# Patient Record
Sex: Female | Born: 1950 | Race: Black or African American | Hispanic: No | Marital: Single | Smoking: Never smoker
Health system: Southern US, Community
[De-identification: ages and names within clinical notes are randomized; demographics above are authoritative.]

## PROBLEM LIST (undated history)

## (undated) DIAGNOSIS — T7840XA Allergy, unspecified, initial encounter: Secondary | ICD-10-CM

## (undated) DIAGNOSIS — M1990 Unspecified osteoarthritis, unspecified site: Secondary | ICD-10-CM

## (undated) DIAGNOSIS — J45909 Unspecified asthma, uncomplicated: Secondary | ICD-10-CM

## (undated) DIAGNOSIS — E119 Type 2 diabetes mellitus without complications: Secondary | ICD-10-CM

## (undated) HISTORY — PX: BREAST SURGERY: SHX581

## (undated) HISTORY — DX: Allergy, unspecified, initial encounter: T78.40XA

## (undated) HISTORY — DX: Unspecified osteoarthritis, unspecified site: M19.90

## (undated) HISTORY — DX: Unspecified asthma, uncomplicated: J45.909

## (undated) HISTORY — DX: Type 2 diabetes mellitus without complications: E11.9

---

## 1999-03-12 ENCOUNTER — Encounter: Admission: RE | Admit: 1999-03-12 | Discharge: 1999-03-12 | Payer: Self-pay | Admitting: Gastroenterology

## 1999-03-12 ENCOUNTER — Encounter: Payer: Self-pay | Admitting: Gastroenterology

## 1999-05-24 ENCOUNTER — Other Ambulatory Visit: Admission: RE | Admit: 1999-05-24 | Discharge: 1999-05-24 | Payer: Self-pay | Admitting: Obstetrics and Gynecology

## 1999-11-25 ENCOUNTER — Encounter: Payer: Self-pay | Admitting: Obstetrics and Gynecology

## 1999-11-25 ENCOUNTER — Encounter: Admission: RE | Admit: 1999-11-25 | Discharge: 1999-11-25 | Payer: Self-pay | Admitting: Obstetrics and Gynecology

## 2000-07-03 ENCOUNTER — Other Ambulatory Visit: Admission: RE | Admit: 2000-07-03 | Discharge: 2000-07-03 | Payer: Self-pay | Admitting: Obstetrics and Gynecology

## 2000-11-12 ENCOUNTER — Encounter: Admission: RE | Admit: 2000-11-12 | Discharge: 2000-11-12 | Payer: Self-pay | Admitting: Obstetrics and Gynecology

## 2000-11-12 ENCOUNTER — Encounter: Payer: Self-pay | Admitting: Obstetrics and Gynecology

## 2001-11-15 ENCOUNTER — Encounter: Payer: Self-pay | Admitting: Obstetrics and Gynecology

## 2001-11-15 ENCOUNTER — Encounter: Admission: RE | Admit: 2001-11-15 | Discharge: 2001-11-15 | Payer: Self-pay | Admitting: Obstetrics and Gynecology

## 2001-11-19 ENCOUNTER — Encounter: Admission: RE | Admit: 2001-11-19 | Discharge: 2001-11-19 | Payer: Self-pay | Admitting: Obstetrics and Gynecology

## 2001-11-19 ENCOUNTER — Encounter: Payer: Self-pay | Admitting: Obstetrics and Gynecology

## 2001-12-07 ENCOUNTER — Other Ambulatory Visit: Admission: RE | Admit: 2001-12-07 | Discharge: 2001-12-07 | Payer: Self-pay | Admitting: Obstetrics and Gynecology

## 2002-05-05 HISTORY — PX: COLONOSCOPY: SHX174

## 2002-11-21 ENCOUNTER — Ambulatory Visit (HOSPITAL_COMMUNITY): Admission: RE | Admit: 2002-11-21 | Discharge: 2002-11-21 | Payer: Self-pay | Admitting: Gastroenterology

## 2002-11-22 ENCOUNTER — Encounter: Admission: RE | Admit: 2002-11-22 | Discharge: 2002-11-22 | Payer: Self-pay | Admitting: Obstetrics and Gynecology

## 2002-11-22 ENCOUNTER — Encounter: Payer: Self-pay | Admitting: Obstetrics and Gynecology

## 2003-06-07 ENCOUNTER — Other Ambulatory Visit: Admission: RE | Admit: 2003-06-07 | Discharge: 2003-06-07 | Payer: Self-pay | Admitting: Obstetrics & Gynecology

## 2003-12-01 ENCOUNTER — Encounter: Admission: RE | Admit: 2003-12-01 | Discharge: 2003-12-01 | Payer: Self-pay | Admitting: Obstetrics and Gynecology

## 2003-12-22 ENCOUNTER — Encounter: Admission: RE | Admit: 2003-12-22 | Discharge: 2003-12-22 | Payer: Self-pay | Admitting: Obstetrics and Gynecology

## 2004-06-27 ENCOUNTER — Other Ambulatory Visit: Admission: RE | Admit: 2004-06-27 | Discharge: 2004-06-27 | Payer: Self-pay | Admitting: Obstetrics and Gynecology

## 2004-09-24 ENCOUNTER — Ambulatory Visit (HOSPITAL_COMMUNITY): Admission: RE | Admit: 2004-09-24 | Discharge: 2004-09-24 | Payer: Self-pay | Admitting: Obstetrics and Gynecology

## 2004-09-24 ENCOUNTER — Encounter (INDEPENDENT_AMBULATORY_CARE_PROVIDER_SITE_OTHER): Payer: Self-pay | Admitting: *Deleted

## 2004-09-26 ENCOUNTER — Emergency Department (HOSPITAL_COMMUNITY): Admission: EM | Admit: 2004-09-26 | Discharge: 2004-09-26 | Payer: Self-pay | Admitting: Emergency Medicine

## 2005-01-23 ENCOUNTER — Encounter: Admission: RE | Admit: 2005-01-23 | Discharge: 2005-01-23 | Payer: Self-pay | Admitting: Obstetrics and Gynecology

## 2006-01-07 ENCOUNTER — Ambulatory Visit (HOSPITAL_COMMUNITY): Admission: RE | Admit: 2006-01-07 | Discharge: 2006-01-07 | Payer: Self-pay | Admitting: Otolaryngology

## 2006-01-13 ENCOUNTER — Encounter: Admission: RE | Admit: 2006-01-13 | Discharge: 2006-01-13 | Payer: Self-pay | Admitting: Otolaryngology

## 2006-02-06 ENCOUNTER — Ambulatory Visit: Payer: Self-pay | Admitting: Internal Medicine

## 2006-02-10 ENCOUNTER — Ambulatory Visit: Payer: Self-pay | Admitting: Internal Medicine

## 2006-02-25 ENCOUNTER — Encounter: Admission: RE | Admit: 2006-02-25 | Discharge: 2006-02-25 | Payer: Self-pay | Admitting: Obstetrics and Gynecology

## 2006-05-07 ENCOUNTER — Ambulatory Visit: Payer: Self-pay | Admitting: Internal Medicine

## 2006-05-11 ENCOUNTER — Ambulatory Visit (HOSPITAL_COMMUNITY): Admission: RE | Admit: 2006-05-11 | Discharge: 2006-05-11 | Payer: Self-pay | Admitting: Internal Medicine

## 2007-01-23 ENCOUNTER — Emergency Department (HOSPITAL_COMMUNITY): Admission: EM | Admit: 2007-01-23 | Discharge: 2007-01-23 | Payer: Self-pay | Admitting: Emergency Medicine

## 2007-02-14 ENCOUNTER — Emergency Department (HOSPITAL_COMMUNITY): Admission: EM | Admit: 2007-02-14 | Discharge: 2007-02-15 | Payer: Self-pay | Admitting: *Deleted

## 2007-02-17 ENCOUNTER — Emergency Department (HOSPITAL_COMMUNITY): Admission: EM | Admit: 2007-02-17 | Discharge: 2007-02-17 | Payer: Self-pay | Admitting: Emergency Medicine

## 2007-03-08 ENCOUNTER — Ambulatory Visit (HOSPITAL_COMMUNITY): Payer: Self-pay | Admitting: Psychiatry

## 2007-03-19 ENCOUNTER — Emergency Department (HOSPITAL_COMMUNITY): Admission: EM | Admit: 2007-03-19 | Discharge: 2007-03-19 | Payer: Self-pay | Admitting: Family Medicine

## 2007-03-22 ENCOUNTER — Ambulatory Visit (HOSPITAL_COMMUNITY): Payer: Self-pay | Admitting: Psychiatry

## 2007-05-10 ENCOUNTER — Ambulatory Visit (HOSPITAL_COMMUNITY): Payer: Self-pay | Admitting: Psychiatry

## 2007-07-12 ENCOUNTER — Ambulatory Visit (HOSPITAL_COMMUNITY): Payer: Self-pay | Admitting: Psychiatry

## 2007-10-11 ENCOUNTER — Ambulatory Visit (HOSPITAL_COMMUNITY): Payer: Self-pay | Admitting: Psychiatry

## 2008-01-31 ENCOUNTER — Ambulatory Visit (HOSPITAL_COMMUNITY): Payer: Self-pay | Admitting: Psychiatry

## 2008-05-31 ENCOUNTER — Ambulatory Visit (HOSPITAL_COMMUNITY): Payer: Self-pay | Admitting: Psychiatry

## 2010-05-26 ENCOUNTER — Encounter: Payer: Self-pay | Admitting: Obstetrics and Gynecology

## 2010-09-20 NOTE — Assessment & Plan Note (Signed)
[facility]                               PULMONARY OFFICE NOTE   NAME:Lindsey Bray, Lindsey Bray                      MRN:          [id number]  DATE:02/06/2006                            DOB:          1950-08-15    PROBLEM:  Pulmonary consultation with the kind request of Dr. Jenne Pane for this  60 year old woman who is concerned that she is seeing some pink, possibly  blood in mucus she clears from her throat.   HISTORY:  She says this has been going on at least two years. What she  describes is a foreign-body sensation low in her throat. She works at it  hard and eventually brings up a small lump the size of her fingernail which  she describes as mucus with a little red in it. She has only occasional  cough otherwise, which she considers minimal. She denies any reflux symptoms  or difficulties swallowing. She describes workup by Dr. Jenne Pane including a  chest x-ray she understands was normal and a rhinoscopy. She also had what  sounds like a pH probe. She had asthma as a child. Otherwise no respiratory  complaints. She has had no other bleeding. She has never smoked.   MEDICATIONS:  Mucinex which she was using irregularly.   No medication allergy.   REVIEW OF SYSTEMS:  Her weight has bene stable. She denies adenopathy,  fever, or rash. She has had a few night sweats she considers menopausal. She  jogs regularly with no shortness of breath. She denies any food hang up or  throat pain or any sense of aspiration. There has been no edema or change in  bowel or bladder.   PAST HISTORY:  Still has tonsils. No history of pneumonia. Asthma only as a  child. No history of acid peptic disease, anemia or transfusion. No history  of liver disease, kidney dysfunction, or other serious illness. Benign  breast lump resected as a teenager. No history of exposure to tuberculosis.   SOCIAL HISTORY:  She is not married. 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   FAMILY HISTORY:  Nobody with bleeding or clotting disorders. Father had  emphysema. Sister and mother had cancers.   OBJECTIVE:  Weight 140 pounds, BP 122/72, pulse regular/61, room-air  saturation 100% at rest. This is a well-developed, well-nourished, African-  American woman who looks fit and comfortable.  SKIN:  No rash on exposed areas.  ADENOPATHY:  None found at the neck, shoulders or axillae.  HEENT:  Voice quality normal. Cerumen obscures left TM; right ear is okay.  Nose and throat are clear. There is no stridor or thyromegaly.  CHEST:  Quiet, clear lung sounds. No cough or wheeze. No throat clearing  noted.  HEART:  Sounds regular without murmur or gallop.  ABDOMEN:  Without enlargement of liver or spleen.  EXTREMITIES:  Without cyanosis, clubbing, edema or telangiectases.   IMPRESSION:  Her concern is rather specific, describing small pieces of  mucus that seem to collect in her throat with a foreign-body sensation that  she has to mechanically work  at by coughing to clear. It is only with that  effort that she sees a little pink, suggesting that she may just be getting  a little capillary rupture as she brings up very small amounts of mucus. I  do not know if she is just sensitive in her throat or has a small  diverticulum that be might be seen either with a CT scan of her neck or a  with barium swallow. She has some Mucinex but has not been using it  effectively, and we discussed the possibility it might produce symptoms  enough that the problem would be manageable. For now, I will address this as  hemoptysis but think it is likely the hypopharynx, upper airway, or  esophagitis that is the source, and it will probably be benign in this  nonsmoker.   PLAN:  1. Increase Mucinex to b.i.d. on a regular basis as discussed. Try      increasing fluids and exposure to steam in shower.  2. We are getting a CT scan of her neck.  3. Schedule return in 3 weeks with  question of barium swallow or possibly      eventually bronchoscopy.   I appreciate the chance to meet her and would be happy to discuss her care.       Clinton D. Maple Hudson, MD, [facility], FACP      CDY/MedQ  DD:  02/08/2006  DT:  02/09/2006  Job #:  [id number]   cc:   Antony Contras, MD

## 2010-09-20 NOTE — Op Note (Signed)
NAME:  Lindsey Bray, BEDORE NO.:  [id number]   MEDICAL RECORD NO.:  [id number]          PATIENT TYPE:  AMB   LOCATION:  SDC                           FACILITY:  [facility]   PHYSICIAN:  Miguel Aschoff, M.D.       DATE OF BIRTH:  1950-07-13   DATE OF PROCEDURE:  09/24/2004  DATE OF DISCHARGE:                                 OPERATIVE REPORT   PREOPERATIVE DIAGNOSES:  Irregular vaginal bleeding, rule out endometrial  neoplasia.   POSTOPERATIVE DIAGNOSES:  Irregular vaginal bleeding, rule out endometrial  neoplasia.   PROCEDURE:  Cervical dilatation, hysteroscopy, uterine curettage.   SURGEON:  Miguel Aschoff, M.D.   ANESTHESIA:  General.   COMPLICATIONS:  None.   JUSTIFICATION:  The patient is 60 year old black female with history of  irregular vaginal bleeding initially thought to be due to anovulatory cycles  perimenopause but the patient did not respond to __________ gestational  therapy. She presents now to undergo hysteroscopy D&C to see if an etiology  for the bleeding can be established and corrected. The risks and benefits of  the procedure were discussed with the patient.   DESCRIPTION OF PROCEDURE:  The patient was taken to the operating room,  placed in supine position, . general anesthesia was administered without  difficulty. She was then placed in dorsolithotomy position, prepped and  draped in the usual sterile fashion.  After this was done, examination was  carried out which revealed normal external genitalia, normal Bartholin and  Skene's glands, normal urethra. The vaginal vault was without gross lesion.  The cervix had a small pin point os but was otherwise was without gross  lesion. The uterus was noted to be anterior, normal size and shape. The  adnexa revealed no masses. At this point, the cervix grasped with the  tenaculum. The endocervical canal was dilated with serial Pratt dilators  until a number 25 Pratt dilator could be passed then the hysteroscope  was  advanced under direct visualization.  No endocervical lesions were noted.  The cavity of the uterus appeared somewhat atrophic, no submucous myomas or  other lesions were noted within the confines of the uterus. At this point,  the hysteroscope was removed and curettage was carried out using a small  serrated curette. A small amount of tissue was obtained and sent for  histologic study this being inconsistent with the patient's atrophic  appearing endometrium. At this point with no other abnormalities being  noted, all instruments were removed. Hemostasis was barely achieved. The  patient reversed of the anesthetic and sent to the recovery room in  satisfactory condition.  The estimated blood loss was less than 10 mL, the  deficit of hysteroscopy was less than 80 mL.   PLAN:  Plan is for the patient to be discharged home.   DISCHARGE MEDICATIONS:  1.  Darvocet-N 100, 1 every 4 hours as needed for pain.  2.  Doxycycline 100 mg twice a day x3 days.   The patient is call for pathology report on Sep 26, 2004. She is to call if  there are any problems  such as fever, pain or heavy bleeding.       AR/MEDQ  D:  09/24/2004  T:  09/24/2004  Job:  [id number]

## 2010-09-20 NOTE — Op Note (Signed)
   NAME:  ADRINNE, Lindsey Bray                         ACCOUNT NO.:  [id number]   MEDICAL RECORD NO.:  [id number]                   PATIENT TYPE:  AMB   LOCATION:  ENDO                                 FACILITY:  [facility]   PHYSICIAN:  Danise Edge, M.D.                DATE OF BIRTH:  1950/09/28   DATE OF PROCEDURE:  11/21/2002  DATE OF DISCHARGE:                                 OPERATIVE REPORT   PROCEDURE:  Screening colonoscopy.   PROCEDURE INDICATIONS:  [name] is a 60 year old female born [birth year].  Lindsey Bray is scheduled to undergo her first screening colonoscopy  with polypectomy to prevent colon cancer.  Lindsey Bray was diagnosed  with colon cancer at age 78.   ENDOSCOPIST:  Danise Edge, M.D.   PREMEDICATION:  Versed 5 mg, Demerol 50 mg.   PROCEDURE:  After obtaining informed consent, [name] was placed in the  left lateral decubitus position.  I administered intravenous Demerol and  intravenous Versed to achieve conscious sedation for the procedure.  The  patient's blood pressure, oxygen saturation, and cardiac rhythm were  monitored throughout the procedure and documented in the medical record.   Anal inspection was normal.  Digital rectal exam was normal.  The Olympus  adjustable pediatric colonoscope was introduced into the rectum and easily  advanced to the cecum.  Colonic preparation for the exam today was  excellent.   Rectum normal.   Sigmoid colon and descending colon normal.   Splenic flexure normal.   Transverse colon normal.   Hepatic flexure normal.   Ascending colon normal.   Cecum and ileocecal valve normal.    ASSESSMENT:  Normal screening proctocolonoscopy to the cecum.  No endoscopic  evidence for the presence of colorectal neoplasia.   RECOMMENDATIONS:  Repeat colonoscopy in five to 10 years.                                               Danise Edge, M.D.    MJ/MEDQ  D:  11/21/2002  T:  11/21/2002  Job:   [id number]   cc:   Georgann Housekeeper, M.D.  [address]  [place]  [place] [address]  Fax: [phone]

## 2010-09-20 NOTE — Assessment & Plan Note (Signed)
[facility]                             PULMONARY OFFICE NOTE   NAME:Lindsey Bray, Lindsey Bray                      MRN:          [id number]  DATE:05/07/2006                            DOB:          Aug 04, 1950    PROBLEM:  Hemoptysis/blood from mouth.   HISTORY:  Since here in October, she says she still can clear a little  mucus from her throat after eating.  She has to work at it, and when  it comes up there may be a small streak of blood.  She has no problems  with her voice.  No difficulty swallowing.  No obvious sinus drainage.  Only occasional minor cough.  No recognized bleeding or adenopathy.  A  CT scan of the neck done without contrast on February 10, 2006 was normal.  IV contrast had not been given.  Standard barium swallow was recommended  if symptoms continued.   MEDICATIONS:  Mucinex.   NO MEDICATION ALLERGY.   OBJECTIVE:  Weight 145 pounds.  BP 130/72.  Pulse regular 63.  Room air  saturation 100%.  Nose and throat look clear.  Her oropharynx is clear and voice normal.  There is no stridor, neck vein distention or adenopathy.  HEART:  Sounds are regular without murmur.  LUNGS:  Clear.   IMPRESSION:  Blood from mouth.  It almost sounds as if she might be  working a little something up out of her diverticulum occasionally.   PLAN:  Barium swallow.  Call report.  I will see her again p.r.n. unless  the pattern changes, recognizing that she has already had appropriate  ENT evaluation.  I told her that about all we would be able to do would  be to watch this conservatively, but that it was important for her to  notice any progression or additional findings.     Clinton D. Maple Hudson, MD, [facility], FACP  Electronically Signed    CDY/MedQ  DD: 05/09/2006  DT: 05/09/2006  Job #: [id number]   cc:   Antony Contras, MD

## 2011-02-12 LAB — I-STAT 8, (EC8 V) (CONVERTED LAB)
BUN: 9
Bicarbonate: 31.9 — ABNORMAL HIGH
Chloride: 102
HCT: 45
Sodium: 139
TCO2: 33
pCO2, Ven: 52.3 — ABNORMAL HIGH

## 2011-02-13 LAB — POCT I-STAT CREATININE
Creatinine, Ser: 1.5 — ABNORMAL HIGH
Operator id: 151321
Operator id: 196461

## 2011-02-13 LAB — I-STAT 8, (EC8 V) (CONVERTED LAB)
BUN: 15
Bicarbonate: 27.7 — ABNORMAL HIGH
Chloride: 100
HCT: 47 — ABNORMAL HIGH
Hemoglobin: 15.6 — ABNORMAL HIGH
Hemoglobin: 16 — ABNORMAL HIGH
Operator id: 151321
Operator id: 196461
Potassium: 3.9
Sodium: 137
TCO2: 36
TCO2: 29
pCO2, Ven: 46.6
pH, Ven: 7.382 — ABNORMAL HIGH

## 2011-02-13 LAB — CBC
HCT: 40.2
MCHC: 33.6
Platelets: 266
RBC: 4.8
RDW: 13.8

## 2011-02-13 LAB — DIFFERENTIAL
Basophils Absolute: 0
Basophils Relative: 1
Eosinophils Absolute: 0.1
Lymphs Abs: 1.7
Neutrophils Relative %: 65

## 2011-02-13 LAB — POCT CARDIAC MARKERS
CKMB, poc: 2.2
Troponin i, poc: <0.05

## 2011-04-09 ENCOUNTER — Other Ambulatory Visit: Payer: Self-pay | Admitting: Obstetrics and Gynecology

## 2013-08-03 ENCOUNTER — Other Ambulatory Visit: Payer: Self-pay | Admitting: Obstetrics and Gynecology

## 2015-03-09 ENCOUNTER — Ambulatory Visit (INDEPENDENT_AMBULATORY_CARE_PROVIDER_SITE_OTHER): Payer: 59

## 2015-03-09 ENCOUNTER — Ambulatory Visit (INDEPENDENT_AMBULATORY_CARE_PROVIDER_SITE_OTHER): Payer: 59 | Admitting: Physician Assistant

## 2015-03-09 VITALS — BP 132/74 | HR 62 | Temp 97.7°F | Resp 16 | Ht 64.0 in | Wt 152.8 lb

## 2015-03-09 DIAGNOSIS — R05 Cough: Secondary | ICD-10-CM

## 2015-03-09 DIAGNOSIS — Z0000 Encounter for general adult medical examination without abnormal findings: Secondary | ICD-10-CM

## 2015-03-09 DIAGNOSIS — R0602 Shortness of breath: Secondary | ICD-10-CM | POA: Diagnosis not present

## 2015-03-09 DIAGNOSIS — Z803 Family history of malignant neoplasm of breast: Secondary | ICD-10-CM | POA: Diagnosis not present

## 2015-03-09 DIAGNOSIS — R059 Cough, unspecified: Secondary | ICD-10-CM

## 2015-03-09 MED ORDER — AZITHROMYCIN 250 MG PO TABS: ORAL_TABLET | ORAL | Status: DC

## 2015-03-09 MED ORDER — ALBUTEROL SULFATE HFA 108 (90 BASE) MCG/ACT IN AERS: 2.0000 | INHALATION_SPRAY | RESPIRATORY_TRACT | Status: DC | PRN

## 2015-03-09 MED ORDER — HYDROCOD POLST-CPM POLST ER 10-8 MG/5ML PO SUER: 5.0000 mL | Freq: Two times a day (BID) | ORAL | Status: DC | PRN

## 2015-03-09 NOTE — Progress Notes (Signed)
Subjective:   Patient ID: Lindsey Bray, female     DOB: [birth year], 64 y.o.    MRN: [id number]  PCP: No primary care provider on file.  Chief Complaint  Patient presents with  . Cough    Has last about 4 weeks, mostly a dry cough   . Shortness of Breath    Mostly during the night     HPI  Presents for evaluation of cough and shortness of breath x 4 weeks.  She first noticed some mild labored breathing and SOB this summer while mowing her grass, but she didn't think anything of it. Her cough started about a month ago. Initially it was dry, but it has gradually gotten worse and is now productive with clear mucous. Associated with sneezing and "rattling" in her chest for the past 3 weeks. Her symptoms are worse at night while lying down. She now sleeps with multiple pillows at night for relief. States this is "not like her" to feel winded and SOB.  Patient had asthma as a child and young adult. Was able to manage it without an inhaler and eventually "grew out" of it. As a young adult, she also got immunotherapy, but the shots got too expensive so she stopped.  Patient is otherwise healthy and very active. She is an avid runner and enjoys working in the yard. She has continued running despite her symptoms, but last week she had to stop because she felt like her lungs were "going to give out".  She does not like to take medications or get shots. Her last flu vaccination was 40 years ago.  She states the only doctor she sees is her OB-GYN, Dr. Harrington Challenger, for yearly mammograms given her strong family history of breast cancer. Patient had surgery at age 19 to remove benign breast tumors from her right breast. Has never had genetic testing. She was not able to see Dr. Harrington Challenger this year d/t switching insurance policies and not having a PCP referral. Would like to get this today.  Patient had her first colonoscopy at 77, is due for another screening.  Retired, single, lives in [place]. Has a 63 yo son  who is married, no grandchildren yet.  No other concerns on today's visit.    Prior to Admission medications   Not on File     No Known Allergies   There are no active problems to display for this patient.    Family History  Problem Relation Age of Onset  . Cancer Mother 66    colon  . COPD Father   . Heart disease Father   . Cancer Sister 88    breast  . Cancer Sister 77    breast; had a lumpectomy and is on tamoxifen     Social History   Social History  . Marital Status: Single    Spouse Name: N/A  . Number of Children: N/A  . Years of Education: N/A   Occupational History  . Not on file.   Social History Main Topics  . Smoking status: Never Smoker   . Smokeless tobacco: Not on file  . Alcohol Use: Not on file  . Drug Use: Not on file  . Sexual Activity: Not on file   Other Topics Concern  . Not on file   Social History Narrative  . No narrative on file        Review of Systems Constitutional: Negative for fever, chills, activity change and fatigue.  HENT: Positive for  sneezing. Negative for congestion, ear pain, hearing loss, rhinorrhea, sinus pressure, sore throat and tinnitus.  Respiratory: Positive for cough (clear mucous) and shortness of breath. Negative for wheezing.  Cardiovascular: Negative for chest pain, palpitations and leg swelling.  Gastrointestinal: Negative for nausea, vomiting and diarrhea.  Allergic/Immunologic: Positive for environmental allergies (seasonal).  Neurological: Negative for weakness, light-headedness and headaches.        Objective:  Physical Exam  Constitutional: She is oriented to person, place, and time. Vital signs are normal. She appears well-developed and well-nourished. She is active and cooperative. No distress.  BP 132/74 mmHg  Pulse 62  Temp(Src) 97.7 F (36.5 C) (Oral)  Resp 16  Ht 5\' 4"  (1.626 m)  Wt 152 lb 12.8 oz (69.31 kg)  BMI 26.22 kg/m2  SpO2 98%  HENT:  Head: Normocephalic and  atraumatic.  Right Ear: Hearing normal.  Left Ear: Hearing normal.  Eyes: Conjunctivae are normal. No scleral icterus.  Neck: Normal range of motion. Neck supple. No thyromegaly present.  Cardiovascular: Normal rate, regular rhythm and normal heart sounds.   Pulses:      Radial pulses are 2+ on the right side, and 2+ on the left side.  Pulmonary/Chest: Effort normal and breath sounds normal.  Lymphadenopathy:       Head (right side): No tonsillar, no preauricular, no posterior auricular and no occipital adenopathy present.       Head (left side): No tonsillar, no preauricular, no posterior auricular and no occipital adenopathy present.    She has no cervical adenopathy.       Right: No supraclavicular adenopathy present.       Left: No supraclavicular adenopathy present.  Neurological: She is alert and oriented to person, place, and time. No sensory deficit.  Skin: Skin is warm, dry and intact. No rash noted. No cyanosis or erythema. Nails show no clubbing.  Psychiatric: She has a normal mood and affect. Her speech is normal and behavior is normal.   Office Spirometry Results: normal, but not reproducible. FEV1: 1.86 liters FVC: 2.54 liters FEV1/FVC: 73.2 % FVC  % Predicted: 113 liters FEV % Predicted: 73.2 liters FeF 25-75: 1.35 liters FeF 25-75 % Predicted: 77   CXR: Preliminary reading By Harrison Mons, PA-C. Increased markings in RIGHT base.     Assessment & Plan:  1. Cough Cover for possible bacterial etiology. Await radiology reading of CXR (patient elected not to wait). Rest, fluids. - DG Chest 2 View; Future - azithromycin (ZITHROMAX) 250 MG tablet; Take 2 tabs PO x 1 dose, then 1 tab PO QD x 4 days  Dispense: 6 tablet; Refill: 0 - chlorpheniramine-HYDROcodone (TUSSIONEX PENNKINETIC ER) 10-8 MG/5ML SUER; Take 5 mLs by mouth every 12 (twelve) hours as needed for cough.  Dispense: 100 mL; Refill: 0  2. SOB (shortness of breath) She has a history of asthma and allergies,  and I suspect that she's experiencing symptoms from these as opposed to something more ominous.Normal spirometry and normal lung exam. Await radiology reading. Trial of albuterol. Return if symptoms persist for additional evaluation. - DG Chest 2 View; Future - albuterol (PROVENTIL HFA;VENTOLIN HFA) 108 (90 BASE) MCG/ACT inhaler; Inhale 2 puffs into the lungs every 4 (four) hours as needed for wheezing or shortness of breath (cough, shortness of breath or wheezing.).  Dispense: 1 Inhaler; Refill: 1  3. Family history of breast cancer in sister 24. Health care maintenance Refer to her GYN (her new insurance requires a referral) for continued health maintenance and  careful screening for breast cancer. - Ambulatory referral to Gynecology   Fara Chute, PA-C Physician Assistant-Certified [facility]

## 2015-03-09 NOTE — Progress Notes (Signed)
Subjective:    Patient ID: Lindsey Bray, female    DOB: March 31, 1951, 64 y.o.   MRN: [id number]  Chief Complaint  Patient presents with  . Cough    Has last about 4 weeks, mostly a dry cough   . Shortness of Breath    Mostly during the night    HPI Patient presents today for evaluation of cough and shortness of breath x 4 weeks.   She first noticed some mild labored breathing and SOB this summer while mowing her grass, but she didn't think anything of it. Her cough started about a month ago. Initially it was dry, but it has gradually gotten worse and is now productive with clear mucous. Associated with sneezing and "rattling" in her chest for the past 3 weeks. Her symptoms are worse at night while lying down. She now sleeps with multiple pillows at night for relief. States this is "not like her" to feel winded and SOB.   Patient had asthma as a child and young adult. Was able to manage it without an inhaler and eventually "grew out" of it. As a young adult, she also got immunotherapy, but the shots got too expensive so she stopped.   Patient is otherwise healthy and very active. She is an avid runner and enjoys working in the yard. She has continued running despite her symptoms, but last week she had to stop because she felt like her lungs were "going to give out".    She does not like to take medications or get shots. Her last flu vaccination was 40 years ago.  She states the only doctor she sees is her OB-GYN, Dr. Harrington Challenger, for yearly mammograms given her strong family history of breast cancer. Patient had surgery at age 49 to remove benign breast tumors from her right breast. Has never had genetic testing. She was not able to see Dr. Harrington Challenger this year d/t switching insurance policies and not having a PCP referral. Would like to get this today.    Patient had her first colonoscopy at 38, is due for another screening.   Retired, single, lives in [place]. Has a 2 yo son who is married, no  grandchildren yet.   No other concerns on today's visit.    Review of Systems  Constitutional: Negative for fever, chills, activity change and fatigue.  HENT: Positive for sneezing. Negative for congestion, ear pain, hearing loss, rhinorrhea, sinus pressure, sore throat and tinnitus.   Respiratory: Positive for cough (clear mucous) and shortness of breath. Negative for wheezing.   Cardiovascular: Negative for chest pain, palpitations and leg swelling.  Gastrointestinal: Negative for nausea, vomiting and diarrhea.  Allergic/Immunologic: Positive for environmental allergies (seasonal).  Neurological: Negative for weakness, light-headedness and headaches.   Patient Active Problem List   Diagnosis Date Noted  . Family history of breast cancer in sister 03/09/2015   Family History  Problem Relation Age of Onset  . Cancer Mother 60    colon  . COPD Father   . Heart disease Father   . Cancer Sister 41    breast  . Cancer Sister 53    breast; had a lumpectomy and is on tamoxifen   Social History   Social History  . Marital Status: Single    Spouse Name: N/A  . Number of Children: 1  . Years of Education: N/A   Occupational History  . Not on file.   Social History Main Topics  . Smoking status: Never Smoker   .  Smokeless tobacco: Not on file  . Alcohol Use: Not on file  . Drug Use: Not on file  . Sexual Activity: Not on file   Other Topics Concern  . Not on file   Social History Narrative   Lives by herself in [place]; has a 85 yo son, who is married. No grandchildren.   Prior to Admission medications   Not on File   No Known Allergies    Objective:   Physical Exam  Constitutional: She is oriented to person, place, and time. She appears well-developed and well-nourished. No distress.  BP 132/74 mmHg  Pulse 62  Temp(Src) 97.7 F (36.5 C) (Oral)  Resp 16  Ht 5\' 4"  (1.626 m)  Wt 152 lb 12.8 oz (69.31 kg)  BMI 26.22 kg/m2  SpO2 98%  HENT:  Head:  Normocephalic and atraumatic.  Right Ear: External ear normal.  Left Ear: External ear normal.  Nose: Nose normal.  Mouth/Throat: Oropharynx is clear and moist. No oropharyngeal exudate.  Eyes: EOM are normal. No scleral icterus.  Neck: Neck supple.  Cardiovascular: Normal rate, regular rhythm, normal heart sounds and intact distal pulses.  Exam reveals no gallop and no friction rub.   No murmur heard. Pulmonary/Chest: Effort normal and breath sounds normal. No respiratory distress. She has no wheezes. She has no rales.  Musculoskeletal: She exhibits no edema.  Lymphadenopathy:    She has no cervical adenopathy.  Neurological: She is alert and oriented to person, place, and time.  Skin: Skin is warm and dry. She is not diaphoretic. No erythema.  Psychiatric: She has a normal mood and affect. Her behavior is normal. Judgment and thought content normal.   DG Chest 2 View x-ray shows no consolidation or thickening. Heavy lung markings. Normal bony anatomy.     Assessment & Plan:  1. Cough - DG Chest 2 View; Future - azithromycin (ZITHROMAX) 250 MG tablet; Take 2 tabs PO x 1 dose, then 1 tab PO QD x 4 days  Dispense: 6 tablet; Refill: 0 - chlorpheniramine-HYDROcodone (TUSSIONEX PENNKINETIC ER) 10-8 MG/5ML SUER; Take 5 mLs by mouth every 12 (twelve) hours as needed for cough.  Dispense: 100 mL; Refill: 0  2. SOB (shortness of breath) - Re-initiate albuterol inhaler as needed for cough and SOB.  - DG Chest 2 View; Future - albuterol (PROVENTIL HFA;VENTOLIN HFA) 108 (90 BASE) MCG/ACT inhaler; Inhale 2 puffs into the lungs every 4 (four) hours as needed for wheezing or shortness of breath (cough, shortness of breath or wheezing.).  Dispense: 1 Inhaler; Refill: 1  3. Family history of breast cancer in sister - Ambulatory referral to Dr. Harrington Challenger for monitoring and possible genetic testing.   4. Health care maintenance - Ambulatory referral to Gynecology

## 2015-03-09 NOTE — Patient Instructions (Signed)
The spirometry was normal. If your symptoms persist after the treatment, please return for additional evaluation. I will contact you as soon as I get the radiologist's report.

## 2015-10-27 ENCOUNTER — Encounter (HOSPITAL_BASED_OUTPATIENT_CLINIC_OR_DEPARTMENT_OTHER): Payer: Self-pay | Admitting: Emergency Medicine

## 2015-10-27 ENCOUNTER — Emergency Department (HOSPITAL_BASED_OUTPATIENT_CLINIC_OR_DEPARTMENT_OTHER)
Admission: EM | Admit: 2015-10-27 | Discharge: 2015-10-28 | Disposition: A | Discharge status: Home / Self Care | Payer: BLUE CROSS/BLUE SHIELD | Attending: Emergency Medicine | Admitting: Emergency Medicine

## 2015-10-27 DIAGNOSIS — J45909 Unspecified asthma, uncomplicated: Secondary | ICD-10-CM | POA: Insufficient documentation

## 2015-10-27 DIAGNOSIS — H6121 Impacted cerumen, right ear: Secondary | ICD-10-CM | POA: Diagnosis not present

## 2015-10-27 DIAGNOSIS — H9202 Otalgia, left ear: Secondary | ICD-10-CM | POA: Diagnosis present

## 2015-10-27 NOTE — ED Notes (Signed)
Patient reports that she is having pain and pressure to her left ear

## 2015-10-28 ENCOUNTER — Encounter (HOSPITAL_BASED_OUTPATIENT_CLINIC_OR_DEPARTMENT_OTHER): Payer: Self-pay | Admitting: Emergency Medicine

## 2015-10-28 NOTE — ED Provider Notes (Signed)
CSN: [id number]     Arrival date & time 10/27/15  2113 History  By signing my name below, I, Lindsey Bray, attest that this documentation has been prepared under the direction and in the presence of Dotty Gonzalo, MD . Electronically Signed: Rowan Bray, Scribe. 10/28/2015. 12:25 AM.   Chief Complaint  Patient presents with  . Otalgia    Patient is a 65 y.o. female presenting with ear pain. The history is provided by the patient. No language interpreter was used.  Otalgia Location:  Left Behind ear:  No abnormality Quality:  Pressure Severity:  Moderate Onset quality:  Gradual Timing:  Constant Progression:  Worsening Chronicity:  New Context: not direct blow   Relieved by:  Nothing Worsened by:  Nothing tried Ineffective treatments:  None tried Associated symptoms: no fever   Risk factors: no recent travel    HPI Comments:  Lindsey Bray is a 65 y.o. female who presents to the Emergency Department complaining of moderate, constant left ear pain and pressure. Nurse flushed ear prior to exam which provided relief. No other symptoms or complaints at this time.   Past Medical History  Diagnosis Date  . Asthma     as a child and young adult  . Allergy     as a child and young adult   Past Surgical History  Procedure Laterality Date  . Breast surgery Right 22 (age 62)    benign breast tumors   Family History  Problem Relation Age of Onset  . Cancer Mother 57    colon  . COPD Father   . Heart disease Father   . Cancer Sister 39    breast  . Cancer Sister 1    breast; had a lumpectomy and is on tamoxifen   Social History  Substance Use Topics  . Smoking status: Never Smoker   . Smokeless tobacco: None  . Alcohol Use: No   OB History    No data available     Review of Systems  Constitutional: Negative for fever.  HENT: Positive for ear pain.   All other systems reviewed and are negative.   Allergies  Review of patient's allergies indicates no  known allergies.  Home Medications   Prior to Admission medications   Medication Sig Start Date End Date Taking? Authorizing Provider  albuterol (PROVENTIL HFA;VENTOLIN HFA) 108 (90 BASE) MCG/ACT inhaler Inhale 2 puffs into the lungs every 4 (four) hours as needed for wheezing or shortness of breath (cough, shortness of breath or wheezing.). 03/09/15   Chelle Jeffery, PA-C  azithromycin (ZITHROMAX) 250 MG tablet Take 2 tabs PO x 1 dose, then 1 tab PO QD x 4 days 03/09/15   Harrison Mons, PA-C  chlorpheniramine-HYDROcodone (TUSSIONEX PENNKINETIC ER) 10-8 MG/5ML SUER Take 5 mLs by mouth every 12 (twelve) hours as needed for cough. 03/09/15   Chelle Jeffery, PA-C   BP 156/79 mmHg  Pulse 56  Temp(Src) 97.8 F (36.6 C) (Oral)  Resp 16  Ht 5\' 1"  (1.549 m)  Wt 149 lb (67.586 kg)  BMI 28.17 kg/m2  SpO2 100%   Physical Exam  Constitutional: She is oriented to person, place, and time. She appears well-developed and well-nourished. No distress.  HENT:  Right Ear: Tympanic membrane, external ear and ear canal normal.  Left Ear: Tympanic membrane, external ear and ear canal normal.  Mouth/Throat: Oropharynx is clear and moist and mucous membranes are normal.  TMs intact  Eyes: EOM are normal. Pupils are equal, round, and  reactive to light.  Neck: Normal range of motion. Neck supple.  Cardiovascular: Normal rate and regular rhythm.   Pulmonary/Chest: Effort normal and breath sounds normal. No respiratory distress. She has no wheezes.  Abdominal: Soft. Bowel sounds are normal. There is no tenderness.  Musculoskeletal: Normal range of motion.  Neurological: She is alert and oriented to person, place, and time.  Skin: Skin is warm and dry.  Psychiatric: She has a normal mood and affect.  Nursing note and vitals reviewed.   ED Course  Procedures  DIAGNOSTIC STUDIES:  Oxygen Saturation is 100% on RA, normal by my interpretation.    COORDINATION OF CARE:  12:21 AM Recommended ear wax  resolving drops. Discussed treatment plan with pt at bedside and pt agreed to plan.  Labs Review Labs Reviewed - No data to display  Imaging Review No results found. I have personally reviewed and evaluated these images and lab results as part of my medical decision-making.   EKG Interpretation None      MDM   Final diagnoses:  None   Filed Vitals:   10/27/15 2121 10/27/15 2355  BP: 164/81 156/79  Pulse: 61 56  Temp: 97.8 F (36.6 C)   Resp: 18 16   Results for orders placed or performed during the hospital encounter of 02/17/07  I-stat 8 [facility] V)  Result Value Ref Range   Operator id [id number]    Sodium 139    Potassium 3.9    Chloride 102    BUN 9    Glucose, Bld 97    pH, Ven 7.393 (H)    pCO2, Ven 52.3 (H)    Bicarbonate 31.9 (H)    TCO2 33    Acid-Base Excess 5.0 (H)    HCT 45.0    Hemoglobin 15.3 (H)    Sample type VENOUS    No results found.   Filed Vitals:   10/27/15 2121 10/27/15 2355  BP: 164/81 156/79  Pulse: 61 56  Temp: 97.8 F (36.6 C)   Resp: 18 16    Ears irrigated and patient is feeling no pain post irrigation.   Recommend debrox drops to keep ears cerumen free.  Strict return precautions given  I personally performed the services described in this documentation, which was scribed in my presence. The recorded information has been reviewed and is accurate.     Veatrice Kells, MD 10/28/15 [phone]

## 2015-10-28 NOTE — Discharge Instructions (Signed)
Ear Drops, Adult debrox or cerumex You have been diagnosed with a condition requiring you to put drops of medicine into your outer ear. HOME CARE INSTRUCTIONS   Put drops in the affected ear as instructed. After putting the drops in, you will need to lie down with the affected ear facing up for ten minutes so the drops will remain in the ear canal and run down and fill the canal. Continue using the ear drops for as long as directed by your health care provider.  Prior to getting up, put a cotton ball gently in your ear canal. Leave enough of the cotton ball out so it can be easily removed. Do not attempt to push this down into the canal with a cotton-tipped swab or other instrument.  Do not irrigate or wash out your ears if you have had a perforated eardrum or mastoid surgery, or unless instructed to do so by your health care provider.  Keep appointments with your health care provider as instructed.  Finish all medicine, or use for the length of time prescribed by your health care provider. Continue the drops even if your problem seems to be doing well after a couple days, or continue as instructed. SEEK MEDICAL CARE IF:  You become worse or develop increasing pain.  You notice any unusual drainage from your ear (particularly if the drainage has a bad smell).  You develop hearing difficulties.  You experience a serious form of dizziness in which you feel as if the room is spinning, and you feel nauseated (vertigo).  The outside of your ear becomes red or swollen or both. This may be a sign of an allergic reaction. MAKE SURE YOU:   Understand these instructions.  Will watch your condition.  Will get help right away if you are not doing well or get worse.   This information is not intended to replace advice given to you by your health care provider. Make sure you discuss any questions you have with your health care provider.   Document Released: 04/15/2001 Document Revised: 05/12/2014  Document Reviewed: 11/16/2012 Elsevier Interactive Patient Education [employer].

## 2016-06-11 ENCOUNTER — Other Ambulatory Visit: Payer: Self-pay | Admitting: Obstetrics & Gynecology

## 2016-06-13 ENCOUNTER — Other Ambulatory Visit: Payer: Self-pay | Admitting: Obstetrics & Gynecology

## 2016-06-13 DIAGNOSIS — E2839 Other primary ovarian failure: Secondary | ICD-10-CM

## 2016-06-13 LAB — CYTOLOGY - PAP

## 2016-10-07 DIAGNOSIS — M65311 Trigger thumb, right thumb: Secondary | ICD-10-CM | POA: Diagnosis not present

## 2016-11-04 DIAGNOSIS — M65311 Trigger thumb, right thumb: Secondary | ICD-10-CM | POA: Diagnosis not present

## 2017-03-05 DIAGNOSIS — H5203 Hypermetropia, bilateral: Secondary | ICD-10-CM | POA: Diagnosis not present

## 2017-04-10 DIAGNOSIS — M65311 Trigger thumb, right thumb: Secondary | ICD-10-CM | POA: Diagnosis not present

## 2017-05-05 HISTORY — PX: OTHER SURGICAL HISTORY: SHX169

## 2017-05-12 DIAGNOSIS — M65311 Trigger thumb, right thumb: Secondary | ICD-10-CM | POA: Diagnosis not present

## 2017-05-22 DIAGNOSIS — M6530 Trigger finger, unspecified finger: Secondary | ICD-10-CM | POA: Insufficient documentation

## 2017-06-16 DIAGNOSIS — Z1231 Encounter for screening mammogram for malignant neoplasm of breast: Secondary | ICD-10-CM | POA: Diagnosis not present

## 2018-05-07 DIAGNOSIS — M25512 Pain in left shoulder: Secondary | ICD-10-CM | POA: Insufficient documentation

## 2018-05-07 DIAGNOSIS — M19012 Primary osteoarthritis, left shoulder: Secondary | ICD-10-CM | POA: Diagnosis not present

## 2018-05-28 DIAGNOSIS — M19012 Primary osteoarthritis, left shoulder: Secondary | ICD-10-CM | POA: Diagnosis not present

## 2018-06-18 DIAGNOSIS — M25512 Pain in left shoulder: Secondary | ICD-10-CM | POA: Diagnosis not present

## 2018-06-26 DIAGNOSIS — M25512 Pain in left shoulder: Secondary | ICD-10-CM | POA: Diagnosis not present

## 2018-07-05 DIAGNOSIS — S43432A Superior glenoid labrum lesion of left shoulder, initial encounter: Secondary | ICD-10-CM | POA: Diagnosis not present

## 2018-07-05 DIAGNOSIS — M19012 Primary osteoarthritis, left shoulder: Secondary | ICD-10-CM | POA: Diagnosis not present

## 2018-07-13 DIAGNOSIS — M25512 Pain in left shoulder: Secondary | ICD-10-CM | POA: Diagnosis not present

## 2018-08-03 DIAGNOSIS — Z01419 Encounter for gynecological examination (general) (routine) without abnormal findings: Secondary | ICD-10-CM | POA: Diagnosis not present

## 2018-08-03 DIAGNOSIS — Z124 Encounter for screening for malignant neoplasm of cervix: Secondary | ICD-10-CM | POA: Diagnosis not present

## 2018-08-03 DIAGNOSIS — Z6830 Body mass index (BMI) 30.0-30.9, adult: Secondary | ICD-10-CM | POA: Diagnosis not present

## 2018-08-03 DIAGNOSIS — Z1231 Encounter for screening mammogram for malignant neoplasm of breast: Secondary | ICD-10-CM | POA: Diagnosis not present

## 2018-08-27 ENCOUNTER — Other Ambulatory Visit: Payer: Self-pay

## 2018-08-27 DIAGNOSIS — R0602 Shortness of breath: Secondary | ICD-10-CM

## 2018-09-13 ENCOUNTER — Other Ambulatory Visit: Payer: Self-pay

## 2018-09-13 ENCOUNTER — Telehealth: Payer: Self-pay | Admitting: *Deleted

## 2018-09-13 ENCOUNTER — Telehealth (INDEPENDENT_AMBULATORY_CARE_PROVIDER_SITE_OTHER): Payer: Medicare HMO | Admitting: Family Medicine

## 2018-09-13 DIAGNOSIS — J4520 Mild intermittent asthma, uncomplicated: Secondary | ICD-10-CM | POA: Diagnosis not present

## 2018-09-13 DIAGNOSIS — Z800 Family history of malignant neoplasm of digestive organs: Secondary | ICD-10-CM | POA: Insufficient documentation

## 2018-09-13 DIAGNOSIS — Z780 Asymptomatic menopausal state: Secondary | ICD-10-CM

## 2018-09-13 DIAGNOSIS — Z1211 Encounter for screening for malignant neoplasm of colon: Secondary | ICD-10-CM

## 2018-09-13 DIAGNOSIS — Z7689 Persons encountering health services in other specified circumstances: Secondary | ICD-10-CM

## 2018-09-13 MED ORDER — ALBUTEROL SULFATE HFA 108 (90 BASE) MCG/ACT IN AERS: 2.0000 | INHALATION_SPRAY | RESPIRATORY_TRACT | 1 refills | Status: DC | PRN

## 2018-09-13 NOTE — Telephone Encounter (Signed)
09/13/2018 - PATIENT HAD A VIRTUAL VISIT WITH DR. Benay Spice ON Monday (09/13/2018). DR. Benay Spice HAS REQUESTED PATIENT HAVE AN ANNUAL MEDICARE WELLNESS VISIT IN 6 MONTHS. BEST PHONE [phone] [place]

## 2018-09-13 NOTE — Progress Notes (Signed)
Reestablishing pt, She is needing a refill on the albuterol. She is also seeing a orthopedic for the pain she has in her shoulder. She is taking motrin 800 and using voltaren gel for the pain.

## 2018-09-13 NOTE — Progress Notes (Signed)
Virtual Visit Note  I connected with patient on 09/13/18 at 1030am by phone and verified that I am speaking with the correct person using two identifiers. Lindsey Bray is currently located at home and patient is currently with them during visit. The provider, Rutherford Guys, MD is located in their office at time of visit.  I discussed the limitations, risks, security and privacy concerns of performing an evaluation and management service by telephone and the availability of in person appointments. I also discussed with the patient that there may be a patient responsible charge related to this service. The patient expressed understanding and agreed to proceed.   CC: establish care  HPI ? Seeing Dr Stann Mainland for left shoulder Takes ibuprofen prn, also uses diclofenac gel  Mother died of colon cancer at age 93 Patient reports normal colonoscopy at age 59 Denies any n/v/abd pain/blaoting/changes in stool/melena or BRRPR  Se[facility] Stann Mainland at [facility] Menopause at age 20 Denies any smoking Last mammogram March 2020, normal, gets annually Two sisters with breast cancer  Asthma, mild intermittent Triggers: seasonal allergies, mold, dust, dogs Uses only albuterol rarely Denies any current issues (SOB, cough, chest tightness, wheezing), just needs refill Has not had pneumonia vaccines Has had flu vaccine in the past but afraid due getting sick in the past  No Known Allergies  Prior to Admission medications   Medication Sig Start Date End Date Taking? Authorizing Provider  albuterol (PROVENTIL HFA;VENTOLIN HFA) 108 (90 BASE) MCG/ACT inhaler Inhale 2 puffs into the lungs every 4 (four) hours as needed for wheezing or shortness of breath (cough, shortness of breath or wheezing.). 03/09/15   Harrison Mons, PA  diclofenac sodium (VOLTAREN) 1 % GEL APP 4 GRAMS AA EXT QID 05/07/18   [provider]  Ibuprofen-Famotidine (DUEXIS) 800-26.6 MG TABS Duexis 800 mg-26.6 mg tablet  Take 1 tablet 3 times a day by oral route.    [provider]    Past Medical History:  Diagnosis Date  . Allergy    as a child and young adult  . Asthma    as a child and young adult    Past Surgical History:  Procedure Laterality Date  . BREAST SURGERY Right 32 (age 52)   benign breast tumors    Social History   Tobacco Use  . Smoking status: Never Smoker  Substance Use Topics  . Alcohol use: No    Alcohol/week: 0.0 standard drinks    Family History  Problem Relation Age of Onset  . Cancer Mother 37       colon  . COPD Father   . Heart disease Father   . Cancer Sister 64       breast  . Cancer Sister 43       breast; had a lumpectomy and is on tamoxifen    ROS Per hpi  Objective  Vitals as reported by the patient: none   ASSESSMENT and PLAN  1. Mild intermittent asthma without complication - albuterol (VENTOLIN HFA) 108 (90 Base) MCG/ACT inhaler; Inhale 2 puffs into the lungs every 4 (four) hours as needed for wheezing or shortness of breath (cough, shortness of breath or wheezing.).  2. Screening for colon cancer - Ambulatory referral to Gastroenterology  3. Family history of colon cancer in mother - Ambulatory referral to Gastroenterology  4. Postmenopausal estrogen deficiency - DG Bone Density; Future  5. Encounter to establish care  Other orders - diclofenac sodium (VOLTAREN) 1 % GEL;  APP 4 GRAMS AA EXT QID - Ibuprofen-Famotidine (DUEXIS) 800-26.6 MG TABS; Duexis 800 mg-26.6 mg tablet  Take 1 tablet 3 times a day by oral route.  Patient at baseline health. Has no acute concerns today. Albuterol refilled. PMH, PSH, meds, allergies, Fhx, Shx, reviewed with patient today.  FOLLOW-UP: 6 months for Medicare AWV   The above assessment and management plan was discussed with the patient. The patient verbalized understanding of and has agreed to the management plan. Patient is aware to call the clinic if symptoms persist or worsen.  Patient is aware when to return to the clinic for a follow-up visit. Patient educated on when it is appropriate to go to the emergency department.    I provided 16 minutes of non-face-to-face time during this encounter.  Rutherford Guys, MD Primary Care at [facility] [place], [place] [address] Ph.  [phone] Fax [phone]

## 2018-09-15 DIAGNOSIS — M25512 Pain in left shoulder: Secondary | ICD-10-CM | POA: Diagnosis not present

## 2018-09-15 DIAGNOSIS — M19012 Primary osteoarthritis, left shoulder: Secondary | ICD-10-CM | POA: Diagnosis not present

## 2018-09-22 ENCOUNTER — Other Ambulatory Visit: Payer: Self-pay | Admitting: Orthopedic Surgery

## 2018-09-22 DIAGNOSIS — M19012 Primary osteoarthritis, left shoulder: Secondary | ICD-10-CM

## 2018-09-23 ENCOUNTER — Ambulatory Visit (INDEPENDENT_AMBULATORY_CARE_PROVIDER_SITE_OTHER): Payer: Medicare HMO | Admitting: Family Medicine

## 2018-09-23 ENCOUNTER — Other Ambulatory Visit: Payer: Self-pay

## 2018-09-23 VITALS — BP 128/82 | Ht 61.0 in | Wt 149.0 lb

## 2018-09-23 DIAGNOSIS — Z0000 Encounter for general adult medical examination without abnormal findings: Secondary | ICD-10-CM | POA: Diagnosis not present

## 2018-09-23 NOTE — Progress Notes (Signed)
Presents today for [employer] Visit  I connected with  Lindsey Bray on 09/23/18 by a Telephone visit and verified that I am speaking with the correct person using two identifiers.   The patient expressed understanding and agreed to proceed.    Date of last exam: 09/13/2018  Interpreter used for this visit?no  Patient Care Team: Rutherford Guys, MD as PCP - General (Family Medicine) Nicholes Stairs, MD as Consulting Physician (Orthopedic Surgery) Vania Rea, MD as Consulting Physician (Obstetrics and Gynecology)   Other items to address today:   Discussed eye/dental yearly Discussed immunizations  Discussed colonoscopy referral placed  Tetanus declined due to insurance    ADVANCE DIRECTIVES: Discussed: yes On File:no (copy requested) Materials Provided:no  Immunization status:  Immunization History  Administered Date(s) Administered  . Influenza,inj,Quad PF,6+ Mos 04/06/2016     Health Maintenance Due  Topic Date Due  . Hepatitis C Screening  August 22, 1950  . TETANUS/TDAP  09/15/1970  . COLONOSCOPY  09/14/2001  . DEXA SCAN  09/14/2016     Functional Status Survey: Is the patient deaf or have difficulty hearing?: No Does the patient have difficulty seeing, even when wearing glasses/contacts?: No Does the patient have difficulty concentrating, remembering, or making decisions?: No Does the patient have difficulty walking or climbing stairs?: No Does the patient have difficulty dressing or bathing?: No Does the patient have difficulty doing errands alone such as visiting a doctor's office or shopping?: No   6CIT Screen 09/23/2018  What Year? 0 points  What month? 0 points  What time? 0 points  Count back from 20 0 points  Months in reverse 0 points  Repeat phrase 0 points  Total Score 0        Clinical Support from 09/23/2018 in [facility] at [facility]  AUDIT-C Score  0       Home Environment:   Lives two story home   Some scattered rugs  No grab bars  No trouble climbing stairs   Patient Active Problem List   Diagnosis Date Noted  . Mild intermittent asthma without complication 81/44/8185  . Family history of colon cancer in mother 09/13/2018  . Postmenopausal estrogen deficiency 09/13/2018  . Pain of left shoulder joint on movement 05/07/2018  . Trigger finger of right hand 05/22/2017  . Family history of breast cancer in sister 03/09/2015     Past Medical History:  Diagnosis Date  . Allergy    as a child and young adult  . Asthma    as a child and young adult     Past Surgical History:  Procedure Laterality Date  . BREAST SURGERY Right 61 (age 18)   benign breast tumors  . trigger thumb  05/2017   r hand     Family History  Problem Relation Age of Onset  . Cancer Mother 7       colon  . COPD Father   . Heart disease Father   . Cancer Sister 73       breast  . Cancer Sister 17       breast; had a lumpectomy and is on tamoxifen     Social History   Socioeconomic History  . Marital status: Single    Spouse name: Not on file  . Number of children: 1  . Years of education: Not on file  . Highest education level: Not on file  Occupational History  . Not on file  Social Needs  .  Financial resource strain: Not on file  . Food insecurity:    Worry: Not on file    Inability: Not on file  . Transportation needs:    Medical: Not on file    Non-medical: Not on file  Tobacco Use  . Smoking status: Never Smoker  . Smokeless tobacco: Never Used  Substance and Sexual Activity  . Alcohol use: No    Alcohol/week: 0.0 standard drinks  . Drug use: Not on file  . Sexual activity: Not on file  Lifestyle  . Physical activity:    Days per week: Not on file    Minutes per session: Not on file  . Stress: Not on file  Relationships  . Social connections:    Talks on phone: Not on file    Gets together: Not on file    Attends religious service: Not on file    Active  member of club or organization: Not on file    Attends meetings of clubs or organizations: Not on file    Relationship status: Not on file  . Intimate partner violence:    Fear of current or ex partner: Not on file    Emotionally abused: Not on file    Physically abused: Not on file    Forced sexual activity: Not on file  Other Topics Concern  . Not on file  Social History Narrative   Lives by herself in [place]; has a 27 yo son, who is married. No grandchildren.     No Known Allergies   Prior to Admission medications   Medication Sig Start Date End Date Taking? Authorizing Provider  albuterol (VENTOLIN HFA) 108 (90 Base) MCG/ACT inhaler Inhale 2 puffs into the lungs every 4 (four) hours as needed for wheezing or shortness of breath (cough, shortness of breath or wheezing.). 09/13/18  Yes Rutherford Guys, MD  diclofenac sodium (VOLTAREN) 1 % GEL APP 4 GRAMS AA EXT QID 05/07/18   [provider]  Ibuprofen-Famotidine (DUEXIS) 800-26.6 MG TABS Duexis 800 mg-26.6 mg tablet  Take 1 tablet 3 times a day by oral route.    [provider]     Depression screen [facility] 2/9 09/23/2018 09/13/2018 03/09/2015  Decreased Interest 0 0 0  Down, Depressed, Hopeless 0 0 0  PHQ - 2 Score 0 0 0     Fall Risk  09/23/2018 09/13/2018  Falls in the past year? 0 0  Number falls in past yr: 0 0  Injury with Fall? 0 0      PHYSICAL EXAM: BP 128/82 Comment: per patient  Ht 5\' 1"  (1.549 m)   Wt 149 lb (67.6 kg)   BMI 28.15 kg/m    Wt Readings from Last 3 Encounters:  09/23/18 149 lb (67.6 kg)  10/27/15 149 lb (67.6 kg)  03/09/15 152 lb 12.8 oz (69.3 kg)     No exam data present    Physical Exam   Education/Counseling provided regarding diet and exercise, prevention of chronic diseases, smoking/tobacco cessation, if applicable, and reviewed "Covered Medicare Preventive Services."   ASSESSMENT/PLAN: 1. Medicare annual wellness visit, subsequent

## 2018-09-23 NOTE — Patient Instructions (Signed)
Advance Directive  Advance directives are legal documents that let you make choices ahead of time about your health care and medical treatment in case you become unable to communicate for yourself. Advance directives are a way for you to communicate your wishes to family, friends, and health care providers. This can help convey your decisions about end-of-life care if you become unable to communicate. Discussing and writing advance directives should happen over time rather than all at once. Advance directives can be changed depending on your situation and what you want, even after you have signed the advance directives. If you do not have an advance directive, some states assign family decision makers to act on your behalf based on how closely you are related to them. Each state has its own laws regarding advance directives. You may want to check with your health care provider, attorney, or state representative about the laws in your state. There are different types of advance directives, such as:  Medical power of attorney.  Living will.  Do not resuscitate (DNR) or do not attempt resuscitation (DNAR) order. Health care proxy and medical power of attorney A health care proxy, also called a health care agent, is a person who is appointed to make medical decisions for you in cases in which you are unable to make the decisions yourself. Generally, people choose someone they know well and trust to represent their preferences. Make sure to ask this person for an agreement to act as your proxy. A proxy may have to exercise judgment in the event of a medical decision for which your wishes are not known. A medical power of attorney is a legal document that names your health care proxy. Depending on the laws in your state, after the document is written, it may also need to be:  Signed.  Notarized.  Dated.  Copied.  Witnessed.  Incorporated into your medical record. You may also want to appoint  someone to manage your financial affairs in a situation in which you are unable to do so. This is called a durable power of attorney for finances. It is a separate legal document from the durable power of attorney for health care. You may choose the same person or someone different from your health care proxy to act as your agent in financial matters. If you do not appoint a proxy, or if there is a concern that the proxy is not acting in your best interests, a court-appointed guardian may be designated to act on your behalf. Living will A living will is a set of instructions documenting your wishes about medical care when you cannot express them yourself. Health care providers should keep a copy of your living will in your medical record. You may want to give a copy to family members or friends. To alert caregivers in case of an emergency, you can place a card in your wallet to let them know that you have a living will and where they can find it. A living will is used if you become:  Terminally ill.  Incapacitated.  Unable to communicate or make decisions. Items to consider in your living will include:  The use or non-use of life-sustaining equipment, such as dialysis machines and breathing machines (ventilators).  A DNR or DNAR order, which is the instruction not to use cardiopulmonary resuscitation (CPR) if breathing or heartbeat stops.  The use or non-use of tube feeding.  Withholding of food and fluids.  Comfort (palliative) care when the goal becomes comfort rather  than a cure.  Organ and tissue donation. A living will does not give instructions for distributing your money and property if you should pass away. It is recommended that you seek the advice of a lawyer when writing a will. Decisions about taxes, beneficiaries, and asset distribution will be legally binding. This process can relieve your family and friends of any concerns surrounding disputes or questions that may come up about  the distribution of your assets. DNR or DNAR A DNR or DNAR order is a request not to have CPR in the event that your heart stops beating or you stop breathing. If a DNR or DNAR order has not been made and shared, a health care provider will try to help any patient whose heart has stopped or who has stopped breathing. If you plan to have surgery, talk with your health care provider about how your DNR or DNAR order will be followed if problems occur. Summary  Advance directives are the legal documents that allow you to make choices ahead of time about your health care and medical treatment in case you become unable to communicate for yourself.  The process of discussing and writing advance directives should happen over time. You can change the advance directives, even after you have signed them.  Advance directives include DNR or DNAR orders, living wills, and designating an agent as your medical power of attorney. This information is not intended to replace advice given to you by your health care provider. Make sure you discuss any questions you have with your health care provider. Document Released: 07/29/2007 Document Revised: 03/10/2016 Document Reviewed: 03/10/2016 Elsevier Interactive Patient Education  2019 [place] Following a healthy eating pattern may help you to achieve and maintain a healthy body weight, reduce the risk of chronic disease, and live a long and productive life. It is important to follow a healthy eating pattern at an appropriate calorie level for your body. Your nutritional needs should be met primarily through food by choosing a variety of nutrient-rich foods. What are tips for following this plan? Reading food labels  Read labels and choose the following: ? Reduced or low sodium. ? Juices with 100% fruit juice. ? Foods with low saturated fats and high polyunsaturated and monounsaturated fats. ? Foods with whole grains, such as whole wheat, cracked  wheat, brown rice, and wild rice. ? Whole grains that are fortified with folic acid. This is recommended for women who are pregnant or who want to become pregnant.  Read labels and avoid the following: ? Foods with a lot of added sugars. These include foods that contain brown sugar, corn sweetener, corn syrup, dextrose, fructose, glucose, high-fructose corn syrup, honey, invert sugar, lactose, malt syrup, maltose, molasses, raw sugar, sucrose, trehalose, or turbinado sugar.  Do not eat more than the following amounts of added sugar per day:  6 teaspoons (25 g) for women.  9 teaspoons (38 g) for men. ? Foods that contain processed or refined starches and grains. ? Refined grain products, such as white flour, degermed cornmeal, white bread, and white rice. Shopping  Choose nutrient-rich snacks, such as vegetables, whole fruits, and nuts. Avoid high-calorie and high-sugar snacks, such as potato chips, fruit snacks, and candy.  Use oil-based dressings and spreads on foods instead of solid fats such as butter, stick margarine, or cream cheese.  Limit pre-made sauces, mixes, and "instant" products such as flavored rice, instant noodles, and ready-made pasta.  Try more plant-protein sources, such as tofu, tempeh,  black beans, edamame, lentils, nuts, and seeds.  Explore eating plans such as the Mediterranean diet or vegetarian diet. Cooking  Use oil to saut or stir-fry foods instead of solid fats such as butter, stick margarine, or lard.  Try baking, boiling, grilling, or broiling instead of frying.  Remove the fatty part of meats before cooking.  Steam vegetables in water or broth. Meal planning   At meals, imagine dividing your plate into fourths: ? One-half of your plate is fruits and vegetables. ? One-fourth of your plate is whole grains. ? One-fourth of your plate is protein, especially lean meats, poultry, eggs, tofu, beans, or nuts.  Include low-fat dairy as part of your  daily diet. Lifestyle  Choose healthy options in all settings, including home, work, school, restaurants, or stores.  Prepare your food safely: ? Wash your hands after handling raw meats. ? Keep food preparation surfaces clean by regularly washing with hot, soapy water. ? Keep raw meats separate from ready-to-eat foods, such as fruits and vegetables. ? Cook seafood, meat, poultry, and eggs to the recommended internal temperature. ? Store foods at safe temperatures. In general:  Keep cold foods at 55F (4.4C) or below.  Keep hot foods at 155F (60C) or above.  Keep your freezer at [facility] (-17.8C) or below.  Foods are no longer safe to eat when they have been between the temperatures of 40-155F (4.4-60C) for more than 2 hours. What foods should I eat? Fruits Aim to eat 2 cup-equivalents of fresh, canned (in natural juice), or frozen fruits each day. Examples of 1 cup-equivalent of fruit include 1 small apple, 8 large strawberries, 1 cup canned fruit,  cup dried fruit, or 1 cup 100% juice. Vegetables Aim to eat 2-3 cup-equivalents of fresh and frozen vegetables each day, including different varieties and colors. Examples of 1 cup-equivalent of vegetables include 2 medium carrots, 2 cups raw, leafy greens, 1 cup chopped vegetable (raw or cooked), or 1 medium baked potato. Grains Aim to eat 6 ounce-equivalents of whole grains each day. Examples of 1 ounce-equivalent of grains include 1 slice of bread, 1 cup ready-to-eat cereal, 3 cups popcorn, or  cup cooked rice, pasta, or cereal. Meats and other proteins Aim to eat 5-6 ounce-equivalents of protein each day. Examples of 1 ounce-equivalent of protein include 1 egg, 1/2 cup nuts or seeds, or 1 tablespoon (16 g) peanut butter. A cut of meat or fish that is the size of a deck of cards is about 3-4 ounce-equivalents.  Of the protein you eat each week, try to have at least 8 ounces come from seafood. This includes salmon, trout, herring, and  anchovies. Dairy Aim to eat 3 cup-equivalents of fat-free or low-fat dairy each day. Examples of 1 cup-equivalent of dairy include 1 cup (240 mL) milk, 8 ounces (250 g) yogurt, 1 ounces (44 g) natural cheese, or 1 cup (240 mL) fortified soy milk. Fats and oils  Aim for about 5 teaspoons (21 g) per day. Choose monounsaturated fats, such as canola and olive oils, avocados, peanut butter, and most nuts, or polyunsaturated fats, such as sunflower, corn, and soybean oils, walnuts, pine nuts, sesame seeds, sunflower seeds, and flaxseed. Beverages  Aim for six 8-oz glasses of water per day. Limit coffee to three to five 8-oz cups per day.  Limit caffeinated beverages that have added calories, such as soda and energy drinks.  Limit alcohol intake to no more than 1 drink a day for nonpregnant women and 2 drinks a day  for men. One drink equals 12 oz of beer (355 mL), 5 oz of wine (148 mL), or 1 oz of hard liquor (44 mL). Seasoning and other foods  Avoid adding excess amounts of salt to your foods. Try flavoring foods with herbs and spices instead of salt.  Avoid adding sugar to foods.  Try using oil-based dressings, sauces, and spreads instead of solid fats. This information is based on general [place] nutrition guidelines. For more information, visit choosemyplate.gov. Exact amounts may vary based on your nutrition needs. Summary  A healthy eating plan may help you to maintain a healthy weight, reduce the risk of chronic diseases, and stay active throughout your life.  Plan your meals. Make sure you eat the right portions of a variety of nutrient-rich foods.  Try baking, boiling, grilling, or broiling instead of frying.  Choose healthy options in all settings, including home, work, school, restaurants, or stores. This information is not intended to replace advice given to you by your health care provider. Make sure you discuss any questions you have with your health care provider. Document  Released: 08/03/2017 Document Revised: 08/03/2017 Document Reviewed: 08/03/2017 Elsevier Interactive Patient Education  2019 [employer]vier Inc[employer]  

## 2018-10-22 ENCOUNTER — Ambulatory Visit
Admission: RE | Admit: 2018-10-22 | Discharge: 2018-10-22 | Disposition: A | Discharge status: Home / Self Care | Payer: 59 | Source: Ambulatory Visit | Attending: Orthopedic Surgery | Admitting: Orthopedic Surgery

## 2018-10-22 ENCOUNTER — Other Ambulatory Visit: Payer: Self-pay

## 2018-10-22 DIAGNOSIS — M19012 Primary osteoarthritis, left shoulder: Secondary | ICD-10-CM

## 2018-10-29 ENCOUNTER — Telehealth: Payer: Self-pay

## 2018-10-29 ENCOUNTER — Telehealth: Payer: Self-pay | Admitting: Family Medicine

## 2018-10-29 NOTE — Telephone Encounter (Signed)
Surgical Clearance form received from [facility].  Call to pt.  Unable to complete form as pt has not been seen in office by provider since 2016.  Will need OV for clearance.

## 2018-11-08 DIAGNOSIS — Z7189 Other specified counseling: Secondary | ICD-10-CM | POA: Diagnosis not present

## 2018-11-08 DIAGNOSIS — Z03818 Encounter for observation for suspected exposure to other biological agents ruled out: Secondary | ICD-10-CM | POA: Diagnosis not present

## 2018-11-19 ENCOUNTER — Ambulatory Visit
Admission: RE | Admit: 2018-11-19 | Discharge: 2018-11-19 | Disposition: A | Discharge status: Home / Self Care | Payer: 59 | Source: Ambulatory Visit | Attending: Family Medicine | Admitting: Family Medicine

## 2018-11-19 DIAGNOSIS — Z780 Asymptomatic menopausal state: Secondary | ICD-10-CM

## 2018-11-29 ENCOUNTER — Ambulatory Visit (INDEPENDENT_AMBULATORY_CARE_PROVIDER_SITE_OTHER): Payer: Medicare HMO | Admitting: Registered Nurse

## 2018-11-29 ENCOUNTER — Encounter: Payer: Self-pay | Admitting: Registered Nurse

## 2018-11-29 ENCOUNTER — Other Ambulatory Visit: Payer: Self-pay

## 2018-11-29 VITALS — BP 130/70 | HR 72 | Temp 98.5°F | Resp 18 | Ht 59.25 in | Wt 158.0 lb

## 2018-11-29 DIAGNOSIS — Z13220 Encounter for screening for lipoid disorders: Secondary | ICD-10-CM

## 2018-11-29 DIAGNOSIS — Z23 Encounter for immunization: Secondary | ICD-10-CM | POA: Diagnosis not present

## 2018-11-29 DIAGNOSIS — Z0001 Encounter for general adult medical examination with abnormal findings: Secondary | ICD-10-CM

## 2018-11-29 DIAGNOSIS — Z01818 Encounter for other preprocedural examination: Secondary | ICD-10-CM

## 2018-11-29 DIAGNOSIS — Z1211 Encounter for screening for malignant neoplasm of colon: Secondary | ICD-10-CM | POA: Diagnosis not present

## 2018-11-29 DIAGNOSIS — Z1329 Encounter for screening for other suspected endocrine disorder: Secondary | ICD-10-CM | POA: Diagnosis not present

## 2018-11-29 DIAGNOSIS — Z13228 Encounter for screening for other metabolic disorders: Secondary | ICD-10-CM

## 2018-11-29 DIAGNOSIS — Z0000 Encounter for general adult medical examination without abnormal findings: Secondary | ICD-10-CM

## 2018-11-29 DIAGNOSIS — Z130 Encounter for screening for diseases of the blood and blood-forming organs and certain disorders involving the immune mechanism: Secondary | ICD-10-CM

## 2018-11-29 NOTE — Patient Instructions (Signed)
° ° ° °  If you have lab work done today you will be contacted with your lab results within the next 2 weeks.  If you have not heard from us then please contact us. The fastest way to get your results is to register for My Chart. ° ° °IF you received an x-ray today, you will receive an invoice from [facility]. Please contact  Radiology at [phone] with questions or concerns regarding your invoice.  ° °IF you received labwork today, you will receive an invoice from [facility]. Please contact [facility] at [phone] with questions or concerns regarding your invoice.  ° °Our billing staff will not be able to assist you with questions regarding bills from these companies. ° °You will be contacted with the lab results as soon as they are available. The fastest way to get your results is to activate your My Chart account. Instructions are located on the last page of this paperwork. If you have not heard from us regarding the results in 2 weeks, please contact this office. °  ° ° ° °

## 2018-11-29 NOTE — Progress Notes (Signed)
Established Patient Office Visit  Subjective:  Patient ID: Lindsey Bray, female    DOB: 1951/02/29  Age: 68 y.o. MRN: [id number]  CC:  Chief Complaint  Patient presents with  . Annual Exam    HPI Lindsey Bray presents for CPE. States that she needs CPE prior to L side total shoulder replacement surgery to happen at [facility] within the next 4-6 mos.  No urgent complaints.  History of asthma. Well controlled with albuterol inhaler, uses infrequently.  Otherwise, some shoulder pain r/t osteoarthritis, managed by [facility] and occasional ibuprofen.  Had AWV with Dr. Pamella Pert earlier this year - 09/23/18  Past Medical History:  Diagnosis Date  . Allergy    as a child and young adult  . Asthma    as a child and young adult    Past Surgical History:  Procedure Laterality Date  . BREAST SURGERY Right 61 (age 81)   benign breast tumors  . trigger thumb  05/2017   r hand    Family History  Problem Relation Age of Onset  . Cancer Mother 77       colon  . COPD Father   . Heart disease Father   . Cancer Sister 81       breast  . Cancer Sister 18       breast; had a lumpectomy and is on tamoxifen    Social History   Socioeconomic History  . Marital status: Single    Spouse name: Not on file  . Number of children: 1  . Years of education: Not on file  . Highest education level: Not on file  Occupational History  . Not on file  Social Needs  . Financial resource strain: Not on file  . Food insecurity    Worry: Not on file    Inability: Not on file  . Transportation needs    Medical: Not on file    Non-medical: Not on file  Tobacco Use  . Smoking status: Never Smoker  . Smokeless tobacco: Never Used  Substance and Sexual Activity  . Alcohol use: No    Alcohol/week: 0.0 standard drinks  . Drug use: Not on file  . Sexual activity: Not on file  Lifestyle  . Physical activity    Days per week: Not on file    Minutes per session: Not on file  .  Stress: Not on file  Relationships  . Social Herbalist on phone: Not on file    Gets together: Not on file    Attends religious service: Not on file    Active member of club or organization: Not on file    Attends meetings of clubs or organizations: Not on file    Relationship status: Not on file  . Intimate partner violence    Fear of current or ex partner: Not on file    Emotionally abused: Not on file    Physically abused: Not on file    Forced sexual activity: Not on file  Other Topics Concern  . Not on file  Social History Narrative   Lives by herself in [place]; has a 3 yo son, who is married. No grandchildren.    Outpatient Medications Prior to Visit  Medication Sig Dispense Refill  . albuterol (VENTOLIN HFA) 108 (90 Base) MCG/ACT inhaler Inhale 2 puffs into the lungs every 4 (four) hours as needed for wheezing or shortness of breath (cough, shortness of breath or wheezing.). 1 Inhaler  1  . diclofenac sodium (VOLTAREN) 1 % GEL APP 4 GRAMS AA EXT QID    . Ibuprofen-Famotidine (DUEXIS) 800-26.6 MG TABS Duexis 800 mg-26.6 mg tablet  Take 1 tablet 3 times a day by oral route.     No facility-administered medications prior to visit.     No Known Allergies  ROS Review of Systems  Constitutional: Negative.   HENT: Negative.   Eyes: Negative.   Respiratory: Negative.   Cardiovascular: Negative.   Gastrointestinal: Negative.   Endocrine: Negative.   Genitourinary: Negative.   Musculoskeletal: Positive for arthralgias (OA in L shoulder).  Skin: Negative.   Allergic/Immunologic: Negative.   Neurological: Negative.   Hematological: Negative.   Psychiatric/Behavioral: Negative.   All other systems reviewed and are negative.     Objective:    Physical Exam  Constitutional: She is oriented to person, place, and time. She appears well-developed and well-nourished.  HENT:  Head: Normocephalic and atraumatic.  Right Ear: External ear normal.  Left Ear:  External ear normal.  Nose: Nose normal.  Mouth/Throat: Oropharynx is clear and moist. No oropharyngeal exudate.  Eyes: Pupils are equal, round, and reactive to light. Conjunctivae and EOM are normal. Right eye exhibits no discharge. Left eye exhibits no discharge. No scleral icterus.  Neck: Normal range of motion. Neck supple. No tracheal deviation present. No thyromegaly present.  Cardiovascular: Normal rate, regular rhythm, normal heart sounds and intact distal pulses. Exam reveals no gallop and no friction rub.  No murmur heard. Pulmonary/Chest: Effort normal and breath sounds normal. No respiratory distress. She has no wheezes. She has no rales. She exhibits no tenderness.  Abdominal: Soft. Bowel sounds are normal. She exhibits no distension and no mass. There is no abdominal tenderness. There is no rebound and no guarding.  Musculoskeletal: Normal range of motion.        General: No tenderness, deformity or edema.  Lymphadenopathy:    She has no cervical adenopathy.  Neurological: She is alert and oriented to person, place, and time. No cranial nerve deficit.  Skin: Skin is warm and dry. No rash noted. No erythema. No pallor.  Psychiatric: She has a normal mood and affect. Her behavior is normal. Judgment and thought content normal.  Nursing note and vitals reviewed.   BP 130/70   Pulse 72   Temp 98.5 F (36.9 C) (Oral)   Resp 18   Ht 4' 11.25" (1.505 m)   Wt 158 lb (71.7 kg)   SpO2 98%   BMI 31.64 kg/m  Wt Readings from Last 3 Encounters:  11/29/18 158 lb (71.7 kg)  09/23/18 149 lb (67.6 kg)  10/27/15 149 lb (67.6 kg)     Health Maintenance Due  Topic Date Due  . Hepatitis C Screening  1950/08/28  . TETANUS/TDAP  09/15/1970  . COLONOSCOPY  09/14/2001  . PNA vac Low Risk Adult (1 of 2 - PCV13) 09/14/2016    There are no preventive care reminders to display for this patient.  No results found for: TSH Lab Results  Component Value Date   WBC 7.1 02/14/2007   HGB  15.3 (H) 02/17/2007   HCT 45.0 02/17/2007   MCV 83.6 02/14/2007   PLT 266 02/14/2007   Lab Results  Component Value Date   NA 139 02/17/2007   K 3.9 02/17/2007   GLUCOSE 97 02/17/2007   BUN 9 02/17/2007   CREATININE 0.9 02/14/2007   No results found for: CHOL No results found for: HDL No results found for:  [place] No results found for: TRIG No results found for: CHOLHDL No results found for: HGBA1C    Assessment & Plan:   Problem List Items Addressed This Visit    None    Visit Diagnoses    Screen for colon cancer    -  Primary   Relevant Orders   Ambulatory referral to Gastroenterology   Need for prophylactic vaccination against Streptococcus pneumoniae (pneumococcus)       Relevant Orders   Pneumococcal conjugate vaccine 13-valent IM   Screening for endocrine, metabolic and immunity disorder       Relevant Orders   CBC with Differential/Platelet   Comprehensive metabolic panel   TSH   Hemoglobin A1c   Lipid screening       Relevant Orders   Lipid panel      No orders of the defined types were placed in this encounter.   Follow-up: No follow-ups on file.   PLAN  No pre-op paperwork at this time - will place future orders so that patient may have "lab only" visit for EKG, urinalysis, PT-INR as [facility] requires for pre-op visits.  Normal findings on exam  Labs ordered, will follow as warranted  Prevnar13 first dose given today  TDap delayed - medicare will likely not reimburse.  Ears lavaged today d/t cerumen impaction - TMs normal afterwards.  Patient encouraged to call clinic with any questions, comments, or concerns.   Maximiano Coss, NP

## 2018-12-01 ENCOUNTER — Encounter: Payer: Self-pay | Admitting: Registered Nurse

## 2018-12-01 LAB — HEMOGLOBIN A1C
Est. average glucose Bld gHb Est-mCnc: 134 mg/dL
Hgb A1c MFr Bld: 6.3 % — ABNORMAL HIGH (ref 4.8–5.6)

## 2018-12-01 LAB — COMPREHENSIVE METABOLIC PANEL
ALT: 19 IU/L (ref 0–32)
AST: 26 IU/L (ref 0–40)
Albumin/Globulin Ratio: 1.5 (ref 1.2–2.2)
Albumin: 4.6 g/dL (ref 3.8–4.8)
Alkaline Phosphatase: 75 IU/L (ref 39–117)
BUN/Creatinine Ratio: 12 (ref 12–28)
BUN: 11 mg/dL (ref 8–27)
Bilirubin Total: <0.2 mg/dL (ref 0.0–1.2)
CO2: 16 mmol/L — ABNORMAL LOW (ref 20–29)
Calcium: 9.8 mg/dL (ref 8.7–10.3)
Chloride: 112 mmol/L — ABNORMAL HIGH (ref 96–106)
Creatinine, Ser: 0.89 mg/dL (ref 0.57–1.00)
GFR calc Af Amer: 78 mL/min/{1.73_m2} (ref >59)
GFR calc non Af Amer: 67 mL/min/{1.73_m2} (ref >59)
Globulin, Total: 3.1 g/dL (ref 1.5–4.5)
Glucose: 117 mg/dL — ABNORMAL HIGH (ref 65–99)
Potassium: 4.9 mmol/L (ref 3.5–5.2)
Sodium: 153 mmol/L — ABNORMAL HIGH (ref 134–144)
Total Protein: 7.7 g/dL (ref 6.0–8.5)

## 2018-12-01 LAB — CBC WITH DIFFERENTIAL/PLATELET
Basophils Absolute: 0 10*3/uL (ref 0.0–0.2)
Basos: 1 %
EOS (ABSOLUTE): 0.1 10*3/uL (ref 0.0–0.4)
Eos: 4 %
Hematocrit: 37.9 % (ref 34.0–46.6)
Hemoglobin: 12.4 g/dL (ref 11.1–15.9)
Immature Grans (Abs): 0 10*3/uL (ref 0.0–0.1)
Immature Granulocytes: 0 %
Lymphocytes Absolute: 1.3 10*3/uL (ref 0.7–3.1)
Lymphs: 47 %
MCH: 26.2 pg — ABNORMAL LOW (ref 26.6–33.0)
MCHC: 32.7 g/dL (ref 31.5–35.7)
MCV: 80 fL (ref 79–97)
Monocytes Absolute: 0.4 10*3/uL (ref 0.1–0.9)
Monocytes: 14 %
Neutrophils Absolute: 0.9 10*3/uL — ABNORMAL LOW (ref 1.4–7.0)
Neutrophils: 34 %
Platelets: 207 10*3/uL (ref 150–450)
RBC: 4.73 x10E6/uL (ref 3.77–5.28)
RDW: 14.6 % (ref 11.7–15.4)
WBC: 2.7 10*3/uL — ABNORMAL LOW (ref 3.4–10.8)

## 2018-12-01 LAB — LIPID PANEL
Chol/HDL Ratio: 3.4 ratio (ref 0.0–4.4)
Cholesterol, Total: 193 mg/dL (ref 100–199)
HDL: 57 mg/dL (ref >39)
LDL Calculated: 116 mg/dL — ABNORMAL HIGH (ref 0–99)
Triglycerides: 101 mg/dL (ref 0–149)
VLDL Cholesterol Cal: 20 mg/dL (ref 5–40)

## 2018-12-01 LAB — TSH: TSH: 2.51 u[IU]/mL (ref 0.450–4.500)

## 2018-12-01 NOTE — Progress Notes (Signed)
Called pt to discuss results, no answer, VM not set up. Sending letter detailing results including low WBC and elevated A1c.  Kathrin Ruddy, NP

## 2018-12-02 ENCOUNTER — Encounter: Payer: Self-pay | Admitting: Radiology

## 2018-12-09 ENCOUNTER — Other Ambulatory Visit: Payer: Self-pay

## 2018-12-09 ENCOUNTER — Ambulatory Visit (AMBULATORY_SURGERY_CENTER): Payer: Self-pay | Admitting: *Deleted

## 2018-12-09 VITALS — Temp 97.4°F | Ht 59.5 in | Wt 156.0 lb

## 2018-12-09 DIAGNOSIS — Z1211 Encounter for screening for malignant neoplasm of colon: Secondary | ICD-10-CM

## 2018-12-09 MED ORDER — SUPREP BOWEL PREP KIT 17.5-3.13-1.6 GM/177ML PO SOLN: 1.0000 | Freq: Once | ORAL | 0 refills | Status: AC

## 2018-12-09 NOTE — Progress Notes (Signed)
No egg or soy allergy known to patient  No issues with past sedation with any surgeries  or procedures, no intubation problems  No diet pills per patient No home 02 use per patient  No blood thinners per patient  Pt denies issues with constipation  No A fib or A flutter  EMMI video sent to pt's e mail   In person PV today with pt.

## 2018-12-21 ENCOUNTER — Telehealth: Payer: Self-pay

## 2018-12-21 NOTE — Telephone Encounter (Addendum)
Covid-19 screening questions   Do you now or have you had a fever in the last 14 days? NO  Do you have any respiratory symptoms of shortness of breath or cough now or in the last 14 days? NO  Do you have any family members or close contacts with diagnosed or suspected Covid-19 in the past 14 days? NO   Have you been tested for Covid-19 and found to be positive? NO, TESTED RESULTS WERE NEGATIVE.       Confirmed with patient.

## 2018-12-22 ENCOUNTER — Ambulatory Visit (AMBULATORY_SURGERY_CENTER): Payer: Medicare HMO | Admitting: Gastroenterology

## 2018-12-22 ENCOUNTER — Other Ambulatory Visit: Payer: Self-pay

## 2018-12-22 ENCOUNTER — Encounter: Payer: Self-pay | Admitting: Gastroenterology

## 2018-12-22 VITALS — BP 117/64 | HR 67 | Temp 98.9°F | Resp 23 | Ht 59.0 in | Wt 156.0 lb

## 2018-12-22 DIAGNOSIS — Z1211 Encounter for screening for malignant neoplasm of colon: Secondary | ICD-10-CM | POA: Diagnosis not present

## 2018-12-22 DIAGNOSIS — J45909 Unspecified asthma, uncomplicated: Secondary | ICD-10-CM | POA: Diagnosis not present

## 2018-12-22 DIAGNOSIS — D120 Benign neoplasm of cecum: Secondary | ICD-10-CM | POA: Diagnosis not present

## 2018-12-22 MED ORDER — SODIUM CHLORIDE 0.9 % IV SOLN: 500.0000 mL | Freq: Once | INTRAVENOUS | Status: DC

## 2018-12-22 NOTE — Progress Notes (Signed)
Report given to PACU, vss 

## 2018-12-22 NOTE — Progress Notes (Signed)
Pt's states no medical or surgical changes since previsit or office visit. Temp by June Bullock, vs per [facility].

## 2018-12-22 NOTE — Progress Notes (Signed)
Called to room to assist during endoscopic procedure.  Patient ID and intended procedure confirmed with present staff. Received instructions for my participation in the procedure from the performing physician.  

## 2018-12-22 NOTE — Op Note (Signed)
[facility] Patient Name: Lindsey Bray Procedure Date: 12/22/2018 12:12 PM MRN: [id number] Endoscopist: Mallie Mussel L. Loletha Carrow , MD Age: 68 Referring MD:  Date of Birth: 1951-05-05 Gender: Female Account #: [id number] Procedure:                Colonoscopy Indications:              Screening for colorectal malignant neoplasm                            (Patient reports normal colonoscopy > 15 years ago) Medicines:                Monitored Anesthesia Care Procedure:                Pre-Anesthesia Assessment:                           - Prior to the procedure, a History and Physical                            was performed, and patient medications and                            allergies were reviewed. The patient's tolerance of                            previous anesthesia was also reviewed. The risks                            and benefits of the procedure and the sedation                            options and risks were discussed with the patient.                            All questions were answered, and informed consent                            was obtained. Prior Anticoagulants: The patient has                            taken no previous anticoagulant or antiplatelet                            agents. ASA Grade Assessment: II - A patient with                            mild systemic disease. After reviewing the risks                            and benefits, the patient was deemed in                            satisfactory condition to undergo the procedure.  After obtaining informed consent, the colonoscope                            was passed under direct vision. Throughout the                            procedure, the patient's blood pressure, pulse, and                            oxygen saturations were monitored continuously. The                            Colonoscope was introduced through the anus and                            advanced to the  the cecum, identified by                            appendiceal orifice and ileocecal valve. The                            colonoscopy was performed without difficulty. The                            patient tolerated the procedure well. The quality                            of the bowel preparation was excellent. The                            ileocecal valve, appendiceal orifice, and rectum                            were photographed. The bowel preparation used was                            SUPREP. Scope In: 12:20:25 PM Scope Out: 12:32:47 PM Scope Withdrawal Time: 0 hours 9 minutes 21 seconds  Total Procedure Duration: 0 hours 12 minutes 22 seconds  Findings:                 The perianal and digital rectal examinations were                            normal.                           A diminutive polyp was found in the ileocecal                            valve. The polyp was sessile. The polyp was removed                            with a cold snare. Resection and retrieval were  complete.                           Retroflexion in the rectum was not performed due to                            anatomy.                           The exam was otherwise without abnormality. Complications:            No immediate complications. Estimated Blood Loss:     Estimated blood loss was minimal. Impression:               - One diminutive polyp at the ileocecal valve,                            removed with a cold snare. Resected and retrieved.                           - The examination was otherwise normal. Recommendation:           - Patient has a contact number available for                            emergencies. The signs and symptoms of potential                            delayed complications were discussed with the                            patient. Return to normal activities tomorrow.                            Written discharge instructions were provided  to the                            patient.                           - Resume previous diet.                           - Continue present medications.                           - Await pathology results.                           - Repeat colonoscopy is recommended for                            surveillance. The colonoscopy date will be                            determined after pathology results from today's  exam become available for review. Walda Hertzog L. Loletha Carrow, MD 12/22/2018 12:36:27 PM This report has been signed electronically.

## 2018-12-22 NOTE — Patient Instructions (Signed)
Discharge instructions given. Handout on polyps. Resume previous medications. YOU HAD AN ENDOSCOPIC PROCEDURE TODAY AT [facility]:   Refer to the procedure report that was given to you for any specific questions about what was found during the examination.  If the procedure report does not answer your questions, please call your gastroenterologist to clarify.  If you requested that your care partner not be given the details of your procedure findings, then the procedure report has been included in a sealed envelope for you to review at your convenience later.  YOU SHOULD EXPECT: Some feelings of bloating in the abdomen. Passage of more gas than usual.  Walking can help get rid of the air that was put into your GI tract during the procedure and reduce the bloating. If you had a lower endoscopy (such as a colonoscopy or flexible sigmoidoscopy) you may notice spotting of blood in your stool or on the toilet paper. If you underwent a bowel prep for your procedure, you may not have a normal bowel movement for a few days.  Please Note:  You might notice some irritation and congestion in your nose or some drainage.  This is from the oxygen used during your procedure.  There is no need for concern and it should clear up in a day or so.  SYMPTOMS TO REPORT IMMEDIATELY:   Following lower endoscopy (colonoscopy or flexible sigmoidoscopy):  Excessive amounts of blood in the stool  Significant tenderness or worsening of abdominal pains  Swelling of the abdomen that is new, acute  Fever of 100F or higher   Black, tarry-looking stools  For urgent or emergent issues, a gastroenterologist can be reached at any hour by calling [phone].   DIET:  We do recommend a small meal at first, but then you may proceed to your regular diet.  Drink plenty of fluids but you should avoid alcoholic beverages for 24 hours.  ACTIVITY:  You should plan to take it easy for the rest of today and you  should NOT DRIVE or use heavy machinery until tomorrow (because of the sedation medicines used during the test).    FOLLOW UP: Our staff will call the number listed on your records 48-72 hours following your procedure to check on you and address any questions or concerns that you may have regarding the information given to you following your procedure. If we do not reach you, we will leave a message.  We will attempt to reach you two times.  During this call, we will ask if you have developed any symptoms of COVID 19. If you develop any symptoms (ie: fever, flu-like symptoms, shortness of breath, cough etc.) before then, please call [phone].  If you test positive for Covid 19 in the 2 weeks post procedure, please call and report this information to [place].    If any biopsies were taken you will be contacted by phone or by letter within the next 1-3 weeks.  Please call us at [phone] if you have not heard about the biopsies in 3 weeks.    SIGNATURES/CONFIDENTIALITY: You and/or your care partner have signed paperwork which will be entered into your electronic medical record.  These signatures attest to the fact that that the information above on your After Visit Summary has been reviewed and is understood.  Full responsibility of the confidentiality of this discharge information lies with you and/or your care-partner.

## 2018-12-24 ENCOUNTER — Telehealth: Payer: Self-pay | Admitting: *Deleted

## 2018-12-24 ENCOUNTER — Telehealth: Payer: Self-pay

## 2018-12-24 NOTE — Telephone Encounter (Signed)
  Follow up Call-  Call back number 12/22/2018  Post procedure Call Back phone  # [phone]  Permission to leave phone message No  Some recent data might be hidden     Patient questions:  Do you have a fever, pain , or abdominal swelling? No. Pain Score  0 *  Have you tolerated food without any problems? Yes.    Have you been able to return to your normal activities? Yes.    Do you have any questions about your discharge instructions: Diet   No. Medications  No. Follow up visit  No.  Do you have questions or concerns about your Care? No.  Actions: * If pain score is 4 or above: No action needed, pain <4.  1. Have you developed a fever since your procedure? no  2.   Have you had an respiratory symptoms (SOB or cough) since your procedure? no  3.   Have you tested positive for COVID 19 since your procedure no  4.   Have you had any family members/close contacts diagnosed with the COVID 19 since your procedure?  no   If yes to any of these questions please route to Joylene John, RN and Alphonsa Gin, Therapist, sports.

## 2018-12-24 NOTE — Telephone Encounter (Signed)
  Follow up Call-  Call back number 12/22/2018  Post procedure Call Back phone  # [phone]  Permission to leave phone message No  Some recent data might be hidden    No answer, no voicemail

## 2018-12-28 ENCOUNTER — Encounter: Payer: Self-pay | Admitting: Gastroenterology

## 2019-06-29 DIAGNOSIS — H5203 Hypermetropia, bilateral: Secondary | ICD-10-CM | POA: Diagnosis not present

## 2019-07-18 ENCOUNTER — Other Ambulatory Visit: Payer: Self-pay

## 2019-07-18 ENCOUNTER — Encounter: Payer: Self-pay | Admitting: Registered Nurse

## 2019-07-18 ENCOUNTER — Ambulatory Visit (INDEPENDENT_AMBULATORY_CARE_PROVIDER_SITE_OTHER): Payer: Medicare HMO | Admitting: Registered Nurse

## 2019-07-18 VITALS — BP 150/76 | HR 75 | Temp 98.1°F | Ht 59.0 in | Wt 158.4 lb

## 2019-07-18 DIAGNOSIS — E119 Type 2 diabetes mellitus without complications: Secondary | ICD-10-CM

## 2019-07-18 DIAGNOSIS — R350 Frequency of micturition: Secondary | ICD-10-CM | POA: Diagnosis not present

## 2019-07-18 LAB — POCT GLYCOSYLATED HEMOGLOBIN (HGB A1C): Hemoglobin A1C: 6.7 % — AB (ref 4.0–5.6)

## 2019-07-18 LAB — POCT URINALYSIS DIP (MANUAL ENTRY)
Bilirubin, UA: NEGATIVE
Blood, UA: NEGATIVE
Glucose, UA: NEGATIVE mg/dL
Ketones, POC UA: NEGATIVE mg/dL
Leukocytes, UA: NEGATIVE
Nitrite, UA: NEGATIVE
Protein Ur, POC: NEGATIVE mg/dL
Spec Grav, UA: >=1.03 — AB (ref 1.010–1.025)
Urobilinogen, UA: 0.2 E.U./dL
pH, UA: 5.5 (ref 5.0–8.0)

## 2019-07-18 MED ORDER — METFORMIN HCL 500 MG PO TABS: 500.0000 mg | ORAL_TABLET | Freq: Two times a day (BID) | ORAL | 3 refills | Status: DC

## 2019-07-18 NOTE — Patient Instructions (Signed)
° ° ° °  If you have lab work done today you will be contacted with your lab results within the next 2 weeks.  If you have not heard from us then please contact us. The fastest way to get your results is to register for My Chart. ° ° °IF you received an x-ray today, you will receive an invoice from [facility]. Please contact [facility] at [phone] with questions or concerns regarding your invoice.  ° °IF you received labwork today, you will receive an invoice from [facility]. Please contact [facility] at [phone] with questions or concerns regarding your invoice.  ° °Our billing staff will not be able to assist you with questions regarding bills from these companies. ° °You will be contacted with the lab results as soon as they are available. The fastest way to get your results is to activate your My Chart account. Instructions are located on the last page of this paperwork. If you have not heard from us regarding the results in 2 weeks, please contact this office. °  ° ° ° °

## 2019-07-28 NOTE — Progress Notes (Signed)
Established Patient Office Visit  Subjective:  Patient ID: Lindsey Bray, female    DOB: 1950/07/09  Age: 69 y.o. MRN: [id number]  CC:  Chief Complaint  Patient presents with  . Urinary Frequency    patient states in the last two or three weeks she has been urinating alot and she does not know if it because of her age alo she has been really thristy and dehydrated lately with really bad dry mouth.    HPI Lindsey Bray presents for frequent urination and frequent thirst. Been ongoing for the past 2-3 weeks. She has been prediabetic in the past.  No recent changes to diet, exercise Denies visual changes, chest pain,shob, doe, hematuria, nvd, fevers, chills  Otherwise feeling well.   Past Medical History:  Diagnosis Date  . Allergy    as a child and young adult  . Arthritis    Oa left shoulder  . Asthma    as a child and young adult    Past Surgical History:  Procedure Laterality Date  . BREAST SURGERY Right 24 (age 24)   benign breast tumors  . CESAREAN SECTION     x1  . COLONOSCOPY  2004   ~16 yrs ago- was normal   . trigger thumb  05/2017   r hand    Family History  Problem Relation Age of Onset  . Cancer Mother 85       colon- died age 71   . Colon cancer Mother   . COPD Father   . Heart disease Father   . Cancer Sister 3       breast  . Breast cancer Sister   . Cancer Sister 8       breast; had a lumpectomy and is on tamoxifen  . Breast cancer Sister   . Colon polyps Sister   . Esophageal cancer Neg Hx   . Rectal cancer Neg Hx   . Stomach cancer Neg Hx     Social History   Socioeconomic History  . Marital status: Single    Spouse name: Not on file  . Number of children: 1  . Years of education: Not on file  . Highest education level: Not on file  Occupational History  . Not on file  Tobacco Use  . Smoking status: Never Smoker  . Smokeless tobacco: Never Used  Substance and Sexual Activity  . Alcohol use: No    Alcohol/week: 0.0  standard drinks  . Drug use: Never  . Sexual activity: Not on file  Other Topics Concern  . Not on file  Social History Narrative   Lives by herself in [place]; has a 37 yo son, who is married. No grandchildren.   Social Determinants of Health   Financial Resource Strain:   . Difficulty of Paying Living Expenses:   Food Insecurity:   . Worried About Charity fundraiser in the Last Year:   . Arboriculturist in the Last Year:   Transportation Needs:   . Film/video editor (Medical):   Marland Kitchen Lack of Transportation (Non-Medical):   Physical Activity:   . Days of Exercise per Week:   . Minutes of Exercise per Session:   Stress:   . Feeling of Stress :   Social Connections:   . Frequency of Communication with Friends and Family:   . Frequency of Social Gatherings with Friends and Family:   . Attends Religious Services:   . Active Member of Clubs or  Organizations:   . Attends Archivist Meetings:   Marland Kitchen Marital Status:   Intimate Partner Violence:   . Fear of Current or Ex-Partner:   . Emotionally Abused:   Marland Kitchen Physically Abused:   . Sexually Abused:     Outpatient Medications Prior to Visit  Medication Sig Dispense Refill  . albuterol (VENTOLIN HFA) 108 (90 Base) MCG/ACT inhaler Inhale 2 puffs into the lungs every 4 (four) hours as needed for wheezing or shortness of breath (cough, shortness of breath or wheezing.). 1 Inhaler 1  . diclofenac sodium (VOLTAREN) 1 % GEL APP 4 GRAMS AA EXT QID    . ibuprofen (ADVIL) 200 MG tablet Take 200 mg by mouth every 6 (six) hours as needed.     No facility-administered medications prior to visit.    No Known Allergies  ROS Review of Systems  Constitutional: Negative.   HENT: Negative.   Eyes: Negative.   Respiratory: Negative.   Cardiovascular: Negative.   Gastrointestinal: Negative.   Endocrine: Positive for polydipsia and polyuria.  Genitourinary: Negative.   Musculoskeletal: Negative.   Skin: Negative.     Allergic/Immunologic: Negative.   Neurological: Negative.   Hematological: Negative.   Psychiatric/Behavioral: Negative.   All other systems reviewed and are negative.     Objective:    Physical Exam  Constitutional: She is oriented to person, place, and time. She appears well-developed and well-nourished. No distress.  Cardiovascular: Regular rhythm.  Pulmonary/Chest: Effort normal. No respiratory distress.  Neurological: She is alert and oriented to person, place, and time.  Skin: Skin is warm and dry. No rash noted. She is not diaphoretic. No erythema. No pallor.  Psychiatric: She has a normal mood and affect. Her behavior is normal. Judgment and thought content normal.  Nursing note and vitals reviewed.   BP (!) 150/76   Pulse 75   Temp 98.1 F (36.7 C) (Temporal)   Ht 4\' 11"  (1.499 m)   Wt 158 lb 6.4 oz (71.8 kg)   SpO2 99%   BMI 31.99 kg/m  Wt Readings from Last 3 Encounters:  07/18/19 158 lb 6.4 oz (71.8 kg)  12/22/18 156 lb (70.8 kg)  12/09/18 156 lb (70.8 kg)     Health Maintenance Due  Topic Date Due  . Hepatitis C Screening  Never done  . URINE MICROALBUMIN  Never done  . TETANUS/TDAP  Never done  . INFLUENZA VACCINE  12/04/2018    There are no preventive care reminders to display for this patient.  Lab Results  Component Value Date   TSH 2.510 11/29/2018   Lab Results  Component Value Date   WBC 2.7 (L) 11/29/2018   HGB 12.4 11/29/2018   HCT 37.9 11/29/2018   MCV 80 11/29/2018   PLT 207 11/29/2018   Lab Results  Component Value Date   NA 153 (H) 11/29/2018   K 4.9 11/29/2018   CO2 16 (L) 11/29/2018   GLUCOSE 117 (H) 11/29/2018   BUN 11 11/29/2018   CREATININE 0.89 11/29/2018   BILITOT <0.2 11/29/2018   ALKPHOS 75 11/29/2018   AST 26 11/29/2018   ALT 19 11/29/2018   PROT 7.7 11/29/2018   ALBUMIN 4.6 11/29/2018   CALCIUM 9.8 11/29/2018   Lab Results  Component Value Date   CHOL 193 11/29/2018   Lab Results  Component Value  Date   HDL 57 11/29/2018   Lab Results  Component Value Date   LDLCALC 116 (H) 11/29/2018   Lab Results  Component Value  Date   TRIG 101 11/29/2018   Lab Results  Component Value Date   CHOLHDL 3.4 11/29/2018   Lab Results  Component Value Date   HGBA1C 6.7 (A) 07/18/2019      Assessment & Plan:   Problem List Items Addressed This Visit    None    Visit Diagnoses    Frequent urination    -  Primary   Relevant Orders   POCT urinalysis dipstick (Completed)   POCT glycosylated hemoglobin (Hb A1C) (Completed)   Newly diagnosed diabetes ([place])       Relevant Medications   metFORMIN (GLUCOPHAGE) 500 MG tablet      Meds ordered this encounter  Medications  . metFORMIN (GLUCOPHAGE) 500 MG tablet    Sig: Take 1 tablet (500 mg total) by mouth 2 (two) times daily with a meal.    Dispense:  180 tablet    Refill:  3    Order Specific Question:   Supervising Provider    Answer:   Forrest Moron [id number]    Follow-up: Return in about 3 months (around 10/18/2019) for t2dm follow up.   PLAN  A1c shows diagnostic of T2dm.  Start metformin 500mg  PO bid   Improve diet and lifestyle  Return in 3 mo for a1c check  Patient encouraged to call clinic with any questions, comments, or concerns.  Maximiano Coss, NP

## 2019-08-15 DIAGNOSIS — Z1231 Encounter for screening mammogram for malignant neoplasm of breast: Secondary | ICD-10-CM | POA: Diagnosis not present

## 2019-10-17 ENCOUNTER — Encounter: Payer: Self-pay | Admitting: Registered Nurse

## 2019-10-17 ENCOUNTER — Other Ambulatory Visit: Payer: Self-pay

## 2019-10-17 ENCOUNTER — Ambulatory Visit (INDEPENDENT_AMBULATORY_CARE_PROVIDER_SITE_OTHER): Payer: Medicare HMO | Admitting: Registered Nurse

## 2019-10-17 VITALS — BP 135/70 | HR 78 | Temp 98.7°F | Resp 16 | Ht 59.0 in | Wt 154.0 lb

## 2019-10-17 DIAGNOSIS — E119 Type 2 diabetes mellitus without complications: Secondary | ICD-10-CM | POA: Insufficient documentation

## 2019-10-17 LAB — POCT GLYCOSYLATED HEMOGLOBIN (HGB A1C): Hemoglobin A1C: 6.2 % — AB (ref 4.0–5.6)

## 2019-10-17 NOTE — Progress Notes (Signed)
Established Patient Office Visit  Subjective:  Patient ID: Lindsey Bray, female    DOB: 1950/06/15  Age: 69 y.o. MRN: [id number]  CC:  Chief Complaint  Patient presents with  . Diabetes    per pt follow up medication x 3 month    HPI Lindsey Bray presents for t2dm follow up  Improvement on thirst and frequent urination Unfortunately having significant GI side effects with metformin - loose stools and abd pain. Has been taking regardless. Continuing lifestyle modifications  No other concerns at this time.  Past Medical History:  Diagnosis Date  . Allergy    as a child and young adult  . Arthritis    Oa left shoulder  . Asthma    as a child and young adult    Past Surgical History:  Procedure Laterality Date  . BREAST SURGERY Right 69 (age 72)   benign breast tumors  . CESAREAN SECTION     x1  . COLONOSCOPY  2004   ~16 yrs ago- was normal   . trigger thumb  05/2017   r hand    Family History  Problem Relation Age of Onset  . Cancer Mother 53       colon- died age 13   . Colon cancer Mother   . COPD Father   . Heart disease Father   . Cancer Sister 56       breast  . Breast cancer Sister   . Cancer Sister 55       breast; had a lumpectomy and is on tamoxifen  . Breast cancer Sister   . Colon polyps Sister   . Esophageal cancer Neg Hx   . Rectal cancer Neg Hx   . Stomach cancer Neg Hx     Social History   Socioeconomic History  . Marital status: Single    Spouse name: Not on file  . Number of children: 1  . Years of education: Not on file  . Highest education level: Not on file  Occupational History  . Not on file  Tobacco Use  . Smoking status: Never Smoker  . Smokeless tobacco: Never Used  Vaping Use  . Vaping Use: Never used  Substance and Sexual Activity  . Alcohol use: No    Alcohol/week: 0.0 standard drinks  . Drug use: Never  . Sexual activity: Not on file  Other Topics Concern  . Not on file  Social History Narrative    Lives by herself in [place]; has a 47 yo son, who is married. No grandchildren.   Social Determinants of Health   Financial Resource Strain:   . Difficulty of Paying Living Expenses:   Food Insecurity:   . Worried About Charity fundraiser in the Last Year:   . Arboriculturist in the Last Year:   Transportation Needs:   . Film/video editor (Medical):   Marland Kitchen Lack of Transportation (Non-Medical):   Physical Activity:   . Days of Exercise per Week:   . Minutes of Exercise per Session:   Stress:   . Feeling of Stress :   Social Connections:   . Frequency of Communication with Friends and Family:   . Frequency of Social Gatherings with Friends and Family:   . Attends Religious Services:   . Active Member of Clubs or Organizations:   . Attends Archivist Meetings:   Marland Kitchen Marital Status:   Intimate Partner Violence:   . Fear of Current or  Ex-Partner:   . Emotionally Abused:   Marland Kitchen Physically Abused:   . Sexually Abused:     Outpatient Medications Prior to Visit  Medication Sig Dispense Refill  . albuterol (VENTOLIN HFA) 108 (90 Base) MCG/ACT inhaler Inhale 2 puffs into the lungs every 4 (four) hours as needed for wheezing or shortness of breath (cough, shortness of breath or wheezing.). 1 Inhaler 1  . ibuprofen (ADVIL) 200 MG tablet Take 200 mg by mouth every 6 (six) hours as needed.    . metFORMIN (GLUCOPHAGE) 500 MG tablet Take 1 tablet (500 mg total) by mouth 2 (two) times daily with a meal. 180 tablet 3  . diclofenac sodium (VOLTAREN) 1 % GEL APP 4 GRAMS AA EXT QID (Patient not taking: Reported on 10/17/2019)     No facility-administered medications prior to visit.    No Known Allergies  ROS Review of Systems  Constitutional: Negative.   HENT: Negative.   Eyes: Negative.   Respiratory: Negative.   Cardiovascular: Negative.   Gastrointestinal: Negative.   Endocrine: Negative.   Genitourinary: Negative.   Musculoskeletal: Negative.   Skin: Negative.     Allergic/Immunologic: Negative.   Neurological: Negative.   Hematological: Negative.   Psychiatric/Behavioral: Negative.   All other systems reviewed and are negative.     Objective:    Physical Exam Vitals and nursing note reviewed.  Constitutional:      General: She is not in acute distress.    Appearance: Normal appearance. She is normal weight. She is not ill-appearing, toxic-appearing or diaphoretic.  HENT:     Head: Normocephalic and atraumatic.  Cardiovascular:     Rate and Rhythm: Normal rate and regular rhythm.  Pulmonary:     Effort: Pulmonary effort is normal. No respiratory distress.  Neurological:     General: No focal deficit present.     Mental Status: She is alert and oriented to person, place, and time. Mental status is at baseline.  Psychiatric:        Mood and Affect: Mood normal.        Behavior: Behavior normal.        Thought Content: Thought content normal.        Judgment: Judgment normal.     BP 135/70   Pulse 78   Temp 98.7 F (37.1 C) (Temporal)   Resp 16   Ht 4\' 11"  (1.499 m)   Wt 154 lb (69.9 kg)   SpO2 99%   BMI 31.10 kg/m  Wt Readings from Last 3 Encounters:  10/17/19 154 lb (69.9 kg)  07/18/19 158 lb 6.4 oz (71.8 kg)  12/22/18 156 lb (70.8 kg)     Health Maintenance Due  Topic Date Due  . Hepatitis C Screening  Never done  . URINE MICROALBUMIN  Never done  . COVID-19 Vaccine (1) Never done    There are no preventive care reminders to display for this patient.  Lab Results  Component Value Date   TSH 2.510 11/29/2018   Lab Results  Component Value Date   WBC 2.7 (L) 11/29/2018   HGB 12.4 11/29/2018   HCT 37.9 11/29/2018   MCV 80 11/29/2018   PLT 207 11/29/2018   Lab Results  Component Value Date   NA 153 (H) 11/29/2018   K 4.9 11/29/2018   CO2 16 (L) 11/29/2018   GLUCOSE 117 (H) 11/29/2018   BUN 11 11/29/2018   CREATININE 0.89 11/29/2018   BILITOT <0.2 11/29/2018   ALKPHOS 75 11/29/2018   AST  26  11/29/2018   ALT 19 11/29/2018   PROT 7.7 11/29/2018   ALBUMIN 4.6 11/29/2018   CALCIUM 9.8 11/29/2018   Lab Results  Component Value Date   CHOL 193 11/29/2018   Lab Results  Component Value Date   HDL 57 11/29/2018   Lab Results  Component Value Date   LDLCALC 116 (H) 11/29/2018   Lab Results  Component Value Date   TRIG 101 11/29/2018   Lab Results  Component Value Date   CHOLHDL 3.4 11/29/2018   Lab Results  Component Value Date   HGBA1C 6.2 (A) 10/17/2019      Assessment & Plan:   Problem List Items Addressed This Visit    None    Visit Diagnoses    Type 2 diabetes mellitus without complication, without long-term current use of insulin (HCC)    -  Primary   Relevant Orders   POCT glycosylated hemoglobin (Hb A1C) (Completed)      No orders of the defined types were placed in this encounter.   Follow-up: No follow-ups on file.   PLAN  A1c has dropped to 6.2% - may stop metformin  Continue lifestyle modifications for t2dm control  Return in 6 mo for a1c check  Patient encouraged to call clinic with any questions, comments, or concerns.  Maximiano Coss, NP

## 2019-10-17 NOTE — Patient Instructions (Signed)
° ° ° °  If you have lab work done today you will be contacted with your lab results within the next 2 weeks.  If you have not heard from us then please contact us. The fastest way to get your results is to register for My Chart. ° ° °IF you received an x-ray today, you will receive an invoice from [facility]. Please contact  Radiology at [phone] with questions or concerns regarding your invoice.  ° °IF you received labwork today, you will receive an invoice from [facility]. Please contact [facility] at [phone] with questions or concerns regarding your invoice.  ° °Our billing staff will not be able to assist you with questions regarding bills from these companies. ° °You will be contacted with the lab results as soon as they are available. The fastest way to get your results is to activate your My Chart account. Instructions are located on the last page of this paperwork. If you have not heard from us regarding the results in 2 weeks, please contact this office. °  ° ° ° °

## 2019-11-21 ENCOUNTER — Telehealth: Payer: Self-pay | Admitting: *Deleted

## 2019-11-21 NOTE — Telephone Encounter (Signed)
No voicemail  Schedule AWV 

## 2019-12-08 ENCOUNTER — Telehealth: Payer: Self-pay | Admitting: *Deleted

## 2019-12-08 NOTE — Telephone Encounter (Signed)
No voicemail set up need to reschedule 8-10 appointment for AWV

## 2019-12-13 ENCOUNTER — Ambulatory Visit: Payer: Self-pay

## 2020-02-06 ENCOUNTER — Encounter: Payer: Self-pay | Admitting: Adult Health Nurse Practitioner

## 2020-04-11 ENCOUNTER — Other Ambulatory Visit: Payer: Self-pay

## 2020-04-11 ENCOUNTER — Ambulatory Visit (INDEPENDENT_AMBULATORY_CARE_PROVIDER_SITE_OTHER): Payer: Medicare HMO | Admitting: Registered Nurse

## 2020-04-11 ENCOUNTER — Encounter: Payer: Self-pay | Admitting: Registered Nurse

## 2020-04-11 VITALS — BP 145/80 | HR 75 | Temp 97.4°F | Resp 18 | Ht 59.0 in | Wt 157.4 lb

## 2020-04-11 DIAGNOSIS — Z23 Encounter for immunization: Secondary | ICD-10-CM

## 2020-04-11 DIAGNOSIS — R1084 Generalized abdominal pain: Secondary | ICD-10-CM | POA: Diagnosis not present

## 2020-04-11 DIAGNOSIS — K5900 Constipation, unspecified: Secondary | ICD-10-CM | POA: Diagnosis not present

## 2020-04-11 DIAGNOSIS — R1030 Lower abdominal pain, unspecified: Secondary | ICD-10-CM | POA: Diagnosis not present

## 2020-04-11 DIAGNOSIS — E119 Type 2 diabetes mellitus without complications: Secondary | ICD-10-CM

## 2020-04-11 LAB — POCT URINALYSIS DIP (CLINITEK)
Bilirubin, UA: NEGATIVE
Blood, UA: NEGATIVE
Glucose, UA: NEGATIVE mg/dL
Ketones, POC UA: NEGATIVE mg/dL
Leukocytes, UA: NEGATIVE
Nitrite, UA: NEGATIVE
POC PROTEIN,UA: NEGATIVE
Spec Grav, UA: 1.025 (ref 1.010–1.025)
Urobilinogen, UA: 1 E.U./dL
pH, UA: 6.5 (ref 5.0–8.0)

## 2020-04-11 LAB — POCT GLYCOSYLATED HEMOGLOBIN (HGB A1C): Hemoglobin A1C: 4.9 % (ref 4.0–5.6)

## 2020-04-11 MED ORDER — POLYETHYLENE GLYCOL 3350 17 GM/SCOOP PO POWD: 17.0000 g | Freq: Two times a day (BID) | ORAL | 1 refills | Status: DC | PRN

## 2020-04-11 MED ORDER — PANTOPRAZOLE SODIUM 40 MG PO TBEC: 40.0000 mg | DELAYED_RELEASE_TABLET | Freq: Every day | ORAL | 3 refills | Status: DC

## 2020-04-11 NOTE — Progress Notes (Signed)
Established Patient Office Visit  Subjective:  Patient ID: Lindsey Bray, female    DOB: 1950-10-19  Age: 69 y.o. MRN: [id number]  CC:  Chief Complaint  Patient presents with  . Abdominal Pain    Patient has some abdominal pain that seems to come and goes.  Per patient she has been urinating frequent and has been making bowel movements but they have not been alot at all.    HPI Lindsey Bray presents for lower abdominal pain.  Ongoing for a few weeks Lower abdomen and left side Has had some firm, small bm - more frequent - require straining - scant amounts. When showed bristol stool chart, strong endorsement of type 1 No nvd, does state some urinary frequency but unsure if this is more than baseline No weight changes No fever, chills, fatigue, sweats  Past Medical History:  Diagnosis Date  . Allergy    as a child and young adult  . Arthritis    Oa left shoulder  . Asthma    as a child and young adult    Past Surgical History:  Procedure Laterality Date  . BREAST SURGERY Right 58 (age 25)   benign breast tumors  . CESAREAN SECTION     x1  . COLONOSCOPY  2004   ~16 yrs ago- was normal   . trigger thumb  05/2017   r hand    Family History  Problem Relation Age of Onset  . Cancer Mother 55       colon- died age 2   . Colon cancer Mother   . COPD Father   . Heart disease Father   . Cancer Sister 38       breast  . Breast cancer Sister   . Cancer Sister 39       breast; had a lumpectomy and is on tamoxifen  . Breast cancer Sister   . Colon polyps Sister   . Esophageal cancer Neg Hx   . Rectal cancer Neg Hx   . Stomach cancer Neg Hx     Social History   Socioeconomic History  . Marital status: Single    Spouse name: Not on file  . Number of children: 1  . Years of education: Not on file  . Highest education level: Not on file  Occupational History  . Not on file  Tobacco Use  . Smoking status: Never Smoker  . Smokeless tobacco: Never Used   Vaping Use  . Vaping Use: Never used  Substance and Sexual Activity  . Alcohol use: No    Alcohol/week: 0.0 standard drinks  . Drug use: Never  . Sexual activity: Not on file  Other Topics Concern  . Not on file  Social History Narrative   Lives by herself in [place]; has a 79 yo son, who is married. No grandchildren.   Social Determinants of Health   Financial Resource Strain:   . Difficulty of Paying Living Expenses: Not on file  Food Insecurity:   . Worried About Charity fundraiser in the Last Year: Not on file  . Ran Out of Food in the Last Year: Not on file  Transportation Needs:   . Lack of Transportation (Medical): Not on file  . Lack of Transportation (Non-Medical): Not on file  Physical Activity:   . Days of Exercise per Week: Not on file  . Minutes of Exercise per Session: Not on file  Stress:   . Feeling of Stress : Not  on file  Social Connections:   . Frequency of Communication with Friends and Family: Not on file  . Frequency of Social Gatherings with Friends and Family: Not on file  . Attends Religious Services: Not on file  . Active Member of Clubs or Organizations: Not on file  . Attends Archivist Meetings: Not on file  . Marital Status: Not on file  Intimate Partner Violence:   . Fear of Current or Ex-Partner: Not on file  . Emotionally Abused: Not on file  . Physically Abused: Not on file  . Sexually Abused: Not on file    Outpatient Medications Prior to Visit  Medication Sig Dispense Refill  . albuterol (VENTOLIN HFA) 108 (90 Base) MCG/ACT inhaler Inhale 2 puffs into the lungs every 4 (four) hours as needed for wheezing or shortness of breath (cough, shortness of breath or wheezing.). 1 Inhaler 1  . diclofenac sodium (VOLTAREN) 1 % GEL APP 4 GRAMS AA EXT QID (Patient not taking: Reported on 10/17/2019)    . ibuprofen (ADVIL) 200 MG tablet Take 200 mg by mouth every 6 (six) hours as needed. (Patient not taking: Reported on 04/11/2020)     . metFORMIN (GLUCOPHAGE) 500 MG tablet Take 1 tablet (500 mg total) by mouth 2 (two) times daily with a meal. (Patient not taking: Reported on 04/11/2020) 180 tablet 3   No facility-administered medications prior to visit.    No Known Allergies  ROS Review of Systems  Constitutional: Negative.   HENT: Negative.   Eyes: Negative.   Respiratory: Negative.   Cardiovascular: Negative.   Gastrointestinal: Positive for abdominal pain and constipation. Negative for abdominal distention, anal bleeding, blood in stool, diarrhea, nausea, rectal pain and vomiting.  Genitourinary: Negative.   Musculoskeletal: Negative.   Skin: Negative.   Neurological: Negative.   Psychiatric/Behavioral: Negative.   All other systems reviewed and are negative.     Objective:    Physical Exam Vitals and nursing note reviewed.  Constitutional:      General: She is not in acute distress.    Appearance: Normal appearance. She is normal weight. She is not ill-appearing, toxic-appearing or diaphoretic.  Cardiovascular:     Rate and Rhythm: Normal rate and regular rhythm.     Heart sounds: Normal heart sounds. No murmur heard.  No friction rub. No gallop.   Pulmonary:     Effort: Pulmonary effort is normal. No respiratory distress.     Breath sounds: Normal breath sounds. No stridor. No wheezing, rhonchi or rales.  Chest:     Chest wall: No tenderness.  Abdominal:     General: Bowel sounds are normal.     Tenderness: There is abdominal tenderness in the periumbilical area and suprapubic area. There is no right CVA tenderness, left CVA tenderness, guarding or rebound. Negative signs include Murphy's sign, Rovsing's sign, McBurney's sign, psoas sign and obturator sign.     Hernia: No hernia is present.  Skin:    General: Skin is warm and dry.  Neurological:     General: No focal deficit present.     Mental Status: She is alert and oriented to person, place, and time. Mental status is at baseline.   Psychiatric:        Mood and Affect: Mood normal.        Behavior: Behavior normal.        Thought Content: Thought content normal.        Judgment: Judgment normal.     BP Marland Kitchen)  145/80   Pulse 75   Temp (!) 97.4 F (36.3 C) (Temporal)   Resp 18   Ht 4\' 11"  (1.499 m)   Wt 157 lb 6.4 oz (71.4 kg)   SpO2 98%   BMI 31.79 kg/m  Wt Readings from Last 3 Encounters:  04/11/20 157 lb 6.4 oz (71.4 kg)  10/17/19 154 lb (69.9 kg)  07/18/19 158 lb 6.4 oz (71.8 kg)     There are no preventive care reminders to display for this patient.  There are no preventive care reminders to display for this patient.  Lab Results  Component Value Date   TSH 2.510 11/29/2018   Lab Results  Component Value Date   WBC 2.7 (L) 11/29/2018   HGB 12.4 11/29/2018   HCT 37.9 11/29/2018   MCV 80 11/29/2018   PLT 207 11/29/2018   Lab Results  Component Value Date   NA 153 (H) 11/29/2018   K 4.9 11/29/2018   CO2 16 (L) 11/29/2018   GLUCOSE 117 (H) 11/29/2018   BUN 11 11/29/2018   CREATININE 0.89 11/29/2018   BILITOT <0.2 11/29/2018   ALKPHOS 75 11/29/2018   AST 26 11/29/2018   ALT 19 11/29/2018   PROT 7.7 11/29/2018   ALBUMIN 4.6 11/29/2018   CALCIUM 9.8 11/29/2018   Lab Results  Component Value Date   CHOL 193 11/29/2018   Lab Results  Component Value Date   HDL 57 11/29/2018   Lab Results  Component Value Date   LDLCALC 116 (H) 11/29/2018   Lab Results  Component Value Date   TRIG 101 11/29/2018   Lab Results  Component Value Date   CHOLHDL 3.4 11/29/2018   Lab Results  Component Value Date   HGBA1C 4.9 04/11/2020      Assessment & Plan:   Problem List Items Addressed This Visit      Endocrine   Type 2 diabetes mellitus without complication, without long-term current use of insulin (HCC)   Relevant Orders   POCT glycosylated hemoglobin (Hb A1C) (Completed)    Other Visit Diagnoses    Constipation, unspecified constipation type    -  Primary   Relevant  Medications   polyethylene glycol powder (GLYCOLAX/MIRALAX) 17 GM/SCOOP powder   Other Relevant Orders   POCT URINALYSIS DIP (CLINITEK) (Completed)   CBC With Differential   Comprehensive metabolic panel   Lipid panel   TSH   Lower abdominal pain       Relevant Orders   POCT URINALYSIS DIP (CLINITEK) (Completed)   CBC With Differential   Comprehensive metabolic panel   Lipid panel   TSH   Need for vaccination       Relevant Orders   Pneumococcal polysaccharide vaccine 23-valent greater than or equal to 2yo subcutaneous/IM (Completed)      Meds ordered this encounter  Medications  . polyethylene glycol powder (GLYCOLAX/MIRALAX) 17 GM/SCOOP powder    Sig: Take 17 g by mouth 2 (two) times daily as needed.    Dispense:  3350 g    Refill:  1    Order Specific Question:   Supervising Provider    Answer:   Carlota Raspberry, JEFFREY R [[id number]]  . pantoprazole (PROTONIX) 40 MG tablet    Sig: Take 1 tablet (40 mg total) by mouth daily.    Dispense:  30 tablet    Refill:  3    Order Specific Question:   Supervising Provider    Answer:   Carlota Raspberry, JEFFREY R [[id number]]    Follow-up:  No follow-ups on file.   PLAN  Will draw routine labs to check for metabolic issues  pna vaccine given  Feel that this is likely constipation. Recommend miralax and pantoprazole  Patient encouraged to call clinic with any questions, comments, or concerns.  Maximiano Coss, NP

## 2020-04-11 NOTE — Patient Instructions (Signed)
° ° ° °  If you have lab work done today you will be contacted with your lab results within the next 2 weeks.  If you have not heard from us then please contact us. The fastest way to get your results is to register for My Chart. ° ° °IF you received an x-ray today, you will receive an invoice from [facility]. Please contact  Radiology at [phone] with questions or concerns regarding your invoice.  ° °IF you received labwork today, you will receive an invoice from [facility]. Please contact [facility] at [phone] with questions or concerns regarding your invoice.  ° °Our billing staff will not be able to assist you with questions regarding bills from these companies. ° °You will be contacted with the lab results as soon as they are available. The fastest way to get your results is to activate your My Chart account. Instructions are located on the last page of this paperwork. If you have not heard from us regarding the results in 2 weeks, please contact this office. °  ° ° ° °

## 2020-04-12 LAB — COMPREHENSIVE METABOLIC PANEL
ALT: 19 IU/L (ref 0–32)
AST: 26 IU/L (ref 0–40)
Albumin/Globulin Ratio: 1.5 (ref 1.2–2.2)
Albumin: 4.6 g/dL (ref 3.8–4.8)
Alkaline Phosphatase: 91 IU/L (ref 44–121)
BUN/Creatinine Ratio: 15 (ref 12–28)
BUN: 12 mg/dL (ref 8–27)
Bilirubin Total: 0.6 mg/dL (ref 0.0–1.2)
CO2: 23 mmol/L (ref 20–29)
Calcium: 9.9 mg/dL (ref 8.7–10.3)
Chloride: 103 mmol/L (ref 96–106)
Creatinine, Ser: 0.8 mg/dL (ref 0.57–1.00)
GFR calc Af Amer: 88 mL/min/{1.73_m2} (ref >59)
GFR calc non Af Amer: 76 mL/min/{1.73_m2} (ref >59)
Globulin, Total: 3 g/dL (ref 1.5–4.5)
Glucose: 93 mg/dL (ref 65–99)
Potassium: 4.2 mmol/L (ref 3.5–5.2)
Sodium: 143 mmol/L (ref 134–144)
Total Protein: 7.6 g/dL (ref 6.0–8.5)

## 2020-04-12 LAB — LIPID PANEL
Chol/HDL Ratio: 3.5 ratio (ref 0.0–4.4)
Cholesterol, Total: 207 mg/dL — ABNORMAL HIGH (ref 100–199)
HDL: 60 mg/dL (ref >39)
LDL Chol Calc (NIH): 130 mg/dL — ABNORMAL HIGH (ref 0–99)
Triglycerides: 94 mg/dL (ref 0–149)
VLDL Cholesterol Cal: 17 mg/dL (ref 5–40)

## 2020-04-12 LAB — CBC WITH DIFFERENTIAL
Basophils Absolute: 0 10*3/uL (ref 0.0–0.2)
Basos: 1 %
EOS (ABSOLUTE): 0.1 10*3/uL (ref 0.0–0.4)
Eos: 2 %
Hematocrit: 42.1 % (ref 34.0–46.6)
Hemoglobin: 13.7 g/dL (ref 11.1–15.9)
Immature Grans (Abs): 0 10*3/uL (ref 0.0–0.1)
Immature Granulocytes: 0 %
Lymphocytes Absolute: 1.3 10*3/uL (ref 0.7–3.1)
Lymphs: 38 %
MCH: 26.6 pg (ref 26.6–33.0)
MCHC: 32.5 g/dL (ref 31.5–35.7)
MCV: 82 fL (ref 79–97)
Monocytes Absolute: 0.4 10*3/uL (ref 0.1–0.9)
Monocytes: 10 %
Neutrophils Absolute: 1.7 10*3/uL (ref 1.4–7.0)
Neutrophils: 49 %
RBC: 5.15 x10E6/uL (ref 3.77–5.28)
RDW: 13.9 % (ref 11.7–15.4)
WBC: 3.4 10*3/uL (ref 3.4–10.8)

## 2020-04-12 LAB — TSH: TSH: 1.86 u[IU]/mL (ref 0.450–4.500)

## 2020-04-17 ENCOUNTER — Ambulatory Visit: Payer: Medicare HMO | Admitting: Registered Nurse

## 2020-05-23 ENCOUNTER — Telehealth (INDEPENDENT_AMBULATORY_CARE_PROVIDER_SITE_OTHER): Payer: Medicare HMO | Admitting: Registered Nurse

## 2020-05-23 ENCOUNTER — Other Ambulatory Visit: Payer: Self-pay

## 2020-05-23 DIAGNOSIS — G43009 Migraine without aura, not intractable, without status migrainosus: Secondary | ICD-10-CM

## 2020-05-23 MED ORDER — SUMATRIPTAN-NAPROXEN SODIUM 85-500 MG PO TABS: 1.0000 | ORAL_TABLET | ORAL | 0 refills | Status: DC | PRN

## 2020-05-23 NOTE — Patient Instructions (Signed)
° ° ° °  If you have lab work done today you will be contacted with your lab results within the next 2 weeks.  If you have not heard from us then please contact us. The fastest way to get your results is to register for My Chart. ° ° °IF you received an x-ray today, you will receive an invoice from [facility]. Please contact [facility] at [phone] with questions or concerns regarding your invoice.  ° °IF you received labwork today, you will receive an invoice from [facility]. Please contact [facility] at [phone] with questions or concerns regarding your invoice.  ° °Our billing staff will not be able to assist you with questions regarding bills from these companies. ° °You will be contacted with the lab results as soon as they are available. The fastest way to get your results is to activate your My Chart account. Instructions are located on the last page of this paperwork. If you have not heard from us regarding the results in 2 weeks, please contact this office. °  ° ° ° °

## 2020-06-25 ENCOUNTER — Ambulatory Visit (INDEPENDENT_AMBULATORY_CARE_PROVIDER_SITE_OTHER): Payer: Medicare HMO | Admitting: Registered Nurse

## 2020-06-25 ENCOUNTER — Other Ambulatory Visit: Payer: Self-pay

## 2020-06-25 ENCOUNTER — Encounter: Payer: Self-pay | Admitting: Registered Nurse

## 2020-06-25 VITALS — BP 148/85 | HR 85 | Temp 98.0°F | Resp 18 | Ht 59.0 in | Wt 152.6 lb

## 2020-06-25 DIAGNOSIS — Z789 Other specified health status: Secondary | ICD-10-CM | POA: Diagnosis not present

## 2020-06-25 NOTE — Patient Instructions (Signed)
° ° ° °  If you have lab work done today you will be contacted with your lab results within the next 2 weeks.  If you have not heard from us then please contact us. The fastest way to get your results is to register for My Chart. ° ° °IF you received an x-ray today, you will receive an invoice from [facility]. Please contact  Radiology at [phone] with questions or concerns regarding your invoice.  ° °IF you received labwork today, you will receive an invoice from [facility]. Please contact [facility] at [phone] with questions or concerns regarding your invoice.  ° °Our billing staff will not be able to assist you with questions regarding bills from these companies. ° °You will be contacted with the lab results as soon as they are available. The fastest way to get your results is to activate your My Chart account. Instructions are located on the last page of this paperwork. If you have not heard from us regarding the results in 2 weeks, please contact this office. °  ° ° ° °

## 2020-06-25 NOTE — Progress Notes (Signed)
Established Patient Office Visit  Subjective:  Patient ID: Lindsey Bray, female    DOB: 15-Oct-1950  Age: 70 y.o. MRN: [id number]  CC:  Chief Complaint  Patient presents with  . note    Patient states she is here to discuss getting a note for a exercise class.     HPI [name] presents for exercise clearance  Participating in a "boot camp" - has been exercising steadily for around 1 year +, no chest pains, shob, doe, headaches, neuro changes, injuries, or other concerns. She is looking to expand her exercise to further her health  Does note hx of asthma. Keeps albuterol inhaler with her always - rarely needs this.   Past Medical History:  Diagnosis Date  . Allergy    as a child and young adult  . Arthritis    Oa left shoulder  . Asthma    as a child and young adult    Past Surgical History:  Procedure Laterality Date  . BREAST SURGERY Right 30 (age 51)   benign breast tumors  . CESAREAN SECTION     x1  . COLONOSCOPY  2004   ~16 yrs ago- was normal   . trigger thumb  05/2017   r hand    Family History  Problem Relation Age of Onset  . Cancer Mother 34       colon- died age 70   . Colon cancer Mother   . COPD Father   . Heart disease Father   . Cancer Sister 43       breast  . Breast cancer Sister   . Cancer Sister 80       breast; had a lumpectomy and is on tamoxifen  . Breast cancer Sister   . Colon polyps Sister   . Esophageal cancer Neg Hx   . Rectal cancer Neg Hx   . Stomach cancer Neg Hx     Social History   Socioeconomic History  . Marital status: Single    Spouse name: Not on file  . Number of children: 1  . Years of education: Not on file  . Highest education level: Not on file  Occupational History  . Not on file  Tobacco Use  . Smoking status: Never Smoker  . Smokeless tobacco: Never Used  Vaping Use  . Vaping Use: Never used  Substance and Sexual Activity  . Alcohol use: No    Alcohol/week: 0.0 standard drinks  .  Drug use: Never  . Sexual activity: Not on file  Other Topics Concern  . Not on file  Social History Narrative   Lives by herself in [place]; has a 9 yo son, who is married. No grandchildren.   Social Determinants of Health   Financial Resource Strain: Not on file  Food Insecurity: Not on file  Transportation Needs: Not on file  Physical Activity: Not on file  Stress: Not on file  Social Connections: Not on file  Intimate Partner Violence: Not on file    Outpatient Medications Prior to Visit  Medication Sig Dispense Refill  . albuterol (VENTOLIN HFA) 108 (90 Base) MCG/ACT inhaler Inhale 2 puffs into the lungs every 4 (four) hours as needed for wheezing or shortness of breath (cough, shortness of breath or wheezing.). 1 Inhaler 1  . pantoprazole (PROTONIX) 40 MG tablet Take 1 tablet (40 mg total) by mouth daily. 30 tablet 3  . polyethylene glycol powder (GLYCOLAX/MIRALAX) 17 GM/SCOOP powder Take 17 g by mouth  2 (two) times daily as needed. 3350 g 1  . SUMAtriptan-naproxen (TREXIMET) 85-500 MG tablet Take 1 tablet by mouth every 2 (two) hours as needed for migraine. Do not exceed two doses in 1 day. 10 tablet 0  . diclofenac sodium (VOLTAREN) 1 % GEL APP 4 GRAMS AA EXT QID (Patient not taking: No sig reported)    . ibuprofen (ADVIL) 200 MG tablet Take 200 mg by mouth every 6 (six) hours as needed. (Patient not taking: No sig reported)    . metFORMIN (GLUCOPHAGE) 500 MG tablet Take 1 tablet (500 mg total) by mouth 2 (two) times daily with a meal. (Patient not taking: No sig reported) 180 tablet 3   No facility-administered medications prior to visit.    No Known Allergies  ROS Review of Systems  Constitutional: Negative.   HENT: Negative.   Eyes: Negative.   Respiratory: Negative.   Cardiovascular: Negative.   Gastrointestinal: Negative.   Genitourinary: Negative.   Musculoskeletal: Negative.   Skin: Negative.   Neurological: Negative.   Psychiatric/Behavioral: Negative.    All other systems reviewed and are negative.     Objective:    Physical Exam Vitals and nursing note reviewed.  Constitutional:      General: She is not in acute distress.    Appearance: Normal appearance. She is normal weight. She is not ill-appearing, toxic-appearing or diaphoretic.  Cardiovascular:     Rate and Rhythm: Normal rate and regular rhythm.     Heart sounds: Normal heart sounds. No murmur heard. No friction rub. No gallop.   Pulmonary:     Effort: Pulmonary effort is normal. No respiratory distress.     Breath sounds: Normal breath sounds. No stridor. No wheezing, rhonchi or rales.  Chest:     Chest wall: No tenderness.  Skin:    General: Skin is warm and dry.  Neurological:     General: No focal deficit present.     Mental Status: She is alert and oriented to person, place, and time. Mental status is at baseline.  Psychiatric:        Mood and Affect: Mood normal.        Behavior: Behavior normal.        Thought Content: Thought content normal.        Judgment: Judgment normal.     BP (!) 148/85   Pulse 85   Temp 98 F (36.7 C) (Temporal)   Resp 18   Ht 4\' 11"  (1.499 m)   Wt 152 lb 9.6 oz (69.2 kg)   SpO2 98%   BMI 30.82 kg/m  Wt Readings from Last 3 Encounters:  06/25/20 152 lb 9.6 oz (69.2 kg)  04/11/20 157 lb 6.4 oz (71.4 kg)  10/17/19 154 lb (69.9 kg)     There are no preventive care reminders to display for this patient.  There are no preventive care reminders to display for this patient.  Lab Results  Component Value Date   TSH 1.860 04/11/2020   Lab Results  Component Value Date   WBC 3.4 04/11/2020   HGB 13.7 04/11/2020   HCT 42.1 04/11/2020   MCV 82 04/11/2020   PLT 207 11/29/2018   Lab Results  Component Value Date   NA 143 04/11/2020   K 4.2 04/11/2020   CO2 23 04/11/2020   GLUCOSE 93 04/11/2020   BUN 12 04/11/2020   CREATININE 0.80 04/11/2020   BILITOT 0.6 04/11/2020   ALKPHOS 91 04/11/2020   AST 26 04/11/2020  ALT 19 04/11/2020   PROT 7.6 04/11/2020   ALBUMIN 4.6 04/11/2020   CALCIUM 9.9 04/11/2020   Lab Results  Component Value Date   CHOL 207 (H) 04/11/2020   Lab Results  Component Value Date   HDL 60 04/11/2020   Lab Results  Component Value Date   LDLCALC 130 (H) 04/11/2020   Lab Results  Component Value Date   TRIG 94 04/11/2020   Lab Results  Component Value Date   CHOLHDL 3.5 04/11/2020   Lab Results  Component Value Date   HGBA1C 4.9 04/11/2020      Assessment & Plan:   Problem List Items Addressed This Visit   None   Visit Diagnoses    Good exercise tolerance    -  Primary      No orders of the defined types were placed in this encounter.   Follow-up: No follow-ups on file.   PLAN  Pt is in good shape - ok to participate in exercise  Discussed limit setting with patient, avoiding injury, avoiding CV or Neuro issues.   Pt very aware of risks and benefits of exercise, will follow up closely with any concerns.  Patient encouraged to call clinic with any questions, comments, or concerns.  Maximiano Coss, NP

## 2020-09-25 ENCOUNTER — Encounter: Payer: Self-pay | Admitting: Registered Nurse

## 2020-09-25 ENCOUNTER — Other Ambulatory Visit: Payer: Self-pay

## 2020-09-25 ENCOUNTER — Ambulatory Visit (INDEPENDENT_AMBULATORY_CARE_PROVIDER_SITE_OTHER): Payer: Medicare HMO | Admitting: Registered Nurse

## 2020-09-25 VITALS — BP 137/73 | HR 96 | Temp 98.1°F | Resp 18 | Ht 59.0 in | Wt 145.0 lb

## 2020-09-25 DIAGNOSIS — E119 Type 2 diabetes mellitus without complications: Secondary | ICD-10-CM | POA: Diagnosis not present

## 2020-09-25 DIAGNOSIS — M542 Cervicalgia: Secondary | ICD-10-CM | POA: Diagnosis not present

## 2020-09-25 LAB — POCT GLYCOSYLATED HEMOGLOBIN (HGB A1C): Hemoglobin A1C: 10.5 % — AB (ref 4.0–5.6)

## 2020-09-25 MED ORDER — METHOCARBAMOL 500 MG PO TABS: 500.0000 mg | ORAL_TABLET | Freq: Every evening | ORAL | 0 refills | Status: DC | PRN

## 2020-09-25 MED ORDER — SITAGLIPTIN PHOSPHATE 25 MG PO TABS: 25.0000 mg | ORAL_TABLET | Freq: Every day | ORAL | 1 refills | Status: DC

## 2020-09-25 NOTE — Addendum Note (Signed)
Addended by: Maximiano Coss on: 09/25/2020 12:09 PM   Modules accepted: Orders

## 2020-09-25 NOTE — Patient Instructions (Addendum)
Lindsey Bray -   Don't worry about the blood sugar too much. You know what to do - I have no doubt the sugars will improve  Let's plan on 3 mo follow up  Start Januvia 25mg  daily. I expect that you'll tolerate this well.  We can add trulicity or farxiga if we need to, but I bet we won't, as I expect you'll get the sugars back under control without issue.  See you soon  Rich    If you have lab work done today you will be contacted with your lab results within the next 2 weeks.  If you have not heard from [place] then please contact us. The fastest way to get your results is to register for My Chart.   IF you received an x-ray today, you will receive an invoice from [facility]. Please contact [facility] at [phone] with questions or concerns regarding your invoice.   IF you received labwork today, you will receive an invoice from [facility]. Please contact [facility] at [phone] with questions or concerns regarding your invoice.   Our billing staff will not be able to assist you with questions regarding bills from these companies.  You will be contacted with the lab results as soon as they are available. The fastest way to get your results is to activate your My Chart account. Instructions are located on the last page of this paperwork. If you have not heard from [place] regarding the results in 2 weeks, please contact this office.

## 2020-09-25 NOTE — Progress Notes (Signed)
Established Patient Office Visit  Subjective:  Patient ID: Lindsey Bray, female    DOB: 12/13/1950  Age: 70 y.o. MRN: [id number]  CC:  Chief Complaint  Patient presents with  . Follow-up    Patient states se is here for a follow up for diabetes. Patient states since last week she has not been able to get a sweet tatse out her mouth.    HPI Lindsey Bray presents for t2dm  Last A1c: 4.9 on 04/11/20 Currently taking: metformin 500mg  PO bid ac - has been off of this for a few months. No new complications  Diet has been steady Exercise habits have been improved, completed boot camp that we had discussed at last visit.   Does note that metformin in the past had upset her stomach somewhat, she is hesitant to resume this at this time  Does note sore neck ongoing since starting boot camp. Waxes and wanes. Better with stretching. No radicular symptoms or apparent myelopathy. No distinct injury she can remember.   Past Medical History:  Diagnosis Date  . Allergy    as a child and young adult  . Arthritis    Oa left shoulder  . Asthma    as a child and young adult    Past Surgical History:  Procedure Laterality Date  . BREAST SURGERY Right 5 (age 38)   benign breast tumors  . CESAREAN SECTION     x1  . COLONOSCOPY  2004   ~16 yrs ago- was normal   . trigger thumb  05/2017   r hand    Family History  Problem Relation Age of Onset  . Cancer Mother 81       colon- died age 85   . Colon cancer Mother   . COPD Father   . Heart disease Father   . Cancer Sister 16       breast  . Breast cancer Sister   . Cancer Sister 58       breast; had a lumpectomy and is on tamoxifen  . Breast cancer Sister   . Colon polyps Sister   . Esophageal cancer Neg Hx   . Rectal cancer Neg Hx   . Stomach cancer Neg Hx     Social History   Socioeconomic History  . Marital status: Single    Spouse name: Not on file  . Number of children: 1  . Years of education: Not on file   . Highest education level: Not on file  Occupational History  . Not on file  Tobacco Use  . Smoking status: Never Smoker  . Smokeless tobacco: Never Used  Vaping Use  . Vaping Use: Never used  Substance and Sexual Activity  . Alcohol use: No    Alcohol/week: 0.0 standard drinks  . Drug use: Never  . Sexual activity: Not on file  Other Topics Concern  . Not on file  Social History Narrative   Lives by herself in [place]; has a 34 yo son, who is married. No grandchildren.   Social Determinants of Health   Financial Resource Strain: Not on file  Food Insecurity: Not on file  Transportation Needs: Not on file  Physical Activity: Not on file  Stress: Not on file  Social Connections: Not on file  Intimate Partner Violence: Not on file    Outpatient Medications Prior to Visit  Medication Sig Dispense Refill  . albuterol (VENTOLIN HFA) 108 (90 Base) MCG/ACT inhaler Inhale 2 puffs into the  lungs every 4 (four) hours as needed for wheezing or shortness of breath (cough, shortness of breath or wheezing.). 1 Inhaler 1  . pantoprazole (PROTONIX) 40 MG tablet Take 1 tablet (40 mg total) by mouth daily. 30 tablet 3  . polyethylene glycol powder (GLYCOLAX/MIRALAX) 17 GM/SCOOP powder Take 17 g by mouth 2 (two) times daily as needed. 3350 g 1  . SUMAtriptan-naproxen (TREXIMET) 85-500 MG tablet Take 1 tablet by mouth every 2 (two) hours as needed for migraine. Do not exceed two doses in 1 day. 10 tablet 0  . diclofenac sodium (VOLTAREN) 1 % GEL APP 4 GRAMS AA EXT QID (Patient not taking: No sig reported)    . ibuprofen (ADVIL) 200 MG tablet Take 200 mg by mouth every 6 (six) hours as needed. (Patient not taking: No sig reported)    . metFORMIN (GLUCOPHAGE) 500 MG tablet Take 1 tablet (500 mg total) by mouth 2 (two) times daily with a meal. (Patient not taking: No sig reported) 180 tablet 3   No facility-administered medications prior to visit.    No Known Allergies  ROS Review of  Systems  Constitutional: Negative.   HENT: Negative.   Eyes: Negative.   Respiratory: Negative.   Cardiovascular: Negative.   Gastrointestinal: Negative.   Genitourinary: Negative.   Musculoskeletal: Negative.   Skin: Negative.   Neurological: Negative.   Psychiatric/Behavioral: Negative.   All other systems reviewed and are negative.     Objective:    Physical Exam Vitals and nursing note reviewed.  Constitutional:      General: She is not in acute distress.    Appearance: Normal appearance. She is normal weight. She is not ill-appearing, toxic-appearing or diaphoretic.  Cardiovascular:     Rate and Rhythm: Normal rate and regular rhythm.     Heart sounds: Normal heart sounds. No murmur heard. No friction rub. No gallop.   Pulmonary:     Effort: Pulmonary effort is normal. No respiratory distress.     Breath sounds: Normal breath sounds. No stridor. No wheezing, rhonchi or rales.  Chest:     Chest wall: No tenderness.  Skin:    General: Skin is warm and dry.  Neurological:     General: No focal deficit present.     Mental Status: She is alert and oriented to person, place, and time. Mental status is at baseline.  Psychiatric:        Mood and Affect: Mood normal.        Behavior: Behavior normal.        Thought Content: Thought content normal.        Judgment: Judgment normal.     BP 137/73   Pulse 96   Temp 98.1 F (36.7 C) (Temporal)   Resp 18   Ht 4\' 11"  (1.499 m)   Wt 145 lb (65.8 kg)   SpO2 99%   BMI 29.29 kg/m  Wt Readings from Last 3 Encounters:  09/25/20 145 lb (65.8 kg)  06/25/20 152 lb 9.6 oz (69.2 kg)  04/11/20 157 lb 6.4 oz (71.4 kg)     Health Maintenance Due  Topic Date Due  . URINE MICROALBUMIN  Never done    There are no preventive care reminders to display for this patient.  Lab Results  Component Value Date   TSH 1.860 04/11/2020   Lab Results  Component Value Date   WBC 3.4 04/11/2020   HGB 13.7 04/11/2020   HCT 42.1  04/11/2020   MCV 82 04/11/2020  PLT 207 11/29/2018   Lab Results  Component Value Date   NA 143 04/11/2020   K 4.2 04/11/2020   CO2 23 04/11/2020   GLUCOSE 93 04/11/2020   BUN 12 04/11/2020   CREATININE 0.80 04/11/2020   BILITOT 0.6 04/11/2020   ALKPHOS 91 04/11/2020   AST 26 04/11/2020   ALT 19 04/11/2020   PROT 7.6 04/11/2020   ALBUMIN 4.6 04/11/2020   CALCIUM 9.9 04/11/2020   Lab Results  Component Value Date   CHOL 207 (H) 04/11/2020   Lab Results  Component Value Date   HDL 60 04/11/2020   Lab Results  Component Value Date   LDLCALC 130 (H) 04/11/2020   Lab Results  Component Value Date   TRIG 94 04/11/2020   Lab Results  Component Value Date   CHOLHDL 3.5 04/11/2020   Lab Results  Component Value Date   HGBA1C 10.5 (A) 09/25/2020      Assessment & Plan:   Problem List Items Addressed This Visit      Endocrine   Type 2 diabetes mellitus without complication, without long-term current use of insulin (HCC) - Primary   Relevant Orders   POCT glycosylated hemoglobin (Hb A1C) (Completed)      No orders of the defined types were placed in this encounter.   Follow-up: No follow-ups on file.   PLAN  Start [place] 25mg  PO qd.   Today's a1c up to 10.5  Can consider adding trulicity 0.75mg /0.60mL subq once weekly  Return in 3 mo   Methocarbamol and stretching for neck stiffness.  Patient encouraged to call clinic with any questions, comments, or concerns.  Maximiano Coss, NP

## 2020-10-08 ENCOUNTER — Telehealth: Payer: Self-pay

## 2020-10-08 NOTE — Telephone Encounter (Signed)
Patient has called in stating she has just started diabetic medication.  States she did not realize she was to use a meter.    Patient states she is not feeling well.  States she is having blurred vision and nausea.    I have sent patient to team health for triage.    Please follow back up in regard.

## 2020-10-30 ENCOUNTER — Emergency Department (HOSPITAL_COMMUNITY)
Admission: EM | Admit: 2020-10-30 | Discharge: 2020-10-30 | Disposition: A | Discharge status: Home / Self Care | Payer: Medicare HMO | Attending: Emergency Medicine | Admitting: Emergency Medicine

## 2020-10-30 ENCOUNTER — Emergency Department (HOSPITAL_COMMUNITY): Payer: Medicare HMO

## 2020-10-30 ENCOUNTER — Encounter (HOSPITAL_COMMUNITY): Payer: Self-pay

## 2020-10-30 DIAGNOSIS — I700 Atherosclerosis of aorta: Secondary | ICD-10-CM | POA: Diagnosis not present

## 2020-10-30 DIAGNOSIS — Z7984 Long term (current) use of oral hypoglycemic drugs: Secondary | ICD-10-CM | POA: Diagnosis not present

## 2020-10-30 DIAGNOSIS — K5900 Constipation, unspecified: Secondary | ICD-10-CM | POA: Diagnosis not present

## 2020-10-30 DIAGNOSIS — E119 Type 2 diabetes mellitus without complications: Secondary | ICD-10-CM | POA: Diagnosis not present

## 2020-10-30 DIAGNOSIS — J45909 Unspecified asthma, uncomplicated: Secondary | ICD-10-CM | POA: Diagnosis not present

## 2020-10-30 DIAGNOSIS — E875 Hyperkalemia: Secondary | ICD-10-CM

## 2020-10-30 DIAGNOSIS — R109 Unspecified abdominal pain: Secondary | ICD-10-CM

## 2020-10-30 DIAGNOSIS — E876 Hypokalemia: Secondary | ICD-10-CM | POA: Insufficient documentation

## 2020-10-30 LAB — CBC
HCT: 45.8 % (ref 36.0–46.0)
Hemoglobin: 14.8 g/dL (ref 12.0–15.0)
MCH: 26.9 pg (ref 26.0–34.0)
MCHC: 32.3 g/dL (ref 30.0–36.0)
MCV: 83.3 fL (ref 80.0–100.0)
Platelets: 158 10*3/uL (ref 150–400)
RBC: 5.5 MIL/uL — ABNORMAL HIGH (ref 3.87–5.11)
RDW: 14.3 % (ref 11.5–15.5)
WBC: 3.6 10*3/uL — ABNORMAL LOW (ref 4.0–10.5)
nRBC: 0 % (ref 0.0–0.2)

## 2020-10-30 LAB — COMPREHENSIVE METABOLIC PANEL
ALT: 20 U/L (ref 0–44)
AST: 40 U/L (ref 15–41)
Albumin: 4.5 g/dL (ref 3.5–5.0)
Alkaline Phosphatase: 74 U/L (ref 38–126)
Anion gap: 14 (ref 5–15)
BUN: 12 mg/dL (ref 8–23)
CO2: 22 mmol/L (ref 22–32)
Calcium: 9.8 mg/dL (ref 8.9–10.3)
Chloride: 99 mmol/L (ref 98–111)
Creatinine, Ser: 0.7 mg/dL (ref 0.44–1.00)
GFR, Estimated: >60 mL/min (ref >60)
Glucose, Bld: 361 mg/dL — ABNORMAL HIGH (ref 70–99)
Potassium: 5.3 mmol/L — ABNORMAL HIGH (ref 3.5–5.1)
Sodium: 135 mmol/L (ref 135–145)
Total Bilirubin: 1.5 mg/dL — ABNORMAL HIGH (ref 0.3–1.2)
Total Protein: 8.1 g/dL (ref 6.5–8.1)

## 2020-10-30 LAB — URINALYSIS, ROUTINE W REFLEX MICROSCOPIC
Bacteria, UA: NONE SEEN
Bilirubin Urine: NEGATIVE
Glucose, UA: >=500 mg/dL — AB
Hgb urine dipstick: NEGATIVE
Ketones, ur: 80 mg/dL — AB
Leukocytes,Ua: NEGATIVE
Nitrite: NEGATIVE
Protein, ur: NEGATIVE mg/dL
Specific Gravity, Urine: 1.032 — ABNORMAL HIGH (ref 1.005–1.030)
pH: 5 (ref 5.0–8.0)

## 2020-10-30 LAB — LIPASE, BLOOD: Lipase: 21 U/L (ref 11–51)

## 2020-10-30 MED ORDER — KETOROLAC TROMETHAMINE 60 MG/2ML IM SOLN
30.0000 mg | Freq: Once | INTRAMUSCULAR | Status: AC
  Administered 2020-10-30: 30 mg via INTRAMUSCULAR
  Filled 2020-10-30: qty 2

## 2020-10-30 NOTE — Discharge Instructions (Addendum)
Your work-up overall was reassuring.  The CT scan did not show any acute findings.  Recommend taking Tylenol or anti-inflammatory as needed for pain control.  If you develop any significant worsening of the pain, vomiting, fever, come back to ER.  On your blood work your potassium level was noted to be 5.3.  Please discuss this finding with your primary doctor and request repeat lab testing within the next few days for closer monitoring.

## 2020-10-30 NOTE — ED Provider Notes (Signed)
[facility] DEPT Provider Note   CSN: [id number] Arrival date & time: 10/30/20  1104     History Chief Complaint  Patient presents with   Abdominal Pain    Left lower/mid   Flank Pain    left    Athziri A Kallenberger is a 70 y.o. female.  Presents to ER with concern for flank pain.  Pain is worse on left side.  States that ongoing since last night.  Relatively constant.  No associated symptoms.  No nausea or vomiting.  No fevers.  No generalized abdominal pain, no dysuria or hematuria.  No chest pain or difficulty in breathing.  States that on Friday she was doing some sort of exercise class and doing lots of repetitive movements that she normally does not do.  Concerned maybe she pulled a muscle.  HPI     Past Medical History:  Diagnosis Date   Allergy    as a child and young adult   Arthritis    Oa left shoulder   Asthma    as a child and young adult    Patient Active Problem List   Diagnosis Date Noted   Type 2 diabetes mellitus without complication, without long-term current use of insulin ([place]) 10/17/2019   Mild intermittent asthma without complication 42/70/6237   Family history of colon cancer in mother 09/13/2018   Postmenopausal estrogen deficiency 09/13/2018   Pain of left shoulder joint on movement 05/07/2018   Trigger finger of right hand 05/22/2017   Family history of breast cancer in sister 03/09/2015    Past Surgical History:  Procedure Laterality Date   BREAST SURGERY Right 1970 (age 47)   benign breast tumors   CESAREAN SECTION     x1   COLONOSCOPY  2004   ~16 yrs ago- was normal    trigger thumb  05/2017   r hand     OB History   No obstetric history on file.     Family History  Problem Relation Age of Onset   Cancer Mother 68       colon- died age 41    Colon cancer Mother    COPD Father    Heart disease Father    Cancer Sister 19       breast   Breast cancer Sister    Cancer Sister 35       breast; had  a lumpectomy and is on tamoxifen   Breast cancer Sister    Colon polyps Sister    Esophageal cancer Neg Hx    Rectal cancer Neg Hx    Stomach cancer Neg Hx     Social History   Tobacco Use   Smoking status: Never   Smokeless tobacco: Never  Vaping Use   Vaping Use: Never used  Substance Use Topics   Alcohol use: No    Alcohol/week: 0.0 standard drinks   Drug use: Never    Home Medications Prior to Admission medications   Medication Sig Start Date End Date Taking? Authorizing Provider  albuterol (VENTOLIN HFA) 108 (90 Base) MCG/ACT inhaler Inhale 2 puffs into the lungs every 4 (four) hours as needed for wheezing or shortness of breath (cough, shortness of breath or wheezing.). 09/13/18   Jacelyn Pi, Lilia Argue, MD  diclofenac sodium (VOLTAREN) 1 % GEL APP 4 GRAMS AA EXT QID Patient not taking: No sig reported 05/07/18   [provider]  ibuprofen (ADVIL) 200 MG tablet Take 200 mg by mouth every  6 (six) hours as needed. Patient not taking: No sig reported    [provider]  metFORMIN (GLUCOPHAGE) 500 MG tablet Take 1 tablet (500 mg total) by mouth 2 (two) times daily with a meal. Patient not taking: No sig reported 07/18/19   Maximiano Coss, NP  methocarbamol (ROBAXIN) 500 MG tablet Take 1 tablet (500 mg total) by mouth at bedtime as needed for muscle spasms. 09/25/20   Maximiano Coss, NP  pantoprazole (PROTONIX) 40 MG tablet Take 1 tablet (40 mg total) by mouth daily. 04/11/20   Maximiano Coss, NP  polyethylene glycol powder (GLYCOLAX/MIRALAX) 17 GM/SCOOP powder Take 17 g by mouth 2 (two) times daily as needed. 04/11/20   Maximiano Coss, NP  sitaGLIPtin (JANUVIA) 25 MG tablet Take 1 tablet (25 mg total) by mouth daily. 09/25/20   Maximiano Coss, NP  SUMAtriptan-naproxen (TREXIMET) 85-500 MG tablet Take 1 tablet by mouth every 2 (two) hours as needed for migraine. Do not exceed two doses in 1 day. 05/23/20   Maximiano Coss, NP    Allergies    Patient has no known  allergies.  Review of Systems   Review of Systems  Constitutional:  Negative for chills and fever.  HENT:  Negative for ear pain and sore throat.   Eyes:  Negative for pain and visual disturbance.  Respiratory:  Negative for cough and shortness of breath.   Cardiovascular:  Negative for chest pain and palpitations.  Gastrointestinal:  Negative for abdominal pain and vomiting.  Genitourinary:  Positive for flank pain. Negative for dysuria and hematuria.  Musculoskeletal:  Negative for arthralgias and back pain.  Skin:  Negative for color change and rash.  Neurological:  Negative for seizures and syncope.  All other systems reviewed and are negative.  Physical Exam Updated Vital Signs BP (!) 143/85 (BP Location: Left Arm)   Pulse 80   Temp 98.2 F (36.8 C) (Oral)   Resp 16   SpO2 100%   Physical Exam Vitals and nursing note reviewed.  Constitutional:      General: She is not in acute distress.    Appearance: She is well-developed.  HENT:     Head: Normocephalic and atraumatic.  Eyes:     Conjunctiva/sclera: Conjunctivae normal.  Cardiovascular:     Rate and Rhythm: Normal rate and regular rhythm.     Heart sounds: No murmur heard. Pulmonary:     Effort: Pulmonary effort is normal. No respiratory distress.     Breath sounds: Normal breath sounds.  Abdominal:     Palpations: Abdomen is soft.     Tenderness: There is no abdominal tenderness.  Musculoskeletal:     Cervical back: Neck supple.     Comments: Does have some tenderness over the left flank, no CVA tenderness  Skin:    General: Skin is warm and dry.  Neurological:     Mental Status: She is alert.    ED Results / Procedures / Treatments   Labs (all labs ordered are listed, but only abnormal results are displayed) Labs Reviewed  COMPREHENSIVE METABOLIC PANEL - Abnormal; Notable for the following components:      Result Value   Potassium 5.3 (*)    Glucose, Bld 361 (*)    Total Bilirubin 1.5 (*)    All  other components within normal limits  CBC - Abnormal; Notable for the following components:   WBC 3.6 (*)    RBC 5.50 (*)    All other components within normal limits  URINALYSIS,  ROUTINE W REFLEX MICROSCOPIC - Abnormal; Notable for the following components:   Specific Gravity, Urine 1.032 (*)    Glucose, UA >=500 (*)    Ketones, ur 80 (*)    All other components within normal limits  LIPASE, BLOOD    EKG None  Radiology CT Renal Stone Study  Result Date: 10/30/2020 CLINICAL DATA:  Left flank pain since last night constipation EXAM: CT ABDOMEN AND PELVIS WITHOUT CONTRAST TECHNIQUE: Multidetector CT imaging of the abdomen and pelvis was performed following the standard protocol without IV contrast. COMPARISON:  None. FINDINGS: Lower chest: No acute abnormality. Hepatobiliary: No solid liver abnormality is seen. No gallstones, gallbladder wall thickening, or biliary dilatation. Pancreas: Unremarkable. No pancreatic ductal dilatation or surrounding inflammatory changes. Spleen: Normal in size without significant abnormality. Adrenals/Urinary Tract: Adrenal glands are unremarkable. Kidneys are normal, without renal calculi, solid lesion, or hydronephrosis. Bladder is unremarkable. Stomach/Bowel: Stomach is within normal limits. Appendix appears normal. No evidence of bowel wall thickening, distention, or inflammatory changes. Moderate burden of stool throughout the colon. Vascular/Lymphatic: Aortic atherosclerosis. No enlarged abdominal or pelvic lymph nodes. Reproductive: No mass or other significant abnormality. Other: No abdominal wall hernia or abnormality. No abdominopelvic ascites. Musculoskeletal: No acute or significant osseous findings. IMPRESSION: 1. No non-contrast CT findings of the abdomen or pelvis to explain left flank pain. No evidence of urinary tract calculus or hydronephrosis. 2.  Moderate burden of stool. Aortic Atherosclerosis (ICD10-I70.0). Electronically Signed   By: Eddie Candle M.D.   On: 10/30/2020 15:06    Procedures Procedures   Medications Ordered in ED Medications  ketorolac (TORADOL) injection 30 mg (30 mg Intramuscular Given 10/30/20 1213)    ED Course  I have reviewed the triage vital signs and the nursing notes.  Pertinent labs & imaging results that were available during my care of the patient were reviewed by me and considered in my medical decision making (see chart for details).    MDM Rules/Calculators/A&P                          70 year old lady presenting to ER with concern for left flank pain.  On exam she appears well in no distress.  Did have some tenderness on the left flank.  Basic labs were stable, urinalysis negative.  Noted borderline hyperkalemia.  Potassium 5.3.  CT scan negative for acute abdominal pelvic pathology.  Given reassuring work-up, will discharge patient home and recommended follow-up with primary doctor.  Ultimately given this work-up and the description of pain, suspect more likely MSK in etiology.  Regarding the mild hyperkalemia, discussed this finding in detail with patient.  No obvious offending agents and review of her medication list, no significant dietary changes recently, no renal failure.  Given current level, recommend patient discuss with primary doctor and request close repeat BMP within the next few days.  Patient demonstrated good understanding and discharged.  After the discussed management above, the patient was determined to be safe for discharge.  The patient was in agreement with this plan and all questions regarding their care were answered.  ED return precautions were discussed and the patient will return to the ED with any significant worsening of condition.  Final Clinical Impression(s) / ED Diagnoses Final diagnoses:  Flank pain  Hyperkalemia    Rx / DC Orders ED Discharge Orders     None        Lucrezia Starch, MD 10/31/20 [phone]

## 2020-10-30 NOTE — ED Triage Notes (Addendum)
Patient reports left lower/mid pain that radiates to left flank pain that started last night.   8/10 pain   C/o constipation and taking otc Murelax  C/o intermittent nausea and frequency urinating and believes its related to er diabetes medication.   A/ox4 Ambulatory in triage

## 2020-11-06 ENCOUNTER — Other Ambulatory Visit: Payer: Self-pay

## 2020-11-06 ENCOUNTER — Ambulatory Visit (INDEPENDENT_AMBULATORY_CARE_PROVIDER_SITE_OTHER): Payer: Medicare HMO | Admitting: Registered Nurse

## 2020-11-06 ENCOUNTER — Encounter: Payer: Self-pay | Admitting: Registered Nurse

## 2020-11-06 VITALS — BP 132/68 | HR 83 | Temp 98.1°F | Resp 118 | Ht 59.0 in | Wt 137.4 lb

## 2020-11-06 DIAGNOSIS — E875 Hyperkalemia: Secondary | ICD-10-CM

## 2020-11-06 DIAGNOSIS — R739 Hyperglycemia, unspecified: Secondary | ICD-10-CM

## 2020-11-06 DIAGNOSIS — E119 Type 2 diabetes mellitus without complications: Secondary | ICD-10-CM

## 2020-11-06 LAB — COMPREHENSIVE METABOLIC PANEL
ALT: 16 U/L (ref 0–35)
AST: 19 U/L (ref 0–37)
Albumin: 4.7 g/dL (ref 3.5–5.2)
Alkaline Phosphatase: 75 U/L (ref 39–117)
BUN: 14 mg/dL (ref 6–23)
CO2: 24 mEq/L (ref 19–32)
Calcium: 10.1 mg/dL (ref 8.4–10.5)
Chloride: 97 mEq/L (ref 96–112)
Creatinine, Ser: 0.84 mg/dL (ref 0.40–1.20)
GFR: 71.04 mL/min (ref >60.00)
Glucose, Bld: 322 mg/dL — ABNORMAL HIGH (ref 70–99)
Potassium: 4.3 mEq/L (ref 3.5–5.1)
Sodium: 136 mEq/L (ref 135–145)
Total Bilirubin: 0.8 mg/dL (ref 0.2–1.2)
Total Protein: 7.9 g/dL (ref 6.0–8.3)

## 2020-11-06 LAB — CBC WITH DIFFERENTIAL/PLATELET
Eosinophils Relative: 4 % (ref 0.0–5.0)
HCT: 43.4 % (ref 36.0–46.0)
Hemoglobin: 14.3 g/dL (ref 12.0–15.0)
Lymphocytes Relative: 41 % (ref 12.0–46.0)
MCHC: 33 g/dL (ref 30.0–36.0)
MCV: 82.8 fl (ref 78.0–100.0)
Monocytes Relative: 3 % (ref 3.0–12.0)
Neutrophils Relative %: 52 % (ref 43.0–77.0)
Platelets: 168 10*3/uL (ref 150.0–400.0)
RBC: 5.24 Mil/uL — ABNORMAL HIGH (ref 3.87–5.11)
RDW: 14.4 % (ref 11.5–15.5)
WBC: 2.9 10*3/uL — ABNORMAL LOW (ref 4.0–10.5)

## 2020-11-06 LAB — TSH: TSH: 2.44 u[IU]/mL (ref 0.35–5.50)

## 2020-11-06 LAB — HEMOGLOBIN A1C: Hgb A1c MFr Bld: 14.6 % — ABNORMAL HIGH (ref 4.6–6.5)

## 2020-11-06 MED ORDER — DEXCOM G5 RECEIVER KIT DEVI: 1.0000 | Freq: Once | 0 refills | Status: AC

## 2020-11-06 NOTE — Progress Notes (Signed)
Established Patient Office Visit  Subjective:  Patient ID: Lindsey Bray, female    DOB: 08/08/1950  Age: 70 y.o. MRN: [id number]  CC:  Chief Complaint  Patient presents with   Hospitalization Follow-up    Patient states she went to the ER and her potassium was elevated after she went for side pain and she thinks it has got worse.    HPI Lindsey Bray presents for ER follow up   Was seen with likely MSK pain on 10/30/20 Noted to have hyperkalemia to 5.3 at that time with glucose 360s and ketonuria 80, glycosuria, and high urine gravity, otherwise fairly benign labs.   Of note, she is avid exerciser - had done a particularly intense workout the day before ER visit.  MSK pain has improved/near resolved.  Today is concerned her hyperkalemia has worsened. No acute symptoms to suggest this, but notes she "does not feel like herself". Did have an episode previously of 3 weeks of fatigue and malaise - started late May. Accompanied by severe nausea. No diarrhea or constipation. Low appetite. Notes weight loss since last visit - down around 8 lbs, but ct abd/pelv from ER reassuring, showing only some moderate burden of stool that does not correlate clinically.  Of note, hx of uncontrolled t2dm with last a1c at 10.3 without history of complication or DKA. She reports good compliance with medication and continues to endorse healthy lifestyle. Sugars in ER elevated to 360s, not fasting. She does report taking her sugars at home a few times each day, results usually in 300s when fasting, sometimes over 400. Has been improving her diet steadily since last visit and has been very compliant with medications.  Past Medical History:  Diagnosis Date   Allergy    as a child and young adult   Arthritis    Oa left shoulder   Asthma    as a child and young adult    Past Surgical History:  Procedure Laterality Date   BREAST SURGERY Right 52 (age 54)   benign breast tumors   CESAREAN SECTION      x1   COLONOSCOPY  2004   ~16 yrs ago- was normal    trigger thumb  05/2017   r hand    Family History  Problem Relation Age of Onset   Cancer Mother 59       colon- died age 71    Colon cancer Mother    COPD Father    Heart disease Father    Cancer Sister 18       breast   Breast cancer Sister    Cancer Sister 38       breast; had a lumpectomy and is on tamoxifen   Breast cancer Sister    Colon polyps Sister    Esophageal cancer Neg Hx    Rectal cancer Neg Hx    Stomach cancer Neg Hx     Social History   Socioeconomic History   Marital status: Single    Spouse name: Not on file   Number of children: 1   Years of education: Not on file   Highest education level: Not on file  Occupational History   Not on file  Tobacco Use   Smoking status: Never   Smokeless tobacco: Never  Vaping Use   Vaping Use: Never used  Substance and Sexual Activity   Alcohol use: No    Alcohol/week: 0.0 standard drinks   Drug use: Never   Sexual  activity: Not on file  Other Topics Concern   Not on file  Social History Narrative   Lives by herself in [place]; has a 59 yo son, who is married. No grandchildren.   Social Determinants of Health   Financial Resource Strain: Not on file  Food Insecurity: Not on file  Transportation Needs: Not on file  Physical Activity: Not on file  Stress: Not on file  Social Connections: Not on file  Intimate Partner Violence: Not on file    Outpatient Medications Prior to Visit  Medication Sig Dispense Refill   albuterol (VENTOLIN HFA) 108 (90 Base) MCG/ACT inhaler Inhale 2 puffs into the lungs every 4 (four) hours as needed for wheezing or shortness of breath (cough, shortness of breath or wheezing.). 1 Inhaler 1   methocarbamol (ROBAXIN) 500 MG tablet Take 1 tablet (500 mg total) by mouth at bedtime as needed for muscle spasms. 60 tablet 0   pantoprazole (PROTONIX) 40 MG tablet Take 1 tablet (40 mg total) by mouth daily. 30 tablet 3    polyethylene glycol powder (GLYCOLAX/MIRALAX) 17 GM/SCOOP powder Take 17 g by mouth 2 (two) times daily as needed. 3350 g 1   sitaGLIPtin (JANUVIA) 25 MG tablet Take 1 tablet (25 mg total) by mouth daily. 90 tablet 1   SUMAtriptan-naproxen (TREXIMET) 85-500 MG tablet Take 1 tablet by mouth every 2 (two) hours as needed for migraine. Do not exceed two doses in 1 day. 10 tablet 0   diclofenac sodium (VOLTAREN) 1 % GEL APP 4 GRAMS AA EXT QID (Patient not taking: No sig reported)     ibuprofen (ADVIL) 200 MG tablet Take 200 mg by mouth every 6 (six) hours as needed. (Patient not taking: No sig reported)     metFORMIN (GLUCOPHAGE) 500 MG tablet Take 1 tablet (500 mg total) by mouth 2 (two) times daily with a meal. (Patient not taking: No sig reported) 180 tablet 3   No facility-administered medications prior to visit.    No Known Allergies  ROS Review of Systems  Constitutional: Negative.   HENT: Negative.    Eyes: Negative.   Respiratory: Negative.    Cardiovascular: Negative.   Gastrointestinal: Negative.   Genitourinary: Negative.   Musculoskeletal: Negative.   Skin: Negative.   Neurological: Negative.   Psychiatric/Behavioral: Negative.    All other systems reviewed and are negative.    Objective:    Physical Exam Vitals and nursing note reviewed.  Constitutional:      General: She is not in acute distress.    Appearance: Normal appearance. She is normal weight. She is not ill-appearing, toxic-appearing or diaphoretic.  Cardiovascular:     Rate and Rhythm: Normal rate and regular rhythm.     Heart sounds: Normal heart sounds. No murmur heard.   No friction rub. No gallop.  Pulmonary:     Effort: Pulmonary effort is normal. No respiratory distress.     Breath sounds: Normal breath sounds. No stridor. No wheezing, rhonchi or rales.  Chest:     Chest wall: No tenderness.  Skin:    General: Skin is warm and dry.  Neurological:     General: No focal deficit present.      Mental Status: She is alert and oriented to person, place, and time. Mental status is at baseline.  Psychiatric:        Mood and Affect: Mood normal.        Behavior: Behavior normal.        Thought Content:  Thought content normal.        Judgment: Judgment normal.    BP 132/68   Pulse 83   Temp 98.1 F (36.7 C) (Temporal)   Resp (!) 118   Ht 4\' 11"  (1.499 m)   Wt 137 lb 6.4 oz (62.3 kg)   SpO2 99%   BMI 27.75 kg/m  Wt Readings from Last 3 Encounters:  11/06/20 137 lb 6.4 oz (62.3 kg)  09/25/20 145 lb (65.8 kg)  06/25/20 152 lb 9.6 oz (69.2 kg)     There are no preventive care reminders to display for this patient.  There are no preventive care reminders to display for this patient.  Lab Results  Component Value Date   TSH 1.860 04/11/2020   Lab Results  Component Value Date   WBC 3.6 (L) 10/30/2020   HGB 14.8 10/30/2020   HCT 45.8 10/30/2020   MCV 83.3 10/30/2020   PLT 158 10/30/2020   Lab Results  Component Value Date   NA 135 10/30/2020   K 5.3 (H) 10/30/2020   CO2 22 10/30/2020   GLUCOSE 361 (H) 10/30/2020   BUN 12 10/30/2020   CREATININE 0.70 10/30/2020   BILITOT 1.5 (H) 10/30/2020   ALKPHOS 74 10/30/2020   AST 40 10/30/2020   ALT 20 10/30/2020   PROT 8.1 10/30/2020   ALBUMIN 4.5 10/30/2020   CALCIUM 9.8 10/30/2020   ANIONGAP 14 10/30/2020   Lab Results  Component Value Date   CHOL 207 (H) 04/11/2020   Lab Results  Component Value Date   HDL 60 04/11/2020   Lab Results  Component Value Date   LDLCALC 130 (H) 04/11/2020   Lab Results  Component Value Date   TRIG 94 04/11/2020   Lab Results  Component Value Date   CHOLHDL 3.5 04/11/2020   Lab Results  Component Value Date   HGBA1C 10.5 (A) 09/25/2020      Assessment & Plan:   Problem List Items Addressed This Visit       Endocrine   Type 2 diabetes mellitus without complication, without long-term current use of insulin (HCC) - Primary   Relevant Orders   Comprehensive  metabolic panel   CBC with Differential/Platelet   Hemoglobin A1c   Other Visit Diagnoses     Hyperkalemia       Relevant Orders   Comprehensive metabolic panel   CBC with Differential/Platelet   Hemoglobin A1c   Chronic hyperglycemia       Relevant Orders   Insulin, Free (Bioactive)-([facility])   TSH   Glucagon       No orders of the defined types were placed in this encounter.   Follow-up: Return as scheduled.   PLAN Labs collected. Will follow up with the patient as warranted. Unclear etiology of hyperkalemia. Seems that sugars are not congruent with lifestyle as well. Concern for underlying contributor to hyperglycemia. Will check insulin and glucagon levels today, refer to Endo. Will likely need long acting daily insulin at least. Consider nutrition and diabetic education referral if no clear etiology. Return as scheduled, can have her return sooner if labs or symptoms indicate. Patient encouraged to call clinic with any questions, comments, or concerns.  I spent 42 minutes with this patient on education of disease process, possible etiologies for abnormal labs, and developing a care plan.  Maximiano Coss, NP

## 2020-11-06 NOTE — Patient Instructions (Addendum)
[name] -   [name] to see you. Sorry you have not been feeling well.   Labs today will check potassium, A1c, blood counts, insulin levels (insulin lowers sugars), glucagon levels (glucagon increases sugars), and TSH (thyroid dysfunction can contribute to many symptoms). I'll be in touch with results. They'll be available on MyChart as well. Username and password below. I'd recommend the App as it's easy to navigate and takes up very little room on your phone.   I will refer to to an Endocrinology team. They will contact you in the coming 1-2 weeks to get an appt set up. Let me know if you have concerns there.  We may have to consider insulin to lower sugars. This will likely start with a daily long acting insulin taken once at night before bed.   Let's plan to recheck labs in 2 weeks if there are abnormal results. Otherwise we can keep our follow up in around 6 weeks from now.  Continue with healthy diet choices and staying active as tolerated.   Thank you  [name]   MyChart: Username: [email or web address] Password: [email or web address]  The password is case sensitive - the S must be uppercase.     If you have lab work done today you will be contacted with your lab results within the next 2 weeks.  If you have not heard from [place] then please contact us. The fastest way to get your results is to register for My Chart.   IF you received an x-ray today, you will receive an invoice from [facility]. Please contact [facility] at [phone] with questions or concerns regarding your invoice.   IF you received labwork today, you will receive an invoice from [place]. Please contact [facility] at [phone] with questions or concerns regarding your invoice.   Our billing staff will not be able to assist you with questions regarding bills from these companies.  You will be contacted with the lab results as soon as they are available. The fastest way to get your results is to activate your My  Chart account. Instructions are located on the last page of this paperwork. If you have not heard from [place] regarding the results in 2 weeks, please contact this office.

## 2020-11-07 ENCOUNTER — Telehealth: Payer: Self-pay

## 2020-11-07 ENCOUNTER — Other Ambulatory Visit: Payer: Self-pay

## 2020-11-07 ENCOUNTER — Other Ambulatory Visit: Payer: Self-pay | Admitting: Registered Nurse

## 2020-11-07 DIAGNOSIS — E1165 Type 2 diabetes mellitus with hyperglycemia: Secondary | ICD-10-CM

## 2020-11-07 MED ORDER — BASAGLAR KWIKPEN 100 UNIT/ML ~~LOC~~ SOPN: 15.0000 [IU] | PEN_INJECTOR | Freq: Every day | SUBCUTANEOUS | 0 refills | Status: DC

## 2020-11-07 NOTE — Telephone Encounter (Signed)
Pt Insulin Glargine (BASAGLAR KWIKPEN) 100 UNIT/ML was sent to [place], [place]  and was supposed to be sent to [id number] - [place], [place] pt has moved all her RX to this pharmacy   Pt call back [phone]

## 2020-11-07 NOTE — Telephone Encounter (Signed)
Medication sent to preferred pharmacy

## 2020-11-08 ENCOUNTER — Encounter: Payer: Self-pay | Admitting: Registered Nurse

## 2020-11-09 ENCOUNTER — Other Ambulatory Visit: Payer: Self-pay

## 2020-11-09 MED ORDER — INSULIN PEN NEEDLE 29G X 12.7MM MISC: 15.0000 [IU] | Freq: Every day | 5 refills | Status: DC

## 2020-11-12 ENCOUNTER — Encounter: Payer: Self-pay | Admitting: Registered Nurse

## 2020-11-15 LAB — GLUCAGON: Glucagon Lvl: 38 pg/mL (ref 11–78)

## 2020-11-15 LAB — INSULIN, FREE (BIOACTIVE): Insulin, Free: 2 u[IU]/mL (ref 1.5–14.9)

## 2020-11-19 ENCOUNTER — Encounter: Payer: Self-pay | Admitting: Endocrinology

## 2020-12-17 ENCOUNTER — Telehealth: Payer: Self-pay

## 2020-12-17 NOTE — Telephone Encounter (Signed)
Pt needs refill on Insulin Glargine [facility] KWIKPEN) 100 UNIT/ML [id number]   [id number] [place], [place] - [address]   Pt had appt [id number] future appt 08/24

## 2020-12-18 ENCOUNTER — Other Ambulatory Visit: Payer: Self-pay | Admitting: Registered Nurse

## 2020-12-18 ENCOUNTER — Other Ambulatory Visit: Payer: Self-pay

## 2020-12-18 DIAGNOSIS — E1165 Type 2 diabetes mellitus with hyperglycemia: Secondary | ICD-10-CM

## 2020-12-18 MED ORDER — BASAGLAR KWIKPEN 100 UNIT/ML ~~LOC~~ SOPN: 15.0000 [IU] | PEN_INJECTOR | Freq: Every day | SUBCUTANEOUS | 0 refills | Status: DC

## 2020-12-18 NOTE — Telephone Encounter (Signed)
Medication has been sent to pharmacy.  °

## 2020-12-18 NOTE — Telephone Encounter (Signed)
Pharmacy comment: Alternative Requested:THE PRESCRIBED MEDICATION IS NOT COVERED BY INSURANCE. PLEASE CONSIDER CHANGING TO ONE OF THE SUGGESTED COVERED ALTERNATIVES.  All Pharmacy Suggested Alternatives:   Insulin Glargine (BASAGLAR KWIKPEN) 100 UNIT/ML insulin detemir (LEVEMIR FLEXTOUCH) 100 UNIT/ML FlexPen insulin degludec (TRESIBA FLEXTOUCH) 100 UNIT/ML FlexTouch Pen insulin detemir (LEVEMIR) 100 UNIT/ML injection Insulin Degludec (TRESIBA) 100 UNIT/ML SOLN  To prescribe one of the alternatives listed above, open the encounter and click Replace.  Open Encounter

## 2020-12-19 NOTE — Telephone Encounter (Signed)
Changed to [place] 20 units daily  Will see pt in one week  Thanks  Rich

## 2020-12-26 ENCOUNTER — Other Ambulatory Visit: Payer: Self-pay

## 2020-12-26 ENCOUNTER — Ambulatory Visit (INDEPENDENT_AMBULATORY_CARE_PROVIDER_SITE_OTHER): Payer: Medicare HMO | Admitting: Registered Nurse

## 2020-12-26 ENCOUNTER — Encounter: Payer: Self-pay | Admitting: Registered Nurse

## 2020-12-26 VITALS — BP 116/71 | HR 79 | Temp 98.1°F | Resp 18 | Ht 59.0 in | Wt 134.2 lb

## 2020-12-26 DIAGNOSIS — E1165 Type 2 diabetes mellitus with hyperglycemia: Secondary | ICD-10-CM | POA: Insufficient documentation

## 2020-12-26 DIAGNOSIS — H6122 Impacted cerumen, left ear: Secondary | ICD-10-CM | POA: Diagnosis not present

## 2020-12-26 LAB — POCT GLYCOSYLATED HEMOGLOBIN (HGB A1C): Hemoglobin A1C: 13.8 % — AB (ref 4.0–5.6)

## 2020-12-26 MED ORDER — INSULIN DEGLUDEC 100 UNIT/ML ~~LOC~~ SOPN: 20.0000 [IU] | PEN_INJECTOR | Freq: Every day | SUBCUTANEOUS | 0 refills | Status: DC

## 2020-12-26 MED ORDER — SITAGLIPTIN PHOSPHATE 100 MG PO TABS: 100.0000 mg | ORAL_TABLET | Freq: Every day | ORAL | 0 refills | Status: DC

## 2020-12-26 NOTE — Patient Instructions (Addendum)
Lindsey Bray -  [name] to see you. Sorry we haven't made much progress.  I'm glad you've got an appt to see Dr. Loanne Drilling  For now, let's increase Tresiba (insulin) by 10% every 5 days until fasting glucose levels are achieved - ideal levels would be between 90-110 or thereabouts. Fasting glucose ideally taken in the mornings after going without eating all night - at least 8 hours.   Also going to increase Januvia to '100mg'$  daily.   Let's plan on following up in 6 mo since you are able to get in to see Dr. Loanne Drilling. I highly recommend him - he's very good at what he does.  Thank you  [name]     If you have lab work done today you will be contacted with your lab results within the next 2 weeks.  If you have not heard from [place] then please contact us. The fastest way to get your results is to register for My Chart.   IF you received an x-ray today, you will receive an invoice from [facility]. Please contact [facility] at [phone] with questions or concerns regarding your invoice.   IF you received labwork today, you will receive an invoice from [place]. Please contact [facility] at [phone] with questions or concerns regarding your invoice.   Our billing staff will not be able to assist you with questions regarding bills from these companies.  You will be contacted with the lab results as soon as they are available. The fastest way to get your results is to activate your My Chart account. Instructions are located on the last page of this paperwork. If you have not heard from [place] regarding the results in 2 weeks, please contact this office.

## 2020-12-26 NOTE — Progress Notes (Signed)
Established Patient Office Visit  Subjective:  Patient ID: Lindsey Bray, female    DOB: October 06, 1950  Age: 70 y.o. MRN: [id number]  CC:  Chief Complaint  Patient presents with   Follow-up    Patient states she is here for a 3 month follow up. Patient also having problems with her left ear.    HPI Lindsey Bray presents for t2dm  t2dm Last A1c: 14.6 on 11/06/20 Currently taking: tresiba 20 units daily, januvia 56m po qd,  No new complications Reports good compliance with medications Diet has been steady, improved from previously Exercise habits have been continues to be focus for her  Left ear Ongoing a few weeks Intermittent muffled hearing No pain or drainage Has had impacted cerumen in the past, feels similar   Past Medical History:  Diagnosis Date   Allergy    as a child and young adult   Arthritis    Oa left shoulder   Asthma    as a child and young adult    Past Surgical History:  Procedure Laterality Date   BREAST SURGERY Right 156(age 70   benign breast tumors   CESAREAN SECTION     x1   COLONOSCOPY  2004   ~16 yrs ago- was normal    trigger thumb  05/2017   r hand    Family History  Problem Relation Age of Onset   Cancer Mother 720      colon- died age 69   Colon cancer Mother    COPD Father    Heart disease Father    Cancer Sister 489      breast   Breast cancer Sister    Cancer Sister 663      breast; had a lumpectomy and is on tamoxifen   Breast cancer Sister    Colon polyps Sister    Esophageal cancer Neg Hx    Rectal cancer Neg Hx    Stomach cancer Neg Hx     Social History   Socioeconomic History   Marital status: Single    Spouse name: Not on file   Number of children: 1   Years of education: Not on file   Highest education level: Not on file  Occupational History   Not on file  Tobacco Use   Smoking status: Never   Smokeless tobacco: Never  Vaping Use   Vaping Use: Never used  Substance and Sexual Activity    Alcohol use: No    Alcohol/week: 0.0 standard drinks   Drug use: Never   Sexual activity: Not on file  Other Topics Concern   Not on file  Social History Narrative   [place] has a 312yo son, who is married. No grandchildren.   Social Determinants of Health   Financial Resource Strain: Not on file  Food Insecurity: Not on file  Transportation Needs: Not on file  Physical Activity: Not on file  Stress: Not on file  Social Connections: Not on file  Intimate Partner Violence: Not on file    Outpatient Medications Prior to Visit  Medication Sig Dispense Refill   albuterol (VENTOLIN HFA) 108 (90 Base) MCG/ACT inhaler Inhale 2 puffs into the lungs every 4 (four) hours as needed for wheezing or shortness of breath (cough, shortness of breath or wheezing.). 1 Inhaler 1   Insulin Pen Needle 29G X 12.7MM MISC Inject 15 Units into the skin daily. 100 each 5  methocarbamol (ROBAXIN) 500 MG tablet Take 1 tablet (500 mg total) by mouth at bedtime as needed for muscle spasms. 60 tablet 0   pantoprazole (PROTONIX) 40 MG tablet Take 1 tablet (40 mg total) by mouth daily. 30 tablet 3   polyethylene glycol powder (GLYCOLAX/MIRALAX) 17 GM/SCOOP powder Take 17 g by mouth 2 (two) times daily as needed. 3350 g 1   SUMAtriptan-naproxen (TREXIMET) 85-500 MG tablet Take 1 tablet by mouth every 2 (two) hours as needed for migraine. Do not exceed two doses in 1 day. 10 tablet 0   insulin degludec (TRESIBA) 100 UNIT/ML FlexTouch Pen Inject 20 Units into the skin daily. 15 mL 0   sitaGLIPtin (JANUVIA) 25 MG tablet Take 1 tablet (25 mg total) by mouth daily. 90 tablet 1   diclofenac sodium (VOLTAREN) 1 % GEL APP 4 GRAMS AA EXT QID (Patient not taking: No sig reported)     ibuprofen (ADVIL) 200 MG tablet Take 200 mg by mouth every 6 (six) hours as needed. (Patient not taking: No sig reported)     No facility-administered medications prior to visit.    No Known Allergies  ROS Review  of Systems  Constitutional: Negative.   HENT: Negative.    Eyes: Negative.   Respiratory: Negative.    Cardiovascular: Negative.   Gastrointestinal: Negative.   Genitourinary: Negative.   Musculoskeletal: Negative.   Skin: Negative.   Neurological: Negative.   Psychiatric/Behavioral: Negative.    All other systems reviewed and are negative.    Objective:    Physical Exam Vitals and nursing note reviewed.  Constitutional:      General: She is not in acute distress.    Appearance: Normal appearance. She is normal weight. She is not ill-appearing, toxic-appearing or diaphoretic.  HENT:     Right Ear: Tympanic membrane, ear canal and external ear normal. There is no impacted cerumen.     Left Ear: There is impacted cerumen.  Cardiovascular:     Rate and Rhythm: Normal rate and regular rhythm.     Heart sounds: Normal heart sounds. No murmur heard.   No friction rub. No gallop.  Pulmonary:     Effort: Pulmonary effort is normal. No respiratory distress.     Breath sounds: Normal breath sounds. No stridor. No wheezing, rhonchi or rales.  Chest:     Chest wall: No tenderness.  Skin:    General: Skin is warm and dry.  Neurological:     General: No focal deficit present.     Mental Status: She is alert and oriented to person, place, and time. Mental status is at baseline.  Psychiatric:        Mood and Affect: Mood normal.        Behavior: Behavior normal.        Thought Content: Thought content normal.        Judgment: Judgment normal.    BP 116/71   Pulse 79   Temp 98.1 F (36.7 C) (Temporal)   Resp 18   Ht 4' 11" (1.499 m)   Wt 134 lb 3.2 oz (60.9 kg)   SpO2 99%   BMI 27.11 kg/m  Wt Readings from Last 3 Encounters:  12/26/20 134 lb 3.2 oz (60.9 kg)  11/06/20 137 lb 6.4 oz (62.3 kg)  09/25/20 145 lb (65.8 kg)     Health Maintenance Due  Topic Date Due   URINE MICROALBUMIN  Never done   INFLUENZA VACCINE  12/03/2020    There are no  preventive care reminders  to display for this patient.  Lab Results  Component Value Date   TSH 2.44 11/06/2020   Lab Results  Component Value Date   WBC 2.9 (L) 11/06/2020   HGB 14.3 11/06/2020   HCT 43.4 11/06/2020   MCV 82.8 11/06/2020   PLT 168.0 11/06/2020   Lab Results  Component Value Date   NA 136 11/06/2020   K 4.3 11/06/2020   CO2 24 11/06/2020   GLUCOSE 322 (H) 11/06/2020   BUN 14 11/06/2020   CREATININE 0.84 11/06/2020   BILITOT 0.8 11/06/2020   ALKPHOS 75 11/06/2020   AST 19 11/06/2020   ALT 16 11/06/2020   PROT 7.9 11/06/2020   ALBUMIN 4.7 11/06/2020   CALCIUM 10.1 11/06/2020   ANIONGAP 14 10/30/2020   GFR 71.04 11/06/2020   Lab Results  Component Value Date   CHOL 207 (H) 04/11/2020   Lab Results  Component Value Date   HDL 60 04/11/2020   Lab Results  Component Value Date   LDLCALC 130 (H) 04/11/2020   Lab Results  Component Value Date   TRIG 94 04/11/2020   Lab Results  Component Value Date   CHOLHDL 3.5 04/11/2020   Lab Results  Component Value Date   HGBA1C 13.8 (A) 12/26/2020      Assessment & Plan:   Problem List Items Addressed This Visit       Endocrine   Uncontrolled type 2 diabetes mellitus with hyperglycemia (HCC) - Primary   Relevant Medications   sitaGLIPtin (JANUVIA) 100 MG tablet   insulin degludec (TRESIBA) 100 UNIT/ML FlexTouch Pen   Other Relevant Orders   POCT glycosylated hemoglobin (Hb A1C) (Completed)   Microalbumin / creatinine urine ratio   Other Visit Diagnoses     Impacted cerumen, left ear           Meds ordered this encounter  Medications   sitaGLIPtin (JANUVIA) 100 MG tablet    Sig: Take 1 tablet (100 mg total) by mouth daily.    Dispense:  90 tablet    Refill:  0    Order Specific Question:   Supervising Provider    Answer:   Carlota Raspberry, JEFFREY R [[id number]]   insulin degludec (TRESIBA) 100 UNIT/ML FlexTouch Pen    Sig: Inject 20 Units into the skin daily. Increase by 10% every 5 days until goal fasting glucose met  (90-110).    Dispense:  15 mL    Refill:  0    Order Specific Question:   Supervising Provider    Answer:   Carlota Raspberry, JEFFREY R [[id number]]     Follow-up: Return in about 3 months (around 03/28/2021) for t2dm - may extend to 6 mo follow up if seen by Dr. Loanne Drilling for t2dm.   PLAN Will titrate tresiba dosing by 10% every 5 days until goal fasting glucose reached. Will increase januvia to 158m po qd. She has appt with Dr. ELoanne Drillingon Sept 30 Return in 6 mo  Ear lavage L side for impacted cerumen successful. Mild irritation remains, should keep this clean and dry. Patient encouraged to call clinic with any questions, comments, or concerns.  RMaximiano Coss NP

## 2020-12-31 DIAGNOSIS — Z01419 Encounter for gynecological examination (general) (routine) without abnormal findings: Secondary | ICD-10-CM | POA: Diagnosis not present

## 2020-12-31 DIAGNOSIS — Z6825 Body mass index (BMI) 25.0-25.9, adult: Secondary | ICD-10-CM | POA: Diagnosis not present

## 2020-12-31 DIAGNOSIS — Z1231 Encounter for screening mammogram for malignant neoplasm of breast: Secondary | ICD-10-CM | POA: Diagnosis not present

## 2020-12-31 DIAGNOSIS — Z124 Encounter for screening for malignant neoplasm of cervix: Secondary | ICD-10-CM | POA: Diagnosis not present

## 2021-01-08 ENCOUNTER — Other Ambulatory Visit: Payer: Self-pay | Admitting: Obstetrics & Gynecology

## 2021-01-08 DIAGNOSIS — R928 Other abnormal and inconclusive findings on diagnostic imaging of breast: Secondary | ICD-10-CM

## 2021-01-14 DIAGNOSIS — E119 Type 2 diabetes mellitus without complications: Secondary | ICD-10-CM | POA: Diagnosis not present

## 2021-01-21 ENCOUNTER — Telehealth: Payer: Self-pay | Admitting: Registered Nurse

## 2021-01-21 NOTE — Telephone Encounter (Signed)
Tried calling patient to schedule Medicare Annual Wellness Visit (AWV) either virtually or in office.  No answer   Last AWV 09/23/18 ; please schedule at anytime with health coach

## 2021-02-01 ENCOUNTER — Encounter: Payer: Self-pay | Admitting: Endocrinology

## 2021-02-01 ENCOUNTER — Ambulatory Visit (INDEPENDENT_AMBULATORY_CARE_PROVIDER_SITE_OTHER): Payer: Medicare HMO | Admitting: Endocrinology

## 2021-02-01 ENCOUNTER — Other Ambulatory Visit: Payer: Self-pay

## 2021-02-01 VITALS — BP 120/64 | HR 89 | Ht 59.0 in | Wt 136.0 lb

## 2021-02-01 DIAGNOSIS — E1165 Type 2 diabetes mellitus with hyperglycemia: Secondary | ICD-10-CM

## 2021-02-01 DIAGNOSIS — E119 Type 2 diabetes mellitus without complications: Secondary | ICD-10-CM

## 2021-02-01 LAB — POCT GLYCOSYLATED HEMOGLOBIN (HGB A1C): Hemoglobin A1C: 8.6 % — AB (ref 4.0–5.6)

## 2021-02-01 MED ORDER — INSULIN DEGLUDEC 100 UNIT/ML ~~LOC~~ SOPN: 15.0000 [IU] | PEN_INJECTOR | Freq: Every day | SUBCUTANEOUS | 0 refills | Status: DC

## 2021-02-01 MED ORDER — RYBELSUS 7 MG PO TABS: 7.0000 mg | ORAL_TABLET | Freq: Every day | ORAL | 3 refills | Status: DC

## 2021-02-01 NOTE — Patient Instructions (Addendum)
good diet and exercise significantly improve the control of your diabetes.  please let me know if you wish to be referred to a dietician.  high blood sugar is very risky to your health.  you should see an eye doctor and dentist every year.  It is very important to get all recommended vaccinations.  Controlling your blood pressure and cholesterol drastically reduces the damage diabetes does to your body.  Those who smoke should quit.  Please discuss these with your doctor.  check your blood sugar twice a day.  vary the time of day when you check, between before the 3 meals, and at bedtime.  also check if you have symptoms of your blood sugar being too high or too low.  please keep a record of the readings and bring it to your next appointment here (or you can bring the meter itself).  You can write it on any piece of paper.  please call us sooner if your blood sugar goes below 70, or if most of your readings are over 200.  We will need to take this complex situation in stages. For now, please: Reduce the Tresiba to 15 units daily, and: I have sent a prescription to your pharmacy, to change the Januvia to [place]. Please come back for a follow-up appointment in 1 month.

## 2021-02-01 NOTE — Progress Notes (Signed)
Subjective:    Patient ID: Lindsey Bray, female    DOB: October 17, 1950, 70 y.o.   MRN: [id number]  HPI pt is referred by Maximiano Coss, NP, for diabetes.  Pt states DM was dx'ed in 2021; she is unaware of any chronic complications; she has been on insulin since mid-2022; pt says her diet and exercise are good; she has never had GDM, pancreatitis, pancreatic surgery, severe hypoglycemia or DKA.  Pt says cbg varies from 80-149.  It is in general higher as the day goes on.  She did not tolerate metformin (not-XR; abd pain).   Past Medical History:  Diagnosis Date   Allergy    as a child and young adult   Arthritis    Oa left shoulder   Asthma    as a child and young adult    Past Surgical History:  Procedure Laterality Date   BREAST SURGERY Right 81 (age 87)   benign breast tumors   CESAREAN SECTION     x1   COLONOSCOPY  2004   ~16 yrs ago- was normal    trigger thumb  05/2017   r hand    Social History   Socioeconomic History   Marital status: Single    Spouse name: Not on file   Number of children: 1   Years of education: Not on file   Highest education level: Not on file  Occupational History   Not on file  Tobacco Use   Smoking status: Never   Smokeless tobacco: Never  Vaping Use   Vaping Use: Never used  Substance and Sexual Activity   Alcohol use: No    Alcohol/week: 0.0 standard drinks   Drug use: Never   Sexual activity: Not on file  Other Topics Concern   Not on file  Social History Narrative   Lives by herself in [place]; has a 72 yo son, who is married. No grandchildren.   Social Determinants of Health   Financial Resource Strain: Not on file  Food Insecurity: Not on file  Transportation Needs: Not on file  Physical Activity: Not on file  Stress: Not on file  Social Connections: Not on file  Intimate Partner Violence: Not on file    Current Outpatient Medications on File Prior to Visit  Medication Sig Dispense Refill   albuterol  (VENTOLIN HFA) 108 (90 Base) MCG/ACT inhaler Inhale 2 puffs into the lungs every 4 (four) hours as needed for wheezing or shortness of breath (cough, shortness of breath or wheezing.). 1 Inhaler 1   diclofenac sodium (VOLTAREN) 1 % GEL APP 4 GRAMS AA EXT QID     ibuprofen (ADVIL) 200 MG tablet Take 200 mg by mouth every 6 (six) hours as needed.     Insulin Pen Needle 29G X 12.7MM MISC Inject 15 Units into the skin daily. 100 each 5   methocarbamol (ROBAXIN) 500 MG tablet Take 1 tablet (500 mg total) by mouth at bedtime as needed for muscle spasms. 60 tablet 0   pantoprazole (PROTONIX) 40 MG tablet Take 1 tablet (40 mg total) by mouth daily. 30 tablet 3   polyethylene glycol powder (GLYCOLAX/MIRALAX) 17 GM/SCOOP powder Take 17 g by mouth 2 (two) times daily as needed. 3350 g 1   SUMAtriptan-naproxen (TREXIMET) 85-500 MG tablet Take 1 tablet by mouth every 2 (two) hours as needed for migraine. Do not exceed two doses in 1 day. 10 tablet 0   No current facility-administered medications on file prior to visit.  No Known Allergies  Family History  Problem Relation Age of Onset   Cancer Mother 3       colon- died age 20    Colon cancer Mother    COPD Father    Heart disease Father    Cancer Sister 28       breast   Breast cancer Sister    Cancer Sister 21       breast; had a lumpectomy and is on tamoxifen   Breast cancer Sister    Colon polyps Sister    Esophageal cancer Neg Hx    Rectal cancer Neg Hx    Stomach cancer Neg Hx    Diabetes Neg Hx     BP 120/64 (BP Location: Right Arm, Patient Position: Sitting, Cuff Size: Normal)   Pulse 89   Ht 4\' 11"  (1.499 m)   Wt 136 lb (61.7 kg)   SpO2 98%   BMI 27.47 kg/m     Review of Systems denies sob, n/v, memory loss, and depression. She has lost 20 lbs x 4 mos.      Objective:   Physical Exam Pulses: dorsalis pedis intact bilat.   MSK: no deformity of the feet.   CV: no leg edema.  Skin:  no ulcer on the feet.  normal color  and temp on the feet.   Neuro: sensation is intact to touch on the feet.    Lab Results  Component Value Date   HGBA1C 8.6 (A) 02/01/2021   Lab Results  Component Value Date   CREATININE 0.84 11/06/2020   BUN 14 11/06/2020   NA 136 11/06/2020   K 4.3 11/06/2020   CL 97 11/06/2020   CO2 24 11/06/2020   Lab Results  Component Value Date   TSH 2.44 11/06/2020   I have reviewed outside records, and summarized: Pt was noted to have elevated A1c, and referred here.  Insulin titration was advised.  Cerumen impaction was also advised.     Assessment & Plan:  Insulin-requiring type 2 DM: uncontrolled.  We discussed.  She may be manageable off insulin.    Patient Instructions  good diet and exercise significantly improve the control of your diabetes.  please let me know if you wish to be referred to a dietician.  high blood sugar is very risky to your health.  you should see an eye doctor and dentist every year.  It is very important to get all recommended vaccinations.  Controlling your blood pressure and cholesterol drastically reduces the damage diabetes does to your body.  Those who smoke should quit.  Please discuss these with your doctor.  check your blood sugar twice a day.  vary the time of day when you check, between before the 3 meals, and at bedtime.  also check if you have symptoms of your blood sugar being too high or too low.  please keep a record of the readings and bring it to your next appointment here (or you can bring the meter itself).  You can write it on any piece of paper.  please call us sooner if your blood sugar goes below 70, or if most of your readings are over 200.  We will need to take this complex situation in stages. For now, please: Reduce the Tresiba to 15 units daily, and: I have sent a prescription to your pharmacy, to change the Januvia to Rybelsus. Please come back for a follow-up appointment in 1 month.

## 2021-02-15 ENCOUNTER — Ambulatory Visit: Payer: Medicare HMO

## 2021-02-15 ENCOUNTER — Other Ambulatory Visit: Payer: Self-pay

## 2021-02-15 ENCOUNTER — Ambulatory Visit
Admission: RE | Admit: 2021-02-15 | Discharge: 2021-02-15 | Disposition: A | Discharge status: Home / Self Care | Payer: Medicare HMO | Source: Ambulatory Visit | Attending: Obstetrics & Gynecology | Admitting: Obstetrics & Gynecology

## 2021-02-15 DIAGNOSIS — R928 Other abnormal and inconclusive findings on diagnostic imaging of breast: Secondary | ICD-10-CM

## 2021-03-05 ENCOUNTER — Ambulatory Visit (INDEPENDENT_AMBULATORY_CARE_PROVIDER_SITE_OTHER): Payer: Medicare HMO | Admitting: Endocrinology

## 2021-03-05 ENCOUNTER — Other Ambulatory Visit: Payer: Self-pay

## 2021-03-05 VITALS — BP 138/72 | HR 91 | Ht 59.0 in | Wt 131.8 lb

## 2021-03-05 DIAGNOSIS — E119 Type 2 diabetes mellitus without complications: Secondary | ICD-10-CM | POA: Diagnosis not present

## 2021-03-05 MED ORDER — RYBELSUS 14 MG PO TABS: 14.0000 mg | ORAL_TABLET | Freq: Every day | ORAL | 3 refills | Status: DC

## 2021-03-05 NOTE — Patient Instructions (Addendum)
check your blood sugar twice a day.  vary the time of day when you check, between before the 3 meals, and at bedtime.  also check if you have symptoms of your blood sugar being too high or too low.  please keep a record of the readings and bring it to your next appointment here (or you can bring the meter itself).  You can write it on any piece of paper.  please call us sooner if your blood sugar goes below 70, or if most of your readings are over 200.  Please stop taking the [place], and:  I have sent a prescription to your pharmacy, to double the Rybelsus.   Please come back for a follow-up appointment in 1 month.

## 2021-03-05 NOTE — Progress Notes (Signed)
Subjective:    Patient ID: Lindsey Bray, female    DOB: 31-Mar-1951, 70 y.o.   MRN: [id number]  HPI Pt returns for f/u of diabetes mellitus: DM type: Insulin-requiring type 2.   Dx'ed: 1937 Complications: none Therapy: insulin since mid-20202 GDM: never DKA: never Severe hypoglycemia: never Pancreatitis: never Pancreatic imaging: normal on 2022 CT SDOH: none Other: She did not tolerate metformin (not-XR; abd pain); She may be manageable off insulin. Interval history:  no cbg record, but states cbg's are usually below 100.   Past Medical History:  Diagnosis Date   Allergy    as a child and young adult   Arthritis    Oa left shoulder   Asthma    as a child and young adult    Past Surgical History:  Procedure Laterality Date   BREAST SURGERY Right 62 (age 56)   benign breast tumors   CESAREAN SECTION     x1   COLONOSCOPY  2004   ~16 yrs ago- was normal    trigger thumb  05/2017   r hand    Social History   Socioeconomic History   Marital status: Single    Spouse name: Not on file   Number of children: 1   Years of education: Not on file   Highest education level: Not on file  Occupational History   Not on file  Tobacco Use   Smoking status: Never   Smokeless tobacco: Never  Vaping Use   Vaping Use: Never used  Substance and Sexual Activity   Alcohol use: No    Alcohol/week: 0.0 standard drinks   Drug use: Never   Sexual activity: Not on file  Other Topics Concern   Not on file  Social History Narrative   Lives by herself in [place]; has a 102 yo son, who is married. No grandchildren.   Social Determinants of Health   Financial Resource Strain: Not on file  Food Insecurity: Not on file  Transportation Needs: Not on file  Physical Activity: Not on file  Stress: Not on file  Social Connections: Not on file  Intimate Partner Violence: Not on file    Current Outpatient Medications on File Prior to Visit  Medication Sig Dispense Refill    albuterol (VENTOLIN HFA) 108 (90 Base) MCG/ACT inhaler Inhale 2 puffs into the lungs every 4 (four) hours as needed for wheezing or shortness of breath (cough, shortness of breath or wheezing.). 1 Inhaler 1   diclofenac sodium (VOLTAREN) 1 % GEL APP 4 GRAMS AA EXT QID     ibuprofen (ADVIL) 200 MG tablet Take 200 mg by mouth every 6 (six) hours as needed.     Insulin Pen Needle 29G X 12.7MM MISC Inject 15 Units into the skin daily. 100 each 5   methocarbamol (ROBAXIN) 500 MG tablet Take 1 tablet (500 mg total) by mouth at bedtime as needed for muscle spasms. 60 tablet 0   pantoprazole (PROTONIX) 40 MG tablet Take 1 tablet (40 mg total) by mouth daily. 30 tablet 3   polyethylene glycol powder (GLYCOLAX/MIRALAX) 17 GM/SCOOP powder Take 17 g by mouth 2 (two) times daily as needed. 3350 g 1   SUMAtriptan-naproxen (TREXIMET) 85-500 MG tablet Take 1 tablet by mouth every 2 (two) hours as needed for migraine. Do not exceed two doses in 1 day. 10 tablet 0   No current facility-administered medications on file prior to visit.    No Known Allergies  Family History  Problem Relation  Age of Onset   Cancer Mother 26       colon- died age 41    Colon cancer Mother    COPD Father    Heart disease Father    Cancer Sister 40       breast   Breast cancer Sister    Cancer Sister 11       breast; had a lumpectomy and is on tamoxifen   Breast cancer Sister    Colon polyps Sister    Esophageal cancer Neg Hx    Rectal cancer Neg Hx    Stomach cancer Neg Hx    Diabetes Neg Hx     BP 138/72 (BP Location: Right Arm, Patient Position: Sitting, Cuff Size: Normal)   Pulse 91   Ht 4\' 11"  (1.499 m)   Wt 131 lb 12.8 oz (59.8 kg)   SpO2 97%   BMI 26.62 kg/m    Review of Systems Denies N/V/HB    Objective:   Physical Exam   Lab Results  Component Value Date   HGBA1C 8.6 (A) 02/01/2021      Assessment & Plan:  Type 2 DM: uncontrolled.    Patient Instructions  check your blood sugar twice a  day.  vary the time of day when you check, between before the 3 meals, and at bedtime.  also check if you have symptoms of your blood sugar being too high or too low.  please keep a record of the readings and bring it to your next appointment here (or you can bring the meter itself).  You can write it on any piece of paper.  please call us sooner if your blood sugar goes below 70, or if most of your readings are over 200.  Please stop taking the [place], and:  I have sent a prescription to your pharmacy, to double the Rybelsus.   Please come back for a follow-up appointment in 1 month.

## 2021-03-09 ENCOUNTER — Encounter: Payer: Self-pay | Admitting: Endocrinology

## 2021-03-11 ENCOUNTER — Other Ambulatory Visit: Payer: Self-pay | Admitting: Endocrinology

## 2021-03-11 ENCOUNTER — Other Ambulatory Visit: Payer: Self-pay

## 2021-03-11 MED ORDER — RYBELSUS 7 MG PO TABS: 14.0000 mg | ORAL_TABLET | Freq: Every day | ORAL | 11 refills | Status: DC

## 2021-03-15 ENCOUNTER — Telehealth: Payer: Self-pay | Admitting: Pharmacy Technician

## 2021-03-15 ENCOUNTER — Other Ambulatory Visit (HOSPITAL_COMMUNITY): Payer: Self-pay

## 2021-03-15 NOTE — Telephone Encounter (Signed)
Patient Advocate Encounter   Received notification from [facility] that prior authorization for Rybelsus 7mg  2 per day is required by his/her insurance [employer].  Per Test Claim: Duplicate therapy. Pt got med on 03/08/21. Max dose of 1 per day.   14mg  is commercially available.      Armanda Magic, CPhT Patient [facility] Phone: [phone] Fax:  [phone]

## 2021-03-18 NOTE — Telephone Encounter (Signed)
Message sent thru MyChart regarding the change

## 2021-04-04 DIAGNOSIS — Z9189 Other specified personal risk factors, not elsewhere classified: Secondary | ICD-10-CM

## 2021-04-04 NOTE — Progress Notes (Signed)
[facility])                                            [place] Team                                        Statin Quality Measure Assessment    04/04/2021  BERNADEAN SALING 13-Mar-1951 [id number]  Per review of chart and payor information, this patient has been flagged for non-adherence to the following CMS Quality Measure:   [x]  Statin Use in Persons with Diabetes  []  Statin Use in Persons with Cardiovascular Disease  The 10-year ASCVD risk score (Arnett DK, et al., 2019) is: 28.3%   Values used to calculate the score:     Age: 70 years     Sex: Female     Is Non-Hispanic African American: Yes     Diabetic: Yes     Tobacco smoker: No     Systolic Blood Pressure: 416 mmHg     Is BP treated: No     HDL Cholesterol: 60 mg/dL     Total Cholesterol: 207 mg/dL     LDL 130 mg/dL (as of 04/11/2020)  Patient does not have a statin on file or prior documentation of statin intolerance. She saw Dr. Loanne Drilling on 03/05/21 but has not had lipids drawn since 2021. Next appointment w/Dr. Loanne Drilling is on 04/08/2021. She does not have an appointment with her PCP at this time.   If deemed therapeutically appropriate, please consider an updated lipid panel and assessment for statin therapy. However, if patient does report a statin intolerance, please consider associating an exclusion code (see options below) at the next scheduled appointment on 04/08/2021.   Please consider ONE of the following recommendations:   Initiate high intensity statin Atorvastatin 40mg  once daily, #90, 3 refills   Rosuvastatin 20mg  once daily, #90, 3 refills    Initiate moderate intensity          statin with reduced frequency if prior          statin intolerance 1x weekly, #13, 3 refills   2x weekly, #26, 3 refills   3x weekly, #39, 3 refills    Code for past statin intolerance or other exclusions (required annually)  Drug Induced Myopathy G72.0   Myositis,  unspecified M60.9   Rhabdomyolysis M62.82   Prediabetes R73.03   Adverse effect of antihyperlipidemic and antiarteriosclerotic drugs, initial encounter L84.5X6I    Thank you for your time,  Kristeen Miss, [place] Cell: [phone]

## 2021-04-08 ENCOUNTER — Other Ambulatory Visit: Payer: Self-pay

## 2021-04-08 ENCOUNTER — Ambulatory Visit (INDEPENDENT_AMBULATORY_CARE_PROVIDER_SITE_OTHER): Payer: Medicare HMO | Admitting: Endocrinology

## 2021-04-08 VITALS — BP 116/74 | HR 74 | Ht 59.0 in | Wt 133.4 lb

## 2021-04-08 DIAGNOSIS — E119 Type 2 diabetes mellitus without complications: Secondary | ICD-10-CM

## 2021-04-08 LAB — POCT GLYCOSYLATED HEMOGLOBIN (HGB A1C): Hemoglobin A1C: 5.6 % (ref 4.0–5.6)

## 2021-04-08 MED ORDER — TRESIBA FLEXTOUCH 100 UNIT/ML ~~LOC~~ SOPN: 10.0000 [IU] | PEN_INJECTOR | Freq: Every day | SUBCUTANEOUS | 3 refills | Status: DC

## 2021-04-08 NOTE — Progress Notes (Signed)
Subjective:    Patient ID: Lindsey Bray, female    DOB: 1950-06-29, 70 y.o.   MRN: [id number]  HPI Pt returns for f/u of diabetes mellitus: DM type: 2 Dx'ed: 2505 Complications: none Therapy: insulin since mid-2020 GDM: never DKA: never Severe hypoglycemia: never Pancreatitis: never Pancreatic imaging: normal on 2022 CT SDOH: none Other: She did not tolerate metformin (not-XR; abd pain); She may be manageable off insulin.   Interval history: She stopped Rybelsus, due to headache.  It then resolved.  She resumed Tresiba, 15 units QD.  She says cbg varies from 85-92.   Past Medical History:  Diagnosis Date   Allergy    as a child and young adult   Arthritis    Oa left shoulder   Asthma    as a child and young adult    Past Surgical History:  Procedure Laterality Date   BREAST SURGERY Right 14 (age 4)   benign breast tumors   CESAREAN SECTION     x1   COLONOSCOPY  2004   ~16 yrs ago- was normal    trigger thumb  05/2017   r hand    Social History   Socioeconomic History   Marital status: Single    Spouse name: Not on file   Number of children: 1   Years of education: Not on file   Highest education level: Not on file  Occupational History   Not on file  Tobacco Use   Smoking status: Never   Smokeless tobacco: Never  Vaping Use   Vaping Use: Never used  Substance and Sexual Activity   Alcohol use: No    Alcohol/week: 0.0 standard drinks   Drug use: Never   Sexual activity: Not on file  Other Topics Concern   Not on file  Social History Narrative   Lives by herself in [place]; has a 41 yo son, who is married. No grandchildren.   Social Determinants of Health   Financial Resource Strain: Not on file  Food Insecurity: Not on file  Transportation Needs: Not on file  Physical Activity: Not on file  Stress: Not on file  Social Connections: Not on file  Intimate Partner Violence: Not on file    Current Outpatient Medications on File Prior to  Visit  Medication Sig Dispense Refill   albuterol (VENTOLIN HFA) 108 (90 Base) MCG/ACT inhaler Inhale 2 puffs into the lungs every 4 (four) hours as needed for wheezing or shortness of breath (cough, shortness of breath or wheezing.). 1 Inhaler 1   diclofenac sodium (VOLTAREN) 1 % GEL APP 4 GRAMS AA EXT QID     ibuprofen (ADVIL) 200 MG tablet Take 200 mg by mouth every 6 (six) hours as needed.     Insulin Pen Needle 29G X 12.7MM MISC Inject 15 Units into the skin daily. 100 each 5   methocarbamol (ROBAXIN) 500 MG tablet Take 1 tablet (500 mg total) by mouth at bedtime as needed for muscle spasms. 60 tablet 0   pantoprazole (PROTONIX) 40 MG tablet Take 1 tablet (40 mg total) by mouth daily. 30 tablet 3   polyethylene glycol powder (GLYCOLAX/MIRALAX) 17 GM/SCOOP powder Take 17 g by mouth 2 (two) times daily as needed. 3350 g 1   SUMAtriptan-naproxen (TREXIMET) 85-500 MG tablet Take 1 tablet by mouth every 2 (two) hours as needed for migraine. Do not exceed two doses in 1 day. 10 tablet 0   No current facility-administered medications on file prior to visit.  No Known Allergies  Family History  Problem Relation Age of Onset   Cancer Mother 99       colon- died age 3    Colon cancer Mother    COPD Father    Heart disease Father    Cancer Sister 56       breast   Breast cancer Sister    Cancer Sister 55       breast; had a lumpectomy and is on tamoxifen   Breast cancer Sister    Colon polyps Sister    Esophageal cancer Neg Hx    Rectal cancer Neg Hx    Stomach cancer Neg Hx    Diabetes Neg Hx     BP 116/74   Pulse 74   Ht 4\' 11"  (1.499 m)   Wt 133 lb 6.4 oz (60.5 kg)   SpO2 99%   BMI 26.94 kg/m    Review of Systems     Objective:   Physical Exam   A1c=5.6%    Assessment & Plan:  Type 2 DM: overcontrolled.  We discussed.  Rather than changing to Januvia, she wants to reduce [place].   HA, due to Rybelsus.  Patient Instructions  check your blood sugar twice a  day.  vary the time of day when you check, between before the 3 meals, and at bedtime.  also check if you have symptoms of your blood sugar being too high or too low.  please keep a record of the readings and bring it to your next appointment here (or you can bring the meter itself).  You can write it on any piece of paper.  please call us sooner if your blood sugar goes below 70, or if most of your readings are over 200.  Please reduce the Tresiba to 10 units daily  Please come back for a follow-up appointment in 6 weeks.

## 2021-04-08 NOTE — Patient Instructions (Addendum)
check your blood sugar twice a day.  vary the time of day when you check, between before the 3 meals, and at bedtime.  also check if you have symptoms of your blood sugar being too high or too low.  please keep a record of the readings and bring it to your next appointment here (or you can bring the meter itself).  You can write it on any piece of paper.  please call us sooner if your blood sugar goes below 70, or if most of your readings are over 200.  Please reduce the Tresiba to 10 units daily  Please come back for a follow-up appointment in 6 weeks.

## 2021-04-10 ENCOUNTER — Other Ambulatory Visit: Payer: Self-pay

## 2021-04-10 ENCOUNTER — Inpatient Hospital Stay (HOSPITAL_COMMUNITY): Payer: Medicare HMO

## 2021-04-10 ENCOUNTER — Emergency Department (HOSPITAL_COMMUNITY): Payer: Medicare HMO

## 2021-04-10 ENCOUNTER — Encounter (HOSPITAL_COMMUNITY): Payer: Self-pay

## 2021-04-10 ENCOUNTER — Inpatient Hospital Stay (HOSPITAL_COMMUNITY)
Admission: EM | Admit: 2021-04-10 | Discharge: 2021-04-15 | DRG: 390 | Disposition: A | Discharge status: Home / Self Care | Payer: Medicare HMO | Attending: General Surgery | Admitting: General Surgery

## 2021-04-10 DIAGNOSIS — Z79899 Other long term (current) drug therapy: Secondary | ICD-10-CM

## 2021-04-10 DIAGNOSIS — M19012 Primary osteoarthritis, left shoulder: Secondary | ICD-10-CM | POA: Diagnosis not present

## 2021-04-10 DIAGNOSIS — K219 Gastro-esophageal reflux disease without esophagitis: Secondary | ICD-10-CM | POA: Diagnosis present

## 2021-04-10 DIAGNOSIS — Z4682 Encounter for fitting and adjustment of non-vascular catheter: Secondary | ICD-10-CM | POA: Diagnosis not present

## 2021-04-10 DIAGNOSIS — J4520 Mild intermittent asthma, uncomplicated: Secondary | ICD-10-CM | POA: Diagnosis present

## 2021-04-10 DIAGNOSIS — Z743 Need for continuous supervision: Secondary | ICD-10-CM | POA: Diagnosis not present

## 2021-04-10 DIAGNOSIS — K56609 Unspecified intestinal obstruction, unspecified as to partial versus complete obstruction: Secondary | ICD-10-CM | POA: Diagnosis present

## 2021-04-10 DIAGNOSIS — Z803 Family history of malignant neoplasm of breast: Secondary | ICD-10-CM

## 2021-04-10 DIAGNOSIS — I959 Hypotension, unspecified: Secondary | ICD-10-CM | POA: Diagnosis not present

## 2021-04-10 DIAGNOSIS — K565 Intestinal adhesions [bands], unspecified as to partial versus complete obstruction: Principal | ICD-10-CM | POA: Diagnosis present

## 2021-04-10 DIAGNOSIS — Z794 Long term (current) use of insulin: Secondary | ICD-10-CM | POA: Diagnosis not present

## 2021-04-10 DIAGNOSIS — Z800 Family history of malignant neoplasm of digestive organs: Secondary | ICD-10-CM | POA: Diagnosis not present

## 2021-04-10 DIAGNOSIS — K5669 Other partial intestinal obstruction: Secondary | ICD-10-CM | POA: Diagnosis not present

## 2021-04-10 DIAGNOSIS — I10 Essential (primary) hypertension: Secondary | ICD-10-CM | POA: Diagnosis not present

## 2021-04-10 DIAGNOSIS — Z825 Family history of asthma and other chronic lower respiratory diseases: Secondary | ICD-10-CM | POA: Diagnosis not present

## 2021-04-10 DIAGNOSIS — R109 Unspecified abdominal pain: Secondary | ICD-10-CM | POA: Diagnosis not present

## 2021-04-10 DIAGNOSIS — E876 Hypokalemia: Secondary | ICD-10-CM

## 2021-04-10 DIAGNOSIS — Z8249 Family history of ischemic heart disease and other diseases of the circulatory system: Secondary | ICD-10-CM

## 2021-04-10 DIAGNOSIS — R1110 Vomiting, unspecified: Secondary | ICD-10-CM | POA: Diagnosis not present

## 2021-04-10 DIAGNOSIS — Z20822 Contact with and (suspected) exposure to covid-19: Secondary | ICD-10-CM | POA: Diagnosis present

## 2021-04-10 DIAGNOSIS — E119 Type 2 diabetes mellitus without complications: Secondary | ICD-10-CM | POA: Diagnosis present

## 2021-04-10 DIAGNOSIS — R1084 Generalized abdominal pain: Secondary | ICD-10-CM | POA: Diagnosis not present

## 2021-04-10 DIAGNOSIS — R0902 Hypoxemia: Secondary | ICD-10-CM | POA: Diagnosis not present

## 2021-04-10 LAB — URINALYSIS, ROUTINE W REFLEX MICROSCOPIC
Bacteria, UA: NONE SEEN
Bilirubin Urine: NEGATIVE
Glucose, UA: >=500 mg/dL — AB
Hgb urine dipstick: NEGATIVE
Ketones, ur: 20 mg/dL — AB
Leukocytes,Ua: NEGATIVE
Nitrite: NEGATIVE
Protein, ur: NEGATIVE mg/dL
Specific Gravity, Urine: 1.024 (ref 1.005–1.030)
pH: 6 (ref 5.0–8.0)

## 2021-04-10 LAB — CBC WITH DIFFERENTIAL/PLATELET
Abs Immature Granulocytes: 0.02 10*3/uL (ref 0.00–0.07)
Basophils Absolute: 0 10*3/uL (ref 0.0–0.1)
Basophils Relative: 1 %
Eosinophils Absolute: 0 10*3/uL (ref 0.0–0.5)
Eosinophils Relative: 1 %
HCT: 42.8 % (ref 36.0–46.0)
Hemoglobin: 13.9 g/dL (ref 12.0–15.0)
Immature Granulocytes: 0 %
Lymphocytes Relative: 17 %
Lymphs Abs: 1 10*3/uL (ref 0.7–4.0)
MCH: 26.7 pg (ref 26.0–34.0)
MCHC: 32.5 g/dL (ref 30.0–36.0)
MCV: 82.1 fL (ref 80.0–100.0)
Monocytes Absolute: 0.2 10*3/uL (ref 0.1–1.0)
Monocytes Relative: 4 %
Neutro Abs: 4.6 10*3/uL (ref 1.7–7.7)
Neutrophils Relative %: 77 %
Platelets: 239 10*3/uL (ref 150–400)
RBC: 5.21 MIL/uL — ABNORMAL HIGH (ref 3.87–5.11)
RDW: 13.2 % (ref 11.5–15.5)
WBC: 5.9 10*3/uL (ref 4.0–10.5)
nRBC: 0 % (ref 0.0–0.2)

## 2021-04-10 LAB — COMPREHENSIVE METABOLIC PANEL
ALT: 27 U/L (ref 0–44)
AST: 34 U/L (ref 15–41)
Albumin: 4.6 g/dL (ref 3.5–5.0)
Alkaline Phosphatase: 68 U/L (ref 38–126)
Anion gap: 14 (ref 5–15)
BUN: 17 mg/dL (ref 8–23)
CO2: 17 mmol/L — ABNORMAL LOW (ref 22–32)
Calcium: 9.3 mg/dL (ref 8.9–10.3)
Chloride: 108 mmol/L (ref 98–111)
Creatinine, Ser: 0.71 mg/dL (ref 0.44–1.00)
GFR, Estimated: >60 mL/min (ref >60)
Glucose, Bld: 206 mg/dL — ABNORMAL HIGH (ref 70–99)
Potassium: 2.7 mmol/L — CL (ref 3.5–5.1)
Sodium: 139 mmol/L (ref 135–145)
Total Bilirubin: 0.7 mg/dL (ref 0.3–1.2)
Total Protein: 8.5 g/dL — ABNORMAL HIGH (ref 6.5–8.1)

## 2021-04-10 LAB — RESP PANEL BY RT-PCR (FLU A&B, COVID) ARPGX2
Influenza A by PCR: NEGATIVE
Influenza B by PCR: NEGATIVE
SARS Coronavirus 2 by RT PCR: NEGATIVE

## 2021-04-10 LAB — BLOOD GAS, VENOUS
Acid-base deficit: 8.8 mmol/L — ABNORMAL HIGH (ref 0.0–2.0)
Bicarbonate: 16.8 mmol/L — ABNORMAL LOW (ref 20.0–28.0)
O2 Saturation: 89.4 %
Patient temperature: 98.6
pCO2, Ven: 36.7 mmHg — ABNORMAL LOW (ref 44.0–60.0)
pH, Ven: 7.282 (ref 7.250–7.430)
pO2, Ven: 67.2 mmHg — ABNORMAL HIGH (ref 32.0–45.0)

## 2021-04-10 LAB — LIPASE, BLOOD: Lipase: 25 U/L (ref 11–51)

## 2021-04-10 LAB — MAGNESIUM: Magnesium: 1.7 mg/dL (ref 1.7–2.4)

## 2021-04-10 LAB — HIV ANTIBODY (ROUTINE TESTING W REFLEX): HIV Screen 4th Generation wRfx: NONREACTIVE

## 2021-04-10 LAB — LACTIC ACID, PLASMA: Lactic Acid, Venous: 2 mmol/L (ref 0.5–1.9)

## 2021-04-10 LAB — CBG MONITORING, ED
Glucose-Capillary: 193 mg/dL — ABNORMAL HIGH (ref 70–99)
Glucose-Capillary: 65 mg/dL — ABNORMAL LOW (ref 70–99)
Glucose-Capillary: 120 mg/dL — ABNORMAL HIGH (ref 70–99)
Glucose-Capillary: 114 mg/dL — ABNORMAL HIGH (ref 70–99)

## 2021-04-10 LAB — GLUCOSE, CAPILLARY: Glucose-Capillary: 100 mg/dL — ABNORMAL HIGH (ref 70–99)

## 2021-04-10 LAB — ETHANOL: Alcohol, Ethyl (B): <10 mg/dL (ref <10)

## 2021-04-10 MED ORDER — INSULIN ASPART 100 UNIT/ML IJ SOLN
0.0000 [IU] | Freq: Every day | INTRAMUSCULAR | Status: DC
  Filled 2021-04-10: qty 0.05

## 2021-04-10 MED ORDER — PANTOPRAZOLE SODIUM 40 MG IV SOLR
40.0000 mg | INTRAVENOUS | Status: DC
  Administered 2021-04-10 – 2021-04-15 (×6): 40 mg via INTRAVENOUS
  Filled 2021-04-10 (×6): qty 40

## 2021-04-10 MED ORDER — DIPHENHYDRAMINE HCL 12.5 MG/5ML PO ELIX: 12.5000 mg | ORAL_SOLUTION | Freq: Four times a day (QID) | ORAL | Status: DC | PRN

## 2021-04-10 MED ORDER — POTASSIUM CHLORIDE CRYS ER 20 MEQ PO TBCR
40.0000 meq | EXTENDED_RELEASE_TABLET | Freq: Once | ORAL | Status: DC
  Filled 2021-04-10: qty 2

## 2021-04-10 MED ORDER — ONDANSETRON 4 MG PO TBDP: 4.0000 mg | ORAL_TABLET | Freq: Four times a day (QID) | ORAL | Status: DC | PRN

## 2021-04-10 MED ORDER — ONDANSETRON HCL 4 MG/2ML IJ SOLN
4.0000 mg | Freq: Once | INTRAMUSCULAR | Status: AC
  Administered 2021-04-10: 4 mg via INTRAVENOUS
  Filled 2021-04-10: qty 2

## 2021-04-10 MED ORDER — POTASSIUM CHLORIDE 10 MEQ/100ML IV SOLN
10.0000 meq | INTRAVENOUS | Status: AC
  Administered 2021-04-10 (×2): 10 meq via INTRAVENOUS
  Filled 2021-04-10 (×3): qty 100

## 2021-04-10 MED ORDER — FENTANYL CITRATE PF 50 MCG/ML IJ SOSY
50.0000 ug | PREFILLED_SYRINGE | Freq: Once | INTRAMUSCULAR | Status: AC
  Administered 2021-04-10: 50 ug via INTRAVENOUS
  Filled 2021-04-10: qty 1

## 2021-04-10 MED ORDER — IOHEXOL 350 MG/ML SOLN
80.0000 mL | Freq: Once | INTRAVENOUS | Status: AC | PRN
  Administered 2021-04-10: 80 mL via INTRAVENOUS

## 2021-04-10 MED ORDER — LIP MEDEX EX OINT
TOPICAL_OINTMENT | CUTANEOUS | Status: AC
  Administered 2021-04-10: 1
  Filled 2021-04-10: qty 7

## 2021-04-10 MED ORDER — SODIUM CHLORIDE 0.9 % IV SOLN
INTRAVENOUS | Status: DC
Start: 2021-04-10 — End: 2021-04-10

## 2021-04-10 MED ORDER — HYDRALAZINE HCL 20 MG/ML IJ SOLN: 10.0000 mg | INTRAMUSCULAR | Status: DC | PRN

## 2021-04-10 MED ORDER — DIPHENHYDRAMINE HCL 50 MG/ML IJ SOLN: 12.5000 mg | Freq: Four times a day (QID) | INTRAMUSCULAR | Status: DC | PRN

## 2021-04-10 MED ORDER — ONDANSETRON HCL 4 MG/2ML IJ SOLN: 4.0000 mg | Freq: Four times a day (QID) | INTRAMUSCULAR | Status: DC | PRN

## 2021-04-10 MED ORDER — INSULIN ASPART 100 UNIT/ML IJ SOLN
0.0000 [IU] | Freq: Three times a day (TID) | INTRAMUSCULAR | Status: DC
  Filled 2021-04-10: qty 0.15

## 2021-04-10 MED ORDER — MORPHINE SULFATE (PF) 2 MG/ML IV SOLN
2.0000 mg | INTRAVENOUS | Status: DC | PRN
  Administered 2021-04-10 – 2021-04-12 (×4): 2 mg via INTRAVENOUS
  Filled 2021-04-10 (×5): qty 1

## 2021-04-10 MED ORDER — KCL IN DEXTROSE-NACL 20-5-0.9 MEQ/L-%-% IV SOLN
INTRAVENOUS | Status: DC
  Filled 2021-04-10 (×12): qty 1000

## 2021-04-10 MED ORDER — MAGNESIUM SULFATE 50 % IJ SOLN
3.0000 g | Freq: Once | INTRAVENOUS | Status: AC
  Administered 2021-04-10: 3 g via INTRAVENOUS
  Filled 2021-04-10: qty 6

## 2021-04-10 MED ORDER — SODIUM CHLORIDE 0.9 % IV BOLUS
1000.0000 mL | Freq: Once | INTRAVENOUS | Status: AC
  Administered 2021-04-10: 1000 mL via INTRAVENOUS

## 2021-04-10 MED ORDER — GLUCOSE 40 % PO GEL
1.0000 | ORAL | Status: DC | PRN
  Administered 2021-04-10: 31 g via ORAL
  Filled 2021-04-10 (×2): qty 1

## 2021-04-10 MED ORDER — POTASSIUM CHLORIDE 10 MEQ/100ML IV SOLN
10.0000 meq | INTRAVENOUS | Status: AC
  Administered 2021-04-10 (×2): 10 meq via INTRAVENOUS
  Filled 2021-04-10: qty 100

## 2021-04-10 MED ORDER — DIATRIZOATE MEGLUMINE & SODIUM 66-10 % PO SOLN
90.0000 mL | Freq: Once | ORAL | Status: AC
  Administered 2021-04-10: 90 mL via NASOGASTRIC
  Filled 2021-04-10: qty 90

## 2021-04-10 NOTE — Progress Notes (Signed)
Inpatient Diabetes Program Recommendations  AACE/ADA: New Consensus Statement on Inpatient Glycemic Control (2015)  Target Ranges:  Prepandial:   less than 140 mg/dL      Peak postprandial:   less than 180 mg/dL (1-2 hours)      Critically ill patients:  140 - 180 mg/dL   Lab Results  Component Value Date   GLUCAP 193 (H) 04/10/2021   HGBA1C 5.6 04/08/2021    Review of Glycemic Control  Diabetes history: DM 2 Outpatient Diabetes medications: Tresiba 10 units Daily Current orders for Inpatient glycemic control:  Novolog 0-15 units tid + hs (starting 12pm today)  Glucose 193 this am Consult DM Management A1c 5.6% on 12/5  Agree with current regimen at this time. Will follow glucose trends.  Thanks,  Tama Headings RN, MSN, BC-ADM Inpatient Diabetes Coordinator Team Pager [phone] (8a-5p)

## 2021-04-10 NOTE — ED Provider Notes (Signed)
[facility] DEPT Provider Note   CSN: [id number] Arrival date & time: 04/10/21  0306     History Chief Complaint  Patient presents with   Nausea   Emesis   Abdominal Pain    Lindsey Bray is a 70 y.o. female.  The history is provided by the patient.  Emesis Associated symptoms: abdominal pain   Abdominal Pain Associated symptoms: vomiting   Lindsey Bray is a 70 y.o. female who presents to the Emergency Department complaining of abdominal pain.  She presents to the emergency department for evaluation of acute onset left-sided abdominal pain that started few hours prior to ED arrival.  She presents via EMS.  She reports numerous episodes of emesis that is brown in color.  No hematemesis.  She had 2 episodes of diarrhea that was nonbloody.  No prior similar symptoms.  When asked about bad food exposures she says possibly.  She has a history of diabetes, last A1c was 5.6.  No additional medical problems.  No prior abdominal surgeries.    Past Medical History:  Diagnosis Date   Allergy    as a child and young adult   Arthritis    Oa left shoulder   Asthma    as a child and young adult    Patient Active Problem List   Diagnosis Date Noted   Uncontrolled type 2 diabetes mellitus with hyperglycemia ([place]) 12/26/2020   Type 2 diabetes mellitus without complication, without long-term current use of insulin ([place]) 10/17/2019   Mild intermittent asthma without complication 35/36/1443   Family history of colon cancer in mother 09/13/2018   Postmenopausal estrogen deficiency 09/13/2018   Pain of left shoulder joint on movement 05/07/2018   Trigger finger of right hand 05/22/2017   Family history of breast cancer in sister 03/09/2015    Past Surgical History:  Procedure Laterality Date   BREAST SURGERY Right 1970 (age 35)   benign breast tumors   CESAREAN SECTION     x1   COLONOSCOPY  2004   ~16 yrs ago- was normal    trigger thumb  05/2017    r hand     OB History   No obstetric history on file.     Family History  Problem Relation Age of Onset   Cancer Mother 36       colon- died age 91    Colon cancer Mother    COPD Father    Heart disease Father    Cancer Sister 56       breast   Breast cancer Sister    Cancer Sister 109       breast; had a lumpectomy and is on tamoxifen   Breast cancer Sister    Colon polyps Sister    Esophageal cancer Neg Hx    Rectal cancer Neg Hx    Stomach cancer Neg Hx    Diabetes Neg Hx     Social History   Tobacco Use   Smoking status: Never   Smokeless tobacco: Never  Vaping Use   Vaping Use: Never used  Substance Use Topics   Alcohol use: No    Alcohol/week: 0.0 standard drinks   Drug use: Never    Home Medications Prior to Admission medications   Medication Sig Start Date End Date Taking? Authorizing Provider  albuterol (VENTOLIN HFA) 108 (90 Base) MCG/ACT inhaler Inhale 2 puffs into the lungs every 4 (four) hours as needed for wheezing or shortness of breath (cough,  shortness of breath or wheezing.). 09/13/18   Jacelyn Pi, Lilia Argue, MD  diclofenac sodium (VOLTAREN) 1 % GEL APP 4 GRAMS AA EXT QID 05/07/18   [provider]  ibuprofen (ADVIL) 200 MG tablet Take 200 mg by mouth every 6 (six) hours as needed.    [provider]  insulin degludec (TRESIBA FLEXTOUCH) 100 UNIT/ML FlexTouch Pen Inject 10 Units into the skin daily. And pen needles 1/day 04/08/21   Renato Shin, MD  Insulin Pen Needle 29G X 12.7MM MISC Inject 15 Units into the skin daily. 11/09/20   Maximiano Coss, NP  methocarbamol (ROBAXIN) 500 MG tablet Take 1 tablet (500 mg total) by mouth at bedtime as needed for muscle spasms. 09/25/20   Maximiano Coss, NP  pantoprazole (PROTONIX) 40 MG tablet Take 1 tablet (40 mg total) by mouth daily. 04/11/20   Maximiano Coss, NP  polyethylene glycol powder (GLYCOLAX/MIRALAX) 17 GM/SCOOP powder Take 17 g by mouth 2 (two) times daily as needed. 04/11/20    Maximiano Coss, NP  SUMAtriptan-naproxen (TREXIMET) 85-500 MG tablet Take 1 tablet by mouth every 2 (two) hours as needed for migraine. Do not exceed two doses in 1 day. 05/23/20   Maximiano Coss, NP    Allergies    Patient has no known allergies.  Review of Systems   Review of Systems  Gastrointestinal:  Positive for abdominal pain and vomiting.  All other systems reviewed and are negative.  Physical Exam Updated Vital Signs BP (!) 181/83   Pulse 87   Resp 16   SpO2 100%   Physical Exam Vitals and nursing note reviewed.  Constitutional:      General: She is in acute distress.     Appearance: She is well-developed.  HENT:     Head: Normocephalic and atraumatic.  Cardiovascular:     Rate and Rhythm: Normal rate and regular rhythm.     Heart sounds: No murmur heard. Pulmonary:     Effort: Pulmonary effort is normal. No respiratory distress.     Breath sounds: Normal breath sounds.  Abdominal:     Palpations: Abdomen is soft.     Comments: Generalized abdominal tenderness  Musculoskeletal:        General: No tenderness.  Skin:    General: Skin is warm and dry.  Neurological:     Mental Status: She is alert and oriented to person, place, and time.  Psychiatric:        Behavior: Behavior normal.    ED Results / Procedures / Treatments   Labs (all labs ordered are listed, but only abnormal results are displayed) Labs Reviewed  CBC WITH DIFFERENTIAL/PLATELET - Abnormal; Notable for the following components:      Result Value   RBC 5.21 (*)    All other components within normal limits  COMPREHENSIVE METABOLIC PANEL - Abnormal; Notable for the following components:   Potassium 2.7 (*)    CO2 17 (*)    Glucose, Bld 206 (*)    Total Protein 8.5 (*)    All other components within normal limits  URINALYSIS, ROUTINE W REFLEX MICROSCOPIC - Abnormal; Notable for the following components:   Color, Urine STRAW (*)    Glucose, UA >=500 (*)    Ketones, ur 20 (*)    All  other components within normal limits  BLOOD GAS, VENOUS - Abnormal; Notable for the following components:   pCO2, Ven 36.7 (*)    pO2, Ven 67.2 (*)    Bicarbonate 16.8 (*)  Acid-base deficit 8.8 (*)    All other components within normal limits  CBG MONITORING, ED - Abnormal; Notable for the following components:   Glucose-Capillary 193 (*)    All other components within normal limits  RESP PANEL BY RT-PCR (FLU A&B, COVID) ARPGX2  LIPASE, BLOOD  ETHANOL  MAGNESIUM  LACTIC ACID, PLASMA  LACTIC ACID, PLASMA    EKG None  Radiology CT Abdomen Pelvis W Contrast  Result Date: 04/10/2021 CLINICAL DATA:  70 year old female with history of nonlocalized abdominal pain. Nausea and vomiting. EXAM: CT ABDOMEN AND PELVIS WITH CONTRAST TECHNIQUE: Multidetector CT imaging of the abdomen and pelvis was performed using the standard protocol following bolus administration of intravenous contrast. CONTRAST:  28mL OMNIPAQUE IOHEXOL 350 MG/ML SOLN COMPARISON:  CT the abdomen and pelvis 11/29/2020. FINDINGS: Lower chest: Unremarkable. Hepatobiliary: In segment 4B of the liver adjacent to the falciform ligament there is a 1.1 cm low attenuation area, favored to represent a benign perfusion anomaly or some focal fatty infiltration. No other suspicious cystic or solid hepatic lesions. No intra or extrahepatic biliary ductal dilatation. Gallbladder is normal in appearance. Pancreas: No pancreatic mass. No pancreatic ductal dilatation. No pancreatic or peripancreatic fluid collections or inflammatory changes. Spleen: Unremarkable. Adrenals/Urinary Tract: Bilateral kidneys and adrenal glands are normal in appearance. No hydroureteronephrosis. Urinary bladder is normal in appearance. Stomach/Bowel: Stomach is normal in appearance. There are multiple prominent borderline dilated loops of mid to distal small bowel measuring up to 2.9 cm in diameter, which contains some air-fluid levels, and demonstrate thin poorly  enhancing bowel wall. Proximal small bowel and stomach are completely decompressed. Distal and terminal ileum are also completely decompressed. Paucity of gas and stool noted in the colon. Normal appendix. Vascular/Lymphatic: Minimal atherosclerosis in the abdominal and pelvic vasculature. Celiac axis, superior mesenteric artery and inferior mesenteric artery all appear grossly patent at this time. No lymphadenopathy noted in the abdomen or pelvis. Reproductive: Uterus and ovaries are unremarkable in appearance. Other: Small volume of ascites.  No pneumoperitoneum. Musculoskeletal: There are no aggressive appearing lytic or blastic lesions noted in the visualized portions of the skeleton. IMPRESSION: 1. Borderline dilated loops of mid to distal small bowel with multiple air-fluid levels, and thin poorly enhancing bowel wall, highly concerning for acute bowel ischemia. Given the gross patency of the major mesenteric vessels, and the overall appearance, the possibility of a closed loop small-bowel obstruction with associated bowel ischemia should be considered. Surgical consultation is strongly recommended at this time. 2. Small volume of ascites. 3. Atherosclerosis. Critical Value/emergent results were called by telephone at the time of interpretation on 04/10/2021 at 6:32 am to provider [facility] , who verbally acknowledged these results. Electronically Signed   By: Vinnie Langton M.D.   On: 04/10/2021 06:36    Procedures Procedures  CRITICAL CARE Performed by: Quintella Reichert   Total critical care time: 40 minutes  Critical care time was exclusive of separately billable procedures and treating other patients.  Critical care was necessary to treat or prevent imminent or life-threatening deterioration.  Critical care was time spent personally by me on the following activities: development of treatment plan with patient and/or surrogate as well as nursing, discussions with consultants, evaluation of  patient's response to treatment, examination of patient, obtaining history from patient or surrogate, ordering and performing treatments and interventions, ordering and review of laboratory studies, ordering and review of radiographic studies, pulse oximetry and re-evaluation of patient's condition.  Medications Ordered in ED Medications  potassium chloride SA (  KLOR-CON M) CR tablet 40 mEq (40 mEq Oral Not Given 04/10/21 0513)  potassium chloride 10 mEq in 100 mL IVPB (10 mEq Intravenous New Bag/Given 04/10/21 0624)  sodium chloride 0.9 % bolus 1,000 mL (1,000 mLs Intravenous New Bag/Given 04/10/21 0512)  ondansetron (ZOFRAN) injection 4 mg (4 mg Intravenous Given 04/10/21 0512)  fentaNYL (SUBLIMAZE) injection 50 mcg (50 mcg Intravenous Given 04/10/21 0511)  iohexol (OMNIPAQUE) 350 MG/ML injection 80 mL (80 mLs Intravenous Contrast Given 04/10/21 0541)  fentaNYL (SUBLIMAZE) injection 50 mcg (50 mcg Intravenous Given 04/10/21 1761)    ED Course  I have reviewed the triage vital signs and the nursing notes.  Pertinent labs & imaging results that were available during my care of the patient were reviewed by me and considered in my medical decision making (see chart for details).    MDM Rules/Calculators/A&P                          Patient here for evaluation of acute onset left-sided abdominal pain with numerous episodes of emesis.  She has generalized tenderness on examination, pain out of proportion to exam.  Labs significant for hypokalemia.  She was treated with IV fluids, potassium repletion.  CT scan is concerning for closed-loop bowel obstruction and possible bowel ischemia.  Discussed with Dr. Marcello Moores, with general surgery who will evaluate the patient in the emergency department.  On repeat assessment after pain medications and fluids patient does have improvement in her symptoms with what she describes as significant decrease in her pain, but she does have persistent abdominal tenderness on  examination. Pt care transferred pending surgery eval.    Final Clinical Impression(s) / ED Diagnoses Final diagnoses:  SBO (small bowel obstruction) ([place])  Hypokalemia    Rx / DC Orders ED Discharge Orders     None        Quintella Reichert, MD 04/10/21 [phone]

## 2021-04-10 NOTE — H&P (Addendum)
[facility] Admission Note  Lindsey Bray [birth year]  [id number].    Requesting MD: Ralene Bathe, MD Chief Complaint/Reason for Consult: SBO, possible closed loop  HPI:  Lindsey Bray is a 70 year old female with a past medical history of diabetes who presented to the emergency room with a chief complaint of abdominal pain.  Patient reports acute onset abdominal pain last night around 10:30 PM after she ate a sugar-free pudding.  Pain described as constant, sharp, diffuse abdominal pain.  Pain was nonradiating.  Patient reports associated vomiting and diarrhea.  States it felt sort of like when she had food poisoning in the past.  Denies a known history of bowel obstruction.  In the emergency department her pain is slightly better after IV fluids and pain medication.  She states she is having flatus.  Last BM was yesterday night.  Patient denies tobacco, alcohol, or drug use.  She has a history of C-section.  Her son is at the bedside.  She denies use of blood thinning medications.   ROS: Review of Systems  Constitutional: Negative.   HENT: Negative.    Eyes: Negative.   Respiratory: Negative.    Cardiovascular: Negative.   Gastrointestinal:  Positive for abdominal pain, diarrhea, nausea and vomiting.  Genitourinary: Negative.   Musculoskeletal: Negative.   Skin: Negative.   Neurological: Negative.   Endo/Heme/Allergies: Negative.   Psychiatric/Behavioral: Negative.     Family History  Problem Relation Age of Onset   Cancer Mother 17       colon- died age 25    Colon cancer Mother    COPD Father    Heart disease Father    Cancer Sister 41       breast   Breast cancer Sister    Cancer Sister 78       breast; had a lumpectomy and is on tamoxifen   Breast cancer Sister    Colon polyps Sister    Esophageal cancer Neg Hx    Rectal cancer Neg Hx    Stomach cancer Neg Hx    Diabetes Neg Hx     Past Medical History:  Diagnosis Date   Allergy    as a child and young  adult   Arthritis    Oa left shoulder   Asthma    as a child and young adult    Past Surgical History:  Procedure Laterality Date   BREAST SURGERY Right 29 (age 5)   benign breast tumors   CESAREAN SECTION     x1   COLONOSCOPY  2004   ~16 yrs ago- was normal    trigger thumb  05/2017   r hand    Social History:  reports that she has never smoked. She has never used smokeless tobacco. She reports that she does not drink alcohol and does not use drugs.  Allergies: No Known Allergies  (Not in a hospital admission)   Blood pressure (!) 159/89, pulse 85, resp. rate 18, SpO2 100 %. Physical Exam: Constitutional: NAD; conversant; appears unwell but is not in acute distress Eyes: Moist conjunctiva; no lid lag; anicteric; PERRL Neck: Trachea midline; no thyromegaly Lungs: Normal respiratory effort; clear to auscultation bilaterally CV: RRR; no palpable thrills; no pitting peripheral edema GI: Abd soft, non-distended; globally tender, worse in the right hemiabdomen without peritonitis, no palpable hepatosplenomegaly MSK: Symmetrical, no clubbing/cyanosis Psychiatric: Appropriate affect; alert and oriented x3 Neurologic: Cranial nerves II through XII grossly intact, no focal deficit, gait not assessed  Results  for orders placed or performed during the hospital encounter of 04/10/21 (from the past 48 hour(s))  CBC with Differential     Status: Abnormal   Collection Time: 04/10/21  3:30 AM  Result Value Ref Range   WBC 5.9 4.0 - 10.5 K/uL   RBC 5.21 (H) 3.87 - 5.11 MIL/uL   Hemoglobin 13.9 12.0 - 15.0 g/dL   HCT 42.8 36.0 - 46.0 %   MCV 82.1 80.0 - 100.0 fL   MCH 26.7 26.0 - 34.0 pg   MCHC 32.5 30.0 - 36.0 g/dL   RDW 13.2 11.5 - 15.5 %   Platelets 239 150 - 400 K/uL   nRBC 0.0 0.0 - 0.2 %   Neutrophils Relative % 77 %   Neutro Abs 4.6 1.7 - 7.7 K/uL   Lymphocytes Relative 17 %   Lymphs Abs 1.0 0.7 - 4.0 K/uL   Monocytes Relative 4 %   Monocytes Absolute 0.2 0.1 - 1.0  K/uL   Eosinophils Relative 1 %   Eosinophils Absolute 0.0 0.0 - 0.5 K/uL   Basophils Relative 1 %   Basophils Absolute 0.0 0.0 - 0.1 K/uL   Immature Granulocytes 0 %   Abs Immature Granulocytes 0.02 0.00 - 0.07 K/uL    Comment: Performed at [facility], [place] [address] [place], [place] [address]  Comprehensive metabolic panel     Status: Abnormal   Collection Time: 04/10/21  3:30 AM  Result Value Ref Range   Sodium 139 135 - 145 mmol/L   Potassium 2.7 (LL) 3.5 - 5.1 mmol/L    Comment: CRITICAL RESULT CALLED TO, READ BACK BY AND VERIFIED WITH:  Lyn Hollingshead RN 04/10/21 @ 0427 VS    Chloride 108 98 - 111 mmol/L   CO2 17 (L) 22 - 32 mmol/L   Glucose, Bld 206 (H) 70 - 99 mg/dL    Comment: Glucose reference range applies only to samples taken after fasting for at least 8 hours.   BUN 17 8 - 23 mg/dL   Creatinine, Ser 0.71 0.44 - 1.00 mg/dL   Calcium 9.3 8.9 - 10.3 mg/dL   Total Protein 8.5 (H) 6.5 - 8.1 g/dL   Albumin 4.6 3.5 - 5.0 g/dL   AST 34 15 - 41 U/L   ALT 27 0 - 44 U/L   Alkaline Phosphatase 68 38 - 126 U/L   Total Bilirubin 0.7 0.3 - 1.2 mg/dL   GFR, Estimated >60 >60 mL/min    Comment: (NOTE) Calculated using the CKD-EPI Creatinine Equation (2021)    Anion gap 14 5 - 15    Comment: Performed at [facility], [place] [address], [place], [place] [address]  Lipase, blood     Status: None   Collection Time: 04/10/21  3:30 AM  Result Value Ref Range   Lipase 25 11 - 51 U/L    Comment: Performed at [facility], [place] [address], [place], [place] [address]  Ethanol     Status: None   Collection Time: 04/10/21  3:30 AM  Result Value Ref Range   Alcohol, Ethyl (B) <10 <10 mg/dL    Comment: (NOTE) Lowest detectable limit for serum alcohol is 10 mg/dL.  For medical purposes only. Performed at [facility], [place] [address] [place], [place] [address]   CBG monitoring, ED     Status: Abnormal    Collection Time: 04/10/21  4:34 AM  Result Value Ref Range   Glucose-Capillary 193 (H) 70 -  99 mg/dL    Comment: Glucose reference range applies only to samples taken after fasting for at least 8 hours.  Magnesium     Status: None   Collection Time: 04/10/21  5:38 AM  Result Value Ref Range   Magnesium 1.7 1.7 - 2.4 mg/dL    Comment: Performed at [facility], [place] [address] [place], [place] [address]  Blood gas, venous     Status: Abnormal   Collection Time: 04/10/21  5:38 AM  Result Value Ref Range   pH, Ven 7.282 7.250 - 7.430   pCO2, Ven 36.7 (L) 44.0 - 60.0 mmHg   pO2, Ven 67.2 (H) 32.0 - 45.0 mmHg   Bicarbonate 16.8 (L) 20.0 - 28.0 mmol/L   Acid-base deficit 8.8 (H) 0.0 - 2.0 mmol/L   O2 Saturation 89.4 %   Patient temperature 98.6     Comment: Performed at [facility], [place] [address], [place], [place] [address]  Resp Panel by RT-PCR (Flu A&B, Covid) Urine, Clean Catch     Status: None   Collection Time: 04/10/21  6:05 AM   Specimen: Urine, Clean Catch; Nasopharyngeal(NP) swabs in vial transport medium  Result Value Ref Range   SARS Coronavirus 2 by RT PCR NEGATIVE NEGATIVE    Comment: (NOTE) SARS-CoV-2 target nucleic acids are NOT DETECTED.  The SARS-CoV-2 RNA is generally detectable in upper respiratory specimens during the acute phase of infection. The lowest concentration of SARS-CoV-2 viral copies this assay can detect is 138 copies/mL. A negative result does not preclude SARS-Cov-2 infection and should not be used as the sole basis for treatment or other patient management decisions. A negative result may occur with  improper specimen collection/handling, submission of specimen other than nasopharyngeal swab, presence of viral mutation(s) within the areas targeted by this assay, and inadequate number of viral copies(<138 copies/mL). A negative result must be combined with clinical observations, patient history, and  epidemiological information. The expected result is Negative.  Fact Sheet for Patients:  EntrepreneurPulse.com.au  Fact Sheet for Healthcare Providers:  IncredibleEmployment.be  This test is no t yet approved or cleared by the [place] FDA and  has been authorized for detection and/or diagnosis of SARS-CoV-2 by FDA under an Emergency Use Authorization (EUA). This EUA will remain  in effect (meaning this test can be used) for the duration of the COVID-19 declaration under Section 564(b)(1) of the Act, 21 U.S.C.section 360bbb-3(b)(1), unless the authorization is terminated  or revoked sooner.       Influenza A by PCR NEGATIVE NEGATIVE   Influenza B by PCR NEGATIVE NEGATIVE    Comment: (NOTE) The Xpert Xpress SARS-CoV-2/FLU/RSV plus assay is intended as an aid in the diagnosis of influenza from Nasopharyngeal swab specimens and should not be used as a sole basis for treatment. Nasal washings and aspirates are unacceptable for Xpert Xpress SARS-CoV-2/FLU/RSV testing.  Fact Sheet for Patients: EntrepreneurPulse.com.au  Fact Sheet for Healthcare Providers: IncredibleEmployment.be  This test is not yet approved or cleared by the [place] FDA and has been authorized for detection and/or diagnosis of SARS-CoV-2 by FDA under an Emergency Use Authorization (EUA). This EUA will remain in effect (meaning this test can be used) for the duration of the COVID-19 declaration under Section 564(b)(1) of the Act, 21 U.S.C. section 360bbb-3(b)(1), unless the authorization is terminated or revoked.  Performed at [facility], [place] [address], [place], [place] [address]   Urinalysis, Routine w reflex microscopic Urine, Clean Catch  Status: Abnormal   Collection Time: 04/10/21  6:15 AM  Result Value Ref Range   Color, Urine STRAW (A) YELLOW   APPearance CLEAR CLEAR   Specific Gravity, Urine  1.024 1.005 - 1.030   pH 6.0 5.0 - 8.0   Glucose, UA >=500 (A) NEGATIVE mg/dL   Hgb urine dipstick NEGATIVE NEGATIVE   Bilirubin Urine NEGATIVE NEGATIVE   Ketones, ur 20 (A) NEGATIVE mg/dL   Protein, ur NEGATIVE NEGATIVE mg/dL   Nitrite NEGATIVE NEGATIVE   Leukocytes,Ua NEGATIVE NEGATIVE   RBC / HPF 0-5 0 - 5 RBC/hpf   WBC, UA 0-5 0 - 5 WBC/hpf   Bacteria, UA NONE SEEN NONE SEEN   Squamous Epithelial / LPF 0-5 0 - 5    Comment: Performed at [facility], [place] [address], [place], [place] [address]   CT Abdomen Pelvis W Contrast  Result Date: 04/10/2021 CLINICAL DATA:  70 year old female with history of nonlocalized abdominal pain. Nausea and vomiting. EXAM: CT ABDOMEN AND PELVIS WITH CONTRAST TECHNIQUE: Multidetector CT imaging of the abdomen and pelvis was performed using the standard protocol following bolus administration of intravenous contrast. CONTRAST:  59mL OMNIPAQUE IOHEXOL 350 MG/ML SOLN COMPARISON:  CT the abdomen and pelvis 11/29/2020. FINDINGS: Lower chest: Unremarkable. Hepatobiliary: In segment 4B of the liver adjacent to the falciform ligament there is a 1.1 cm low attenuation area, favored to represent a benign perfusion anomaly or some focal fatty infiltration. No other suspicious cystic or solid hepatic lesions. No intra or extrahepatic biliary ductal dilatation. Gallbladder is normal in appearance. Pancreas: No pancreatic mass. No pancreatic ductal dilatation. No pancreatic or peripancreatic fluid collections or inflammatory changes. Spleen: Unremarkable. Adrenals/Urinary Tract: Bilateral kidneys and adrenal glands are normal in appearance. No hydroureteronephrosis. Urinary bladder is normal in appearance. Stomach/Bowel: Stomach is normal in appearance. There are multiple prominent borderline dilated loops of mid to distal small bowel measuring up to 2.9 cm in diameter, which contains some air-fluid levels, and demonstrate thin poorly enhancing bowel wall.  Proximal small bowel and stomach are completely decompressed. Distal and terminal ileum are also completely decompressed. Paucity of gas and stool noted in the colon. Normal appendix. Vascular/Lymphatic: Minimal atherosclerosis in the abdominal and pelvic vasculature. Celiac axis, superior mesenteric artery and inferior mesenteric artery all appear grossly patent at this time. No lymphadenopathy noted in the abdomen or pelvis. Reproductive: Uterus and ovaries are unremarkable in appearance. Other: Small volume of ascites.  No pneumoperitoneum. Musculoskeletal: There are no aggressive appearing lytic or blastic lesions noted in the visualized portions of the skeleton. IMPRESSION: 1. Borderline dilated loops of mid to distal small bowel with multiple air-fluid levels, and thin poorly enhancing bowel wall, highly concerning for acute bowel ischemia. Given the gross patency of the major mesenteric vessels, and the overall appearance, the possibility of a closed loop small-bowel obstruction with associated bowel ischemia should be considered. Surgical consultation is strongly recommended at this time. 2. Small volume of ascites. 3. Atherosclerosis. Critical Value/emergent results were called by telephone at the time of interpretation on 04/10/2021 at 6:32 am to provider [facility] , who verbally acknowledged these results. Electronically Signed   By: Vinnie Langton M.D.   On: 04/10/2021 06:36     Assessment/Plan Possible closed-loop bowel obstruction 70 year old female with no previous known history of bowel obstruction and surgical history of cesarean section who presents with acute onset abdominal pain that started less than 12 hours ago.  On exam she is tender, but not peritonitic.  She has not had emesis since last night. Her vital signs are within normal limits, she does not have a leukocytosis, lactic acid is pending.  Her CT scan shows a decompressed stomach but a loop of dilated small bowel in the right  hemiabdomen with poorly enhancing bowel wall concerning for possible ischemic changes.  At this time I recommend bowel rest, IV fluids.  We will discuss utility of empiric IV antibiotics with Dr. Marlou Starks, given the possibility of ischemia.  I do not think patient warrants emergent surgery at this time but I do think we should follow her abdominal exam closely, continue IV fluid resuscitation and replace her electrolytes, with a low threshold to take her to surgery if she clinically worsens or fails to improve.  Plan to place NG tube and reassess this morning.  If her pain fails to improve or she clinically worsens she will warrant diagnostic laparoscopy, possible exploratory laparotomy, possible bowel resection.   FEN: NPO, IVF; hypokalemia-given 3 runs IV KCl by EDP.  Give 3 more runs of IV KCl, start D51/2NS +20 KCl at 100 cc/h, Mg 1.7 - give 3g Mg Sulfate IV ID: None, may start zosyn pending discussion with MD VTE: SCD's, chemical VTE held for possible surgery  Foley: none Dispo: admit to CCS service  Diabetes Mellitus - SSI, consul DM coordinator, A1c pending GERD - IV PPI    Jill Alexanders, PA-C [facility] Please see Amion for pager number during day hours 7:00am-4:30pm 04/10/2021, 9:21 AM

## 2021-04-10 NOTE — ED Triage Notes (Signed)
Pt BIB EMS with complaints of nausea, vomiting, and abdominal pain x 3 hrs.   20 g iv left ac 500 ml ns 4 mg zofran

## 2021-04-11 ENCOUNTER — Inpatient Hospital Stay (HOSPITAL_COMMUNITY): Payer: Medicare HMO

## 2021-04-11 LAB — COMPREHENSIVE METABOLIC PANEL
ALT: 18 U/L (ref 0–44)
AST: 21 U/L (ref 15–41)
Albumin: 3.3 g/dL — ABNORMAL LOW (ref 3.5–5.0)
Alkaline Phosphatase: 54 U/L (ref 38–126)
Anion gap: 4 — ABNORMAL LOW (ref 5–15)
BUN: 13 mg/dL (ref 8–23)
CO2: 22 mmol/L (ref 22–32)
Calcium: 8.4 mg/dL — ABNORMAL LOW (ref 8.9–10.3)
Chloride: 114 mmol/L — ABNORMAL HIGH (ref 98–111)
Creatinine, Ser: 0.54 mg/dL (ref 0.44–1.00)
GFR, Estimated: >60 mL/min (ref >60)
Glucose, Bld: 108 mg/dL — ABNORMAL HIGH (ref 70–99)
Potassium: 4 mmol/L (ref 3.5–5.1)
Sodium: 140 mmol/L (ref 135–145)
Total Bilirubin: 0.6 mg/dL (ref 0.3–1.2)
Total Protein: 6.2 g/dL — ABNORMAL LOW (ref 6.5–8.1)

## 2021-04-11 LAB — CBC
HCT: 39.1 % (ref 36.0–46.0)
Hemoglobin: 13 g/dL (ref 12.0–15.0)
MCH: 27.4 pg (ref 26.0–34.0)
MCHC: 33.2 g/dL (ref 30.0–36.0)
MCV: 82.3 fL (ref 80.0–100.0)
Platelets: 194 10*3/uL (ref 150–400)
RBC: 4.75 MIL/uL (ref 3.87–5.11)
RDW: 13.8 % (ref 11.5–15.5)
WBC: 6 10*3/uL (ref 4.0–10.5)
nRBC: 0 % (ref 0.0–0.2)

## 2021-04-11 LAB — GLUCOSE, CAPILLARY
Glucose-Capillary: 99 mg/dL (ref 70–99)
Glucose-Capillary: 92 mg/dL (ref 70–99)
Glucose-Capillary: 87 mg/dL (ref 70–99)
Glucose-Capillary: 101 mg/dL — ABNORMAL HIGH (ref 70–99)
Glucose-Capillary: 98 mg/dL (ref 70–99)

## 2021-04-11 MED ORDER — ENOXAPARIN SODIUM 40 MG/0.4ML IJ SOSY
40.0000 mg | PREFILLED_SYRINGE | INTRAMUSCULAR | Status: DC
  Administered 2021-04-11 – 2021-04-15 (×5): 40 mg via SUBCUTANEOUS
  Filled 2021-04-11 (×6): qty 0.4

## 2021-04-11 NOTE — Progress Notes (Signed)
[facility] Progress Note     Subjective: CC:  NAEO. Reports ongoing mild abdominal pain - not much better, not worse. Reports minimal flatus. Denies BM. Denies nausea or vomiting. NG tube not putting out much  Objective: Vital signs in last 24 hours: Temp:  [98.3 F (36.8 C)-99.4 F (37.4 C)] 99.1 F (37.3 C) (12/08 0945) Pulse Rate:  [67-135] 74 (12/08 0945) Resp:  [15-33] 16 (12/08 0945) BP: (125-152)/(67-83) 125/67 (12/08 0945) SpO2:  [95 %-100 %] 100 % (12/08 0945) Weight:  [60.5 kg] 60.5 kg (12/07 1700) Last BM Date: 04/09/21  Intake/Output from previous day: 12/07 0701 - 12/08 0700 In: 3593.7 [I.V.:2293.7; IV Piggyback:1300] Out: 100 [Emesis/NG output:100] Intake/Output this shift: Total I/O In: -  Out: 270 [Urine:200; Emesis/NG output:70]  PE: Gen:  Alert, NAD, pleasant Card:  Regular rate and rhythm, pedal pulses 2+ BL Pulm:  Normal effort, clear to auscultation bilaterally Abd: Soft, mild distention, mild lower abdominal tenderness without guarding, +BS hypoactive Skin: warm and dry, no rashes  Psych: A&Ox3   Lab Results:  Recent Labs    04/10/21 0330 04/11/21 0427  WBC 5.9 6.0  HGB 13.9 13.0  HCT 42.8 39.1  PLT 239 194   BMET Recent Labs    04/10/21 0330 04/11/21 0427  NA 139 140  K 2.7* 4.0  CL 108 114*  CO2 17* 22  GLUCOSE 206* 108*  BUN 17 13  CREATININE 0.71 0.54  CALCIUM 9.3 8.4*   PT/INR No results for input(s): LABPROT, INR in the last 72 hours. CMP     Component Value Date/Time   NA 140 04/11/2021 0427   NA 143 04/11/2020 1148   K 4.0 04/11/2021 0427   CL 114 (H) 04/11/2021 0427   CO2 22 04/11/2021 0427   GLUCOSE 108 (H) 04/11/2021 0427   BUN 13 04/11/2021 0427   BUN 12 04/11/2020 1148   CREATININE 0.54 04/11/2021 0427   CALCIUM 8.4 (L) 04/11/2021 0427   PROT 6.2 (L) 04/11/2021 0427   PROT 7.6 04/11/2020 1148   ALBUMIN 3.3 (L) 04/11/2021 0427   ALBUMIN 4.6 04/11/2020 1148   AST 21 04/11/2021 0427   ALT  18 04/11/2021 0427   ALKPHOS 54 04/11/2021 0427   BILITOT 0.6 04/11/2021 0427   BILITOT 0.6 04/11/2020 1148   GFRNONAA >60 04/11/2021 0427   GFRAA 88 04/11/2020 1148   Lipase     Component Value Date/Time   LIPASE 25 04/10/2021 0330       Studies/Results: CT Abdomen Pelvis W Contrast  Result Date: 04/10/2021 CLINICAL DATA:  70 year old female with history of nonlocalized abdominal pain. Nausea and vomiting. EXAM: CT ABDOMEN AND PELVIS WITH CONTRAST TECHNIQUE: Multidetector CT imaging of the abdomen and pelvis was performed using the standard protocol following bolus administration of intravenous contrast. CONTRAST:  76mL OMNIPAQUE IOHEXOL 350 MG/ML SOLN COMPARISON:  CT the abdomen and pelvis 11/29/2020. FINDINGS: Lower chest: Unremarkable. Hepatobiliary: In segment 4B of the liver adjacent to the falciform ligament there is a 1.1 cm low attenuation area, favored to represent a benign perfusion anomaly or some focal fatty infiltration. No other suspicious cystic or solid hepatic lesions. No intra or extrahepatic biliary ductal dilatation. Gallbladder is normal in appearance. Pancreas: No pancreatic mass. No pancreatic ductal dilatation. No pancreatic or peripancreatic fluid collections or inflammatory changes. Spleen: Unremarkable. Adrenals/Urinary Tract: Bilateral kidneys and adrenal glands are normal in appearance. No hydroureteronephrosis. Urinary bladder is normal in appearance. Stomach/Bowel: Stomach is normal in appearance. There are multiple  prominent borderline dilated loops of mid to distal small bowel measuring up to 2.9 cm in diameter, which contains some air-fluid levels, and demonstrate thin poorly enhancing bowel wall. Proximal small bowel and stomach are completely decompressed. Distal and terminal ileum are also completely decompressed. Paucity of gas and stool noted in the colon. Normal appendix. Vascular/Lymphatic: Minimal atherosclerosis in the abdominal and pelvic vasculature.  Celiac axis, superior mesenteric artery and inferior mesenteric artery all appear grossly patent at this time. No lymphadenopathy noted in the abdomen or pelvis. Reproductive: Uterus and ovaries are unremarkable in appearance. Other: Small volume of ascites.  No pneumoperitoneum. Musculoskeletal: There are no aggressive appearing lytic or blastic lesions noted in the visualized portions of the skeleton. IMPRESSION: 1. Borderline dilated loops of mid to distal small bowel with multiple air-fluid levels, and thin poorly enhancing bowel wall, highly concerning for acute bowel ischemia. Given the gross patency of the major mesenteric vessels, and the overall appearance, the possibility of a closed loop small-bowel obstruction with associated bowel ischemia should be considered. Surgical consultation is strongly recommended at this time. 2. Small volume of ascites. 3. Atherosclerosis. Critical Value/emergent results were called by telephone at the time of interpretation on 04/10/2021 at 6:32 am to provider [facility] , who verbally acknowledged these results. Electronically Signed   By: Vinnie Langton M.D.   On: 04/10/2021 06:36   DG Abd Portable 1V-Small Bowel Obstruction Protocol-initial, 8 hr delay  Result Date: 04/11/2021 CLINICAL DATA:  Small bowel protocol. EXAM: PORTABLE ABDOMEN - 1 VIEW COMPARISON:  Abdominal radiograph dated 04/10/2021. FINDINGS: Enteric tube with tip in similar position. There is no bowel dilatation or evidence of obstruction. The no free air. No free air. The osseous structures are intact. The soft tissues are unremarkable. IMPRESSION: No bowel obstruction. Electronically Signed   By: Anner Crete M.D.   On: 04/11/2021 00:39   DG Abd Portable 1 View  Result Date: 04/10/2021 CLINICAL DATA:  Nasogastric tube placement EXAM: PORTABLE ABDOMEN - 1 VIEW COMPARISON:  None. FINDINGS: Nasogastric tube tip in the mid body of the stomach. Abdominal gas pattern is normal. IMPRESSION:  Nasogastric tube tip in the mid body of the stomach. Electronically Signed   By: Nelson Chimes M.D.   On: 04/10/2021 14:08    Anti-infectives: Anti-infectives (From admission, onward)    None        Assessment/Plan  Possible closed-loop bowel obstruction - low suspicion for a closed loop obstruction at this point. Her vitals are stable and she has no signs or sxs of bowel ischemia.  - KUB this AM is non-specific. Clinically she remains at least partially obstructed with minimal flatus and no BM.  - continue NG to LIWS and monitor return of bowel function   FEN: NPO, IVF; hypokalemia resolved ID: None VTE: SCD's, lovenox Foley: none Dispo: admit to CCS service   LOS: 1 day    Obie Dredge, [facility] Please see Amion for pager number during day hours 7:00am-4:30pm

## 2021-04-11 NOTE — Progress Notes (Addendum)
Date and time results received: 04/10/2021 at 2010  Test: Lactic Acid Critical Value: 2.0  Name of Provider Notified: Wilburn Mylar, MD returned page 04/10/2021 at 2027  Orders Received? Or Actions Taken?: Critical lab reported with no new orders given.

## 2021-04-12 ENCOUNTER — Inpatient Hospital Stay (HOSPITAL_COMMUNITY): Payer: Medicare HMO

## 2021-04-12 LAB — GLUCOSE, CAPILLARY
Glucose-Capillary: 104 mg/dL — ABNORMAL HIGH (ref 70–99)
Glucose-Capillary: 96 mg/dL (ref 70–99)
Glucose-Capillary: 89 mg/dL (ref 70–99)
Glucose-Capillary: 104 mg/dL — ABNORMAL HIGH (ref 70–99)

## 2021-04-12 LAB — BASIC METABOLIC PANEL
Anion gap: 3 — ABNORMAL LOW (ref 5–15)
BUN: 7 mg/dL — ABNORMAL LOW (ref 8–23)
CO2: 23 mmol/L (ref 22–32)
Calcium: 8.4 mg/dL — ABNORMAL LOW (ref 8.9–10.3)
Chloride: 111 mmol/L (ref 98–111)
Creatinine, Ser: 0.65 mg/dL (ref 0.44–1.00)
GFR, Estimated: >60 mL/min (ref >60)
Glucose, Bld: 109 mg/dL — ABNORMAL HIGH (ref 70–99)
Potassium: 3.8 mmol/L (ref 3.5–5.1)
Sodium: 137 mmol/L (ref 135–145)

## 2021-04-12 LAB — LACTIC ACID, PLASMA
Lactic Acid, Venous: 1 mmol/L (ref 0.5–1.9)
Lactic Acid, Venous: 0.8 mmol/L (ref 0.5–1.9)

## 2021-04-12 LAB — MAGNESIUM: Magnesium: 2 mg/dL (ref 1.7–2.4)

## 2021-04-12 MED ORDER — SALINE SPRAY 0.65 % NA SOLN
1.0000 | NASAL | Status: DC | PRN
  Filled 2021-04-12 (×2): qty 44

## 2021-04-12 NOTE — Progress Notes (Signed)
   04/12/21 1400  Mobility  Activity Ambulated in hall  Level of Assistance Modified independent, requires aide device or extra time  Assistive Device Other (Comment) (IV pole)  Distance Ambulated (ft) 500 ft  Mobility Ambulated with assistance in hallway  Mobility Response Tolerated well  Mobility performed by Mobility specialist  $Mobility charge 1 Mobility   Pt agreeable to mobilizing this afternoon. Ambulated about 565ft in hall with IV pole, tolerated well. No complaints. Left pt in bed with call bell at side. Rn notified of session.   [facility] Acute Rehab Services Office: [phone]

## 2021-04-12 NOTE — Progress Notes (Addendum)
[facility] Progress Note     Subjective: CC:  NAEO. Denies changes in abdominal pain - states its no better, no worse. Reports one episode flatus yesterday. None today. No BM yet. Reports she got OOB to chair yesterday.  Objective: Vital signs in last 24 hours: Temp:  [98.5 F (36.9 C)-98.8 F (37.1 C)] 98.8 F (37.1 C) (12/09 0550) Pulse Rate:  [80-87] 87 (12/09 0550) Resp:  [16] 16 (12/09 0550) BP: (128-144)/(74-76) 128/74 (12/09 0550) SpO2:  [98 %-100 %] 98 % (12/09 0550) Last BM Date: 04/09/21  Intake/Output from previous day: 12/08 0701 - 12/09 0700 In: 1888.7 [I.V.:1888.7] Out: 400 [Urine:200; Emesis/NG output:200] Intake/Output this shift: Total I/O In: -  Out: 201 [Urine:1; Emesis/NG output:200]  PE: Gen:  Alert, NAD, pleasant Card:  Regular rate and rhythm, pedal pulses 2+ BL Pulm:  Normal effort, clear to auscultation bilaterally Abd: Soft, mild distention, mild RLQ abdominal tenderness without guarding - overall unchanged compared to yesterday  NG - 200 cc/24h Skin: warm and dry, no rashes  Psych: A&Ox3   Lab Results:  Recent Labs    04/10/21 0330 04/11/21 0427  WBC 5.9 6.0  HGB 13.9 13.0  HCT 42.8 39.1  PLT 239 194   BMET Recent Labs    04/11/21 0427 04/12/21 0751  NA 140 137  K 4.0 3.8  CL 114* 111  CO2 22 23  GLUCOSE 108* 109*  BUN 13 7*  CREATININE 0.54 0.65  CALCIUM 8.4* 8.4*   PT/INR No results for input(s): LABPROT, INR in the last 72 hours. CMP     Component Value Date/Time   NA 137 04/12/2021 0751   NA 143 04/11/2020 1148   K 3.8 04/12/2021 0751   CL 111 04/12/2021 0751   CO2 23 04/12/2021 0751   GLUCOSE 109 (H) 04/12/2021 0751   BUN 7 (L) 04/12/2021 0751   BUN 12 04/11/2020 1148   CREATININE 0.65 04/12/2021 0751   CALCIUM 8.4 (L) 04/12/2021 0751   PROT 6.2 (L) 04/11/2021 0427   PROT 7.6 04/11/2020 1148   ALBUMIN 3.3 (L) 04/11/2021 0427   ALBUMIN 4.6 04/11/2020 1148   AST 21 04/11/2021 0427   ALT 18  04/11/2021 0427   ALKPHOS 54 04/11/2021 0427   BILITOT 0.6 04/11/2021 0427   BILITOT 0.6 04/11/2020 1148   GFRNONAA >60 04/12/2021 0751   GFRAA 88 04/11/2020 1148   Lipase     Component Value Date/Time   LIPASE 25 04/10/2021 0330       Studies/Results: DG Abd Portable 1V  Result Date: 04/12/2021 CLINICAL DATA:  Abdominal pain EXAM: PORTABLE ABDOMEN - 1 VIEW COMPARISON:  04/11/2021 FINDINGS: Bowel gas pattern is nonspecific. Small to moderate amount of stool is seen in the colon without signs of fecal impaction in the rectosigmoid. No abnormal masses or calcifications are seen. Kidneys are partly obscured by bowel contents. Tip of enteric tube is seen in the region of body of the stomach. Degenerative changes are noted in the lumbar spine with disc space narrowing, bony spurs and facet hypertrophy. IMPRESSION: Nonspecific bowel gas pattern. Electronically Signed   By: Elmer Picker M.D.   On: 04/12/2021 09:22   DG Abd Portable 1V-Small Bowel Obstruction Protocol-initial, 8 hr delay  Result Date: 04/11/2021 CLINICAL DATA:  Small bowel protocol. EXAM: PORTABLE ABDOMEN - 1 VIEW COMPARISON:  Abdominal radiograph dated 04/10/2021. FINDINGS: Enteric tube with tip in similar position. There is no bowel dilatation or evidence of obstruction. The no free air. No  free air. The osseous structures are intact. The soft tissues are unremarkable. IMPRESSION: No bowel obstruction. Electronically Signed   By: Anner Crete M.D.   On: 04/11/2021 00:39    Anti-infectives: Anti-infectives (From admission, onward)    None        Assessment/Plan  Possible closed-loop bowel obstruction - low suspicion for a closed loop obstruction at this point. Her vitals are stable and she has no signs or sxs of bowel ischemia. Lactate pending to confirm that this cleared (2.0 on admission). - Clinically the patient remains at least partially obstructed with minimal flatus and no BM. KUB ? Maybe slightly  better with more air in the colon. - continue NG to LIWS and monitor return of bowel function   FEN: NPO, IVF; hypokalemia resolved ID: None VTE: SCD's, lovenox Foley: none Dispo: inpatient, await bowel function, CBC/BMP in AM If patient does not show improvement in 24 hours she may warrant surgical exploration for ongoing obstruction.   LOS: 2 days    Obie Dredge, [facility] Please see Amion for pager number during day hours 7:00am-4:30pm

## 2021-04-12 NOTE — Progress Notes (Signed)
  Transition of Care (TOC) Screening Note   Patient Details  Name: Lindsey Bray Date of Birth: January 15, 1951   Transition of Care [facility]) CM/SW Contact:    Lennart Pall, LCSW Phone Number: 04/12/2021, 10:48 AM    Transition of Care Department [facility]) has reviewed patient and no TOC needs have been identified at this time. We will continue to monitor patient advancement through interdisciplinary progression rounds. If new patient transition needs arise, please place a TOC consult.   Brady Schiller, LCSW

## 2021-04-13 ENCOUNTER — Inpatient Hospital Stay (HOSPITAL_COMMUNITY): Payer: Medicare HMO

## 2021-04-13 LAB — CBC
HCT: 36.6 % (ref 36.0–46.0)
Hemoglobin: 11.6 g/dL — ABNORMAL LOW (ref 12.0–15.0)
MCH: 26.7 pg (ref 26.0–34.0)
MCHC: 31.7 g/dL (ref 30.0–36.0)
MCV: 84.1 fL (ref 80.0–100.0)
Platelets: 185 10*3/uL (ref 150–400)
RBC: 4.35 MIL/uL (ref 3.87–5.11)
RDW: 13.2 % (ref 11.5–15.5)
WBC: 5.2 10*3/uL (ref 4.0–10.5)
nRBC: 0 % (ref 0.0–0.2)

## 2021-04-13 LAB — BASIC METABOLIC PANEL
Anion gap: 6 (ref 5–15)
BUN: <5 mg/dL — ABNORMAL LOW (ref 8–23)
CO2: 25 mmol/L (ref 22–32)
Calcium: 8.7 mg/dL — ABNORMAL LOW (ref 8.9–10.3)
Chloride: 109 mmol/L (ref 98–111)
Creatinine, Ser: 0.55 mg/dL (ref 0.44–1.00)
GFR, Estimated: >60 mL/min (ref >60)
Glucose, Bld: 104 mg/dL — ABNORMAL HIGH (ref 70–99)
Potassium: 3.7 mmol/L (ref 3.5–5.1)
Sodium: 140 mmol/L (ref 135–145)

## 2021-04-13 LAB — GLUCOSE, CAPILLARY
Glucose-Capillary: 83 mg/dL (ref 70–99)
Glucose-Capillary: 93 mg/dL (ref 70–99)
Glucose-Capillary: 79 mg/dL (ref 70–99)
Glucose-Capillary: 99 mg/dL (ref 70–99)

## 2021-04-13 MED ORDER — DIATRIZOATE MEGLUMINE & SODIUM 66-10 % PO SOLN
90.0000 mL | Freq: Once | ORAL | Status: AC
  Administered 2021-04-13: 90 mL via NASOGASTRIC
  Filled 2021-04-13: qty 90

## 2021-04-13 NOTE — Progress Notes (Signed)
[facility] Progress Note     Subjective: CC:  Reports her abdominal pain is better.  With some right-sided pain.  Endorses small amount of flatus but no bowel movement.  Objective: Vital signs in last 24 hours: Temp:  [98 F (36.7 C)-98.7 F (37.1 C)] 98.7 F (37.1 C) (12/10 0528) Pulse Rate:  [73-77] 73 (12/10 0528) Resp:  [16-18] 18 (12/10 0528) BP: (141-148)/(71-74) 141/74 (12/10 0528) SpO2:  [100 %] 100 % (12/10 0528) Last BM Date: 04/09/21  Intake/Output from previous day: 12/09 0701 - 12/10 0700 In: 2383.2 [I.V.:2383.2] Out: 451 [Urine:1; Emesis/NG output:450] Intake/Output this shift: No intake/output data recorded.  PE: Gen:  Alert, NAD, pleasant Card:  Regular rate and rhythm, pedal pulses 2+ BL Pulm:  Normal effort, clear to auscultation bilaterally Abd: Soft, mild distention, minimal lower abdominal tenderness.  NG output bilious. Skin: warm and dry, no rashes  Psych: A&Ox3   Lab Results:  Recent Labs    04/11/21 0427 04/13/21 0456  WBC 6.0 5.2  HGB 13.0 11.6*  HCT 39.1 36.6  PLT 194 185    BMET Recent Labs    04/12/21 0751 04/13/21 0456  NA 137 140  K 3.8 3.7  CL 111 109  CO2 23 25  GLUCOSE 109* 104*  BUN 7* <5*  CREATININE 0.65 0.55  CALCIUM 8.4* 8.7*    PT/INR No results for input(s): LABPROT, INR in the last 72 hours. CMP     Component Value Date/Time   NA 140 04/13/2021 0456   NA 143 04/11/2020 1148   K 3.7 04/13/2021 0456   CL 109 04/13/2021 0456   CO2 25 04/13/2021 0456   GLUCOSE 104 (H) 04/13/2021 0456   BUN <5 (L) 04/13/2021 0456   BUN 12 04/11/2020 1148   CREATININE 0.55 04/13/2021 0456   CALCIUM 8.7 (L) 04/13/2021 0456   PROT 6.2 (L) 04/11/2021 0427   PROT 7.6 04/11/2020 1148   ALBUMIN 3.3 (L) 04/11/2021 0427   ALBUMIN 4.6 04/11/2020 1148   AST 21 04/11/2021 0427   ALT 18 04/11/2021 0427   ALKPHOS 54 04/11/2021 0427   BILITOT 0.6 04/11/2021 0427   BILITOT 0.6 04/11/2020 1148   GFRNONAA >60  04/13/2021 0456   GFRAA 88 04/11/2020 1148   Lipase     Component Value Date/Time   LIPASE 25 04/10/2021 0330       Studies/Results: DG Abd Portable 1V  Result Date: 04/12/2021 CLINICAL DATA:  Abdominal pain EXAM: PORTABLE ABDOMEN - 1 VIEW COMPARISON:  04/11/2021 FINDINGS: Bowel gas pattern is nonspecific. Small to moderate amount of stool is seen in the colon without signs of fecal impaction in the rectosigmoid. No abnormal masses or calcifications are seen. Kidneys are partly obscured by bowel contents. Tip of enteric tube is seen in the region of body of the stomach. Degenerative changes are noted in the lumbar spine with disc space narrowing, bony spurs and facet hypertrophy. IMPRESSION: Nonspecific bowel gas pattern. Electronically Signed   By: Elmer Picker M.D.   On: 04/12/2021 09:22    Anti-infectives: Anti-infectives (From admission, onward)    None        Assessment/Plan  Possible closed-loop bowel obstruction - low suspicion for a closed loop obstruction at this point. Her vitals are stable and she has no signs or sxs of bowel ischemia.  No acidosis, leukocytosis or AKI - Clinically the patient remains at least partially obstructed with minimal flatus and no BM. KUB ? Maybe slightly better with more air  in the colon. - continue NG to LIWS and monitor return of bowel function-output low volume but dark bilious Will start the small bowel obstruction protocol with contrast administration through the NG tube.   FEN: NPO, IVF; hypokalemia resolved ID: None VTE: SCD's, lovenox Foley: none Dispo: inpatient, await bowel function, CBC/BMP in AM If patient does not show improvement in 24 hours she may warrant surgical exploration for ongoing obstruction.   LOS: 3 days    [facility] Please see Amion for pager number during day hours 7:00am-4:30pm

## 2021-04-14 LAB — BASIC METABOLIC PANEL
Anion gap: 8 (ref 5–15)
BUN: <5 mg/dL — ABNORMAL LOW (ref 8–23)
CO2: 24 mmol/L (ref 22–32)
Calcium: 8.6 mg/dL — ABNORMAL LOW (ref 8.9–10.3)
Chloride: 108 mmol/L (ref 98–111)
Creatinine, Ser: 0.59 mg/dL (ref 0.44–1.00)
GFR, Estimated: >60 mL/min (ref >60)
Glucose, Bld: 102 mg/dL — ABNORMAL HIGH (ref 70–99)
Potassium: 3.4 mmol/L — ABNORMAL LOW (ref 3.5–5.1)
Sodium: 140 mmol/L (ref 135–145)

## 2021-04-14 LAB — CBC
HCT: 35.1 % — ABNORMAL LOW (ref 36.0–46.0)
Hemoglobin: 11.4 g/dL — ABNORMAL LOW (ref 12.0–15.0)
MCH: 27 pg (ref 26.0–34.0)
MCHC: 32.5 g/dL (ref 30.0–36.0)
MCV: 83.2 fL (ref 80.0–100.0)
Platelets: 192 10*3/uL (ref 150–400)
RBC: 4.22 MIL/uL (ref 3.87–5.11)
RDW: 13.2 % (ref 11.5–15.5)
WBC: 3.9 10*3/uL — ABNORMAL LOW (ref 4.0–10.5)
nRBC: 0 % (ref 0.0–0.2)

## 2021-04-14 LAB — GLUCOSE, CAPILLARY
Glucose-Capillary: 100 mg/dL — ABNORMAL HIGH (ref 70–99)
Glucose-Capillary: 106 mg/dL — ABNORMAL HIGH (ref 70–99)
Glucose-Capillary: 92 mg/dL (ref 70–99)
Glucose-Capillary: 103 mg/dL — ABNORMAL HIGH (ref 70–99)

## 2021-04-14 LAB — MAGNESIUM: Magnesium: 1.7 mg/dL (ref 1.7–2.4)

## 2021-04-14 MED ORDER — POTASSIUM CHLORIDE 10 MEQ/100ML IV SOLN
10.0000 meq | INTRAVENOUS | Status: AC
  Administered 2021-04-14 (×4): 10 meq via INTRAVENOUS
  Filled 2021-04-14: qty 100

## 2021-04-14 MED ORDER — MAGNESIUM SULFATE 2 GM/50ML IV SOLN
2.0000 g | Freq: Once | INTRAVENOUS | Status: AC
  Administered 2021-04-14: 2 g via INTRAVENOUS
  Filled 2021-04-14: qty 50

## 2021-04-14 MED ORDER — SODIUM CHLORIDE 0.9 % IV SOLN: INTRAVENOUS | Status: DC | PRN

## 2021-04-14 NOTE — Progress Notes (Signed)
Pt tolerating liquids and feels she is ready to have the tube removed.

## 2021-04-14 NOTE — Progress Notes (Signed)
[facility] Progress Note     Subjective: CC:  Reports continued improvement.  Several bowel movements.  X-ray yesterday with contrast in colon and no dilation of small bowel.  Objective: Vital signs in last 24 hours: Temp:  [98.7 F (37.1 C)-99.3 F (37.4 C)] 98.7 F (37.1 C) (12/11 0515) Pulse Rate:  [76-96] 76 (12/11 0515) Resp:  [17-18] 17 (12/11 0515) BP: (137-161)/(62-85) 137/66 (12/11 0515) SpO2:  [94 %-100 %] 100 % (12/11 0515) Last BM Date: 04/14/21  Intake/Output from previous day: 12/10 0701 - 12/11 0700 In: 2216.3 [I.V.:2216.3] Out: 100 [Emesis/NG output:100] Intake/Output this shift: No intake/output data recorded.  PE: Gen:  Alert, NAD, pleasant Card:  Regular rate and rhythm, pedal pulses 2+ BL Pulm:  Normal effort, clear to auscultation bilaterally Abd: Soft, no distention, minimal right lower abdominal tenderness.   Skin: warm and dry, no rashes  Psych: A&Ox3   Lab Results:  Recent Labs    04/13/21 0456 04/14/21 0440  WBC 5.2 3.9*  HGB 11.6* 11.4*  HCT 36.6 35.1*  PLT 185 192    BMET Recent Labs    04/13/21 0456 04/14/21 0440  NA 140 140  K 3.7 3.4*  CL 109 108  CO2 25 24  GLUCOSE 104* 102*  BUN <5* <5*  CREATININE 0.55 0.59  CALCIUM 8.7* 8.6*    PT/INR No results for input(s): LABPROT, INR in the last 72 hours. CMP     Component Value Date/Time   NA 140 04/14/2021 0440   NA 143 04/11/2020 1148   K 3.4 (L) 04/14/2021 0440   CL 108 04/14/2021 0440   CO2 24 04/14/2021 0440   GLUCOSE 102 (H) 04/14/2021 0440   BUN <5 (L) 04/14/2021 0440   BUN 12 04/11/2020 1148   CREATININE 0.59 04/14/2021 0440   CALCIUM 8.6 (L) 04/14/2021 0440   PROT 6.2 (L) 04/11/2021 0427   PROT 7.6 04/11/2020 1148   ALBUMIN 3.3 (L) 04/11/2021 0427   ALBUMIN 4.6 04/11/2020 1148   AST 21 04/11/2021 0427   ALT 18 04/11/2021 0427   ALKPHOS 54 04/11/2021 0427   BILITOT 0.6 04/11/2021 0427   BILITOT 0.6 04/11/2020 1148   GFRNONAA >60  04/14/2021 0440   GFRAA 88 04/11/2020 1148   Lipase     Component Value Date/Time   LIPASE 25 04/10/2021 0330       Studies/Results: DG Abd Portable 1V-Small Bowel Obstruction Protocol-initial, 8 hr delay  Result Date: 04/13/2021 CLINICAL DATA:  Small-bowel obstruction EXAM: PORTABLE ABDOMEN - 1 VIEW COMPARISON:  04/12/2021 FINDINGS: 2 supine frontal views of the abdomen and pelvis are obtained. Enteric catheter projects over the gastric body. Oral contrast is seen throughout the colon through the level of the rectum. No signs of obstruction or ileus. IMPRESSION: 1. Oral contrast throughout the colon. No evidence of bowel obstruction. Electronically Signed   By: Randa Ngo M.D.   On: 04/13/2021 19:38    Anti-infectives: Anti-infectives (From admission, onward)    None        Assessment/Plan  Sbo, Possible closed-loop bowel obstruction - low suspicion for a closed loop obstruction at this point. Her vitals are stable and she has no signs or sxs of bowel ischemia.  No acidosis, leukocytosis or AKI - Clinically she has improved tremendously in the last 48 hours and plain films demonstrate resolution of the obstruction. -Clamp NG tube today, start sips of clears.  If she is tolerating well, will remove NG tube this afternoon and advance to  clears.   FEN: NPO, IVF; hypokalemia and hypomagnesemia-will replace IV ID: None VTE: SCD's, lovenox Foley: none Dispo: inpatient    LOS: 4 days    [facility] Please see Amion for pager number during day hours 7:00am-4:30pm

## 2021-04-15 DIAGNOSIS — K5669 Other partial intestinal obstruction: Secondary | ICD-10-CM | POA: Diagnosis not present

## 2021-04-15 LAB — GLUCOSE, CAPILLARY
Glucose-Capillary: 89 mg/dL (ref 70–99)
Glucose-Capillary: 114 mg/dL — ABNORMAL HIGH (ref 70–99)

## 2021-04-15 MED ORDER — POLYETHYLENE GLYCOL 3350 17 G PO PACK: 17.0000 g | PACK | Freq: Every day | ORAL | 0 refills | Status: DC | PRN

## 2021-04-15 NOTE — Progress Notes (Signed)
Discharge instructions given to patient and all questions were answered.  

## 2021-04-15 NOTE — Progress Notes (Signed)
[facility] Progress Note     Subjective: CC:  Reports continued flatus/stool. Denies nausea/vomiting with PO liquids.   Objective: Vital signs in last 24 hours: Temp:  [98.4 F (36.9 C)-98.9 F (37.2 C)] 98.6 F (37 C) (12/12 0442) Pulse Rate:  [68-76] 68 (12/12 0442) Resp:  [16-18] 16 (12/12 0442) BP: (136-143)/(59-73) 143/71 (12/12 0442) SpO2:  [99 %-100 %] 99 % (12/12 0442) Last BM Date: 04/14/21  Intake/Output from previous day: 12/11 0701 - 12/12 0700 In: 2812.3 [P.O.:90; I.V.:2374.7; IV Piggyback:347.6] Out: -  Intake/Output this shift: Total I/O In: 120 [P.O.:120] Out: -   PE: Gen:  Alert, NAD, pleasant Card:  Regular rate and rhythm, pedal pulses 2+ BL Pulm:  Normal effort, clear to auscultation bilaterally Abd: Soft, no distention, nontender Skin: warm and dry, no rashes  Psych: A&Ox3   Lab Results:  Recent Labs    04/13/21 0456 04/14/21 0440  WBC 5.2 3.9*  HGB 11.6* 11.4*  HCT 36.6 35.1*  PLT 185 192   BMET Recent Labs    04/13/21 0456 04/14/21 0440  NA 140 140  K 3.7 3.4*  CL 109 108  CO2 25 24  GLUCOSE 104* 102*  BUN <5* <5*  CREATININE 0.55 0.59  CALCIUM 8.7* 8.6*   PT/INR No results for input(s): LABPROT, INR in the last 72 hours. CMP     Component Value Date/Time   NA 140 04/14/2021 0440   NA 143 04/11/2020 1148   K 3.4 (L) 04/14/2021 0440   CL 108 04/14/2021 0440   CO2 24 04/14/2021 0440   GLUCOSE 102 (H) 04/14/2021 0440   BUN <5 (L) 04/14/2021 0440   BUN 12 04/11/2020 1148   CREATININE 0.59 04/14/2021 0440   CALCIUM 8.6 (L) 04/14/2021 0440   PROT 6.2 (L) 04/11/2021 0427   PROT 7.6 04/11/2020 1148   ALBUMIN 3.3 (L) 04/11/2021 0427   ALBUMIN 4.6 04/11/2020 1148   AST 21 04/11/2021 0427   ALT 18 04/11/2021 0427   ALKPHOS 54 04/11/2021 0427   BILITOT 0.6 04/11/2021 0427   BILITOT 0.6 04/11/2020 1148   GFRNONAA >60 04/14/2021 0440   GFRAA 88 04/11/2020 1148   Lipase     Component Value Date/Time    LIPASE 25 04/10/2021 0330       Studies/Results: DG Abd Portable 1V-Small Bowel Obstruction Protocol-initial, 8 hr delay  Result Date: 04/13/2021 CLINICAL DATA:  Small-bowel obstruction EXAM: PORTABLE ABDOMEN - 1 VIEW COMPARISON:  04/12/2021 FINDINGS: 2 supine frontal views of the abdomen and pelvis are obtained. Enteric catheter projects over the gastric body. Oral contrast is seen throughout the colon through the level of the rectum. No signs of obstruction or ileus. IMPRESSION: 1. Oral contrast throughout the colon. No evidence of bowel obstruction. Electronically Signed   By: Randa Ngo M.D.   On: 04/13/2021 19:38    Anti-infectives: Anti-infectives (From admission, onward)    None        Assessment/Plan  Sbo, Possible closed-loop bowel obstruction - low suspicion for a closed loop obstruction at this point. Her vitals are stable and she has no signs or sxs of bowel ischemia.  No acidosis, leukocytosis or AKI - Clinically she has improved tremendously in the last 72 hours and plain films demonstrate resolution of the obstruction. - NG out 12/11, tolerating CLD, advance to SOFT diet - possible PM discharge pending toleration of diet without recurrent sxs     FEN: SOFT ID: None VTE: SCD's, lovenox Foley: none Dispo: inpatient  LOS: 5 days    [facility] [facility] Please see Amion for pager number during day hours 7:00am-4:30pm

## 2021-04-15 NOTE — Discharge Summary (Signed)
Patient ID: KYNEDI PROFITT [id number] [birth year] 70 y.o.  Admit date: 04/10/2021 Discharge date: 04/15/2021  Admitting Diagnosis: Possible closed-loop bowel obstruction  Discharge Diagnosis Patient Active Problem List   Diagnosis Date Noted   SBO (small bowel obstruction) ([place]) 04/10/2021   Uncontrolled type 2 diabetes mellitus with hyperglycemia ([place]) 12/26/2020   Type 2 diabetes mellitus without complication, without long-term current use of insulin ([place]) 10/17/2019   Mild intermittent asthma without complication 92/42/6834   Family history of colon cancer in mother 09/13/2018   Postmenopausal estrogen deficiency 09/13/2018   Pain of left shoulder joint on movement 05/07/2018   Trigger finger of right hand 05/22/2017   Family history of breast cancer in sister 03/09/2015   Consultants None  Procedures None  H&P (12/7)  Lindsey Bray is a 70 year old female with a past medical history of diabetes who presented to the emergency room with a chief complaint of abdominal pain.  Patient reports acute onset abdominal pain last night around 10:30 PM after she ate a sugar-free pudding.  Pain described as constant, sharp, diffuse abdominal pain.  Pain was nonradiating.  Patient reports associated vomiting and diarrhea.  States it felt sort of like when she had food poisoning in the past.  Denies a known history of bowel obstruction.  In the emergency department her pain is slightly better after IV fluids and pain medication.  She states she is having flatus. Last BM was yesterday night.  Patient denies tobacco, alcohol, or drug use.  She has a history of C-section.  Her son is at the bedside.  She denies use of blood thinning medications.   Hospital Course:  Patient presented as above. Her CT scan showed a decompressed stomach but mid to distal dilated small bowel with poorly enhancing bowel wall concerning for possible ischemic changes/closed loop. Given her vital signs were within  normal limits, her labs were without leukocytosis, and the patient had no peritonitis on exam, our team did not feel the patient required emergent surgery. She was admitted for observation, bowel rest, NGT decompression, IV fluids and to follow her abdominal exam closely.  Lactic 2.0 on admission improved to 0.8 after IVF. Labs remained reassuring. SBO protocol was initiated x 2. Patients abdominal pain improved and she had ROBF. Contrast passed into the colon on plain films and showed no dilation of small bowel. NGT was removed and diet was advanced and tolerated. On 12/12 the patient was felt stable for discharge home.   I was not directly involved in this patient's care and did not see the patient during their hospital stay, therefore the information in this discharge summary was taken entirely from the chart.   Physical Exam: Please see MD/PA's note from earlier today for   Allergies as of 04/15/2021   No Known Allergies      Medication List     TAKE these medications    ibuprofen 200 MG tablet Commonly known as: ADVIL Take 200 mg by mouth every 6 (six) hours as needed for fever, headache or mild pain.   Insulin Pen Needle 29G X 12.7MM Misc Inject 15 Units into the skin daily.   polyethylene glycol 17 g packet Commonly known as: MiraLax Take 17 g by mouth daily as needed for mild constipation.   Tyler Aas FlexTouch 100 UNIT/ML FlexTouch Pen Generic drug: insulin degludec Inject 10 Units into the skin daily. And pen needles 1/day What changed: additional instructions          Follow-up  Information     Maximiano Coss, NP. Schedule an appointment as soon as possible for a visit.   Specialty: Adult Health Nurse Practitioner Why: For post hospitilization follow up Contact information: [place] [address] [phone]                  Signed: Alferd Apa, [facility] 04/15/2021, 2:20 PM Please see Amion for pager number  during day hours 7:00am-4:30pm

## 2021-04-15 NOTE — Care Management Important Message (Signed)
Important Message  Patient Details IM Letter given to the Patient. Name: Lindsey Bray MRN: [id number] Date of Birth: 01/07/1951   Medicare Important Message Given:  Yes     Kerin Salen 04/15/2021, 2:39 PM

## 2021-04-19 ENCOUNTER — Other Ambulatory Visit: Payer: Self-pay | Admitting: Registered Nurse

## 2021-04-19 ENCOUNTER — Encounter: Payer: Self-pay | Admitting: Registered Nurse

## 2021-04-19 DIAGNOSIS — E1169 Type 2 diabetes mellitus with other specified complication: Secondary | ICD-10-CM

## 2021-04-19 MED ORDER — ROSUVASTATIN CALCIUM 10 MG PO TABS: 10.0000 mg | ORAL_TABLET | Freq: Every day | ORAL | 3 refills | Status: DC

## 2021-04-22 ENCOUNTER — Encounter: Payer: Self-pay | Admitting: Registered Nurse

## 2021-04-22 ENCOUNTER — Ambulatory Visit (INDEPENDENT_AMBULATORY_CARE_PROVIDER_SITE_OTHER): Payer: Medicare HMO | Admitting: Registered Nurse

## 2021-04-22 ENCOUNTER — Other Ambulatory Visit: Payer: Self-pay

## 2021-04-22 VITALS — BP 129/69 | HR 66 | Temp 98.2°F | Resp 18 | Ht 59.0 in | Wt 132.4 lb

## 2021-04-22 DIAGNOSIS — E876 Hypokalemia: Secondary | ICD-10-CM

## 2021-04-22 DIAGNOSIS — Z09 Encounter for follow-up examination after completed treatment for conditions other than malignant neoplasm: Secondary | ICD-10-CM

## 2021-04-22 DIAGNOSIS — H6121 Impacted cerumen, right ear: Secondary | ICD-10-CM

## 2021-04-22 DIAGNOSIS — D649 Anemia, unspecified: Secondary | ICD-10-CM

## 2021-04-22 LAB — CBC WITH DIFFERENTIAL/PLATELET
Basophils Absolute: 0 10*3/uL (ref 0.0–0.1)
Basophils Relative: 1.3 % (ref 0.0–3.0)
Eosinophils Absolute: 0.1 10*3/uL (ref 0.0–0.7)
Eosinophils Relative: 3.2 % (ref 0.0–5.0)
HCT: 39.5 % (ref 36.0–46.0)
Hemoglobin: 12.7 g/dL (ref 12.0–15.0)
Lymphocytes Relative: 50.6 % — ABNORMAL HIGH (ref 12.0–46.0)
Lymphs Abs: 1.6 10*3/uL (ref 0.7–4.0)
MCHC: 32 g/dL (ref 30.0–36.0)
MCV: 83.6 fl (ref 78.0–100.0)
Monocytes Absolute: 0.4 10*3/uL (ref 0.1–1.0)
Monocytes Relative: 12.7 % — ABNORMAL HIGH (ref 3.0–12.0)
Neutro Abs: 1 10*3/uL — ABNORMAL LOW (ref 1.4–7.7)
Neutrophils Relative %: 32.2 % — ABNORMAL LOW (ref 43.0–77.0)
Platelets: 229 10*3/uL (ref 150.0–400.0)
RBC: 4.73 Mil/uL (ref 3.87–5.11)
RDW: 13.9 % (ref 11.5–15.5)
WBC: 3.2 10*3/uL — ABNORMAL LOW (ref 4.0–10.5)

## 2021-04-22 NOTE — Progress Notes (Signed)
Established Patient Office Visit  Subjective:  Patient ID: Lindsey Bray, female    DOB: Jan 26, 1951  Age: 70 y.o. MRN: [id number]  CC:  Chief Complaint  Patient presents with   Hospitalization Follow-up    Patient states she is here for a hospital follow up for 5 days.    HPI Lindsey Bray presents for HFU  Hospitalized 04/10/21 - 04/15/21 for sbo Noted with acute onset of abdominal pain Fortunately was incomplete obstruction, resolved with observation and bowel rest Feeling much improved today. No ongoing concerns.   She is up to date on colonoscopy as of 12/22/18 - 7 year recall.  Fortunately sugars in hospital mostly stable - her A1c has dropped to 5.6 as of last check with Dr. Loanne Drilling  She was mildly hypokalemic and hypocalcemic in the hospital. Will recheck today  CBC fairly stable throughout hospitalization, most notable for no rise in WBC, but did see RBC fall to 11.6 at last check. Can recheck today.   Last imaging was DG abdomen on 04/13/21 without evidence of obstruction  She also notes cerumen impaction today. R ear muffled hearing. No drainage. No pain. No URI symptoms.  Otherwise no concerns.  Past Medical History:  Diagnosis Date   Allergy    as a child and young adult   Arthritis    Oa left shoulder   Asthma    as a child and young adult    Past Surgical History:  Procedure Laterality Date   BREAST SURGERY Right 33 (age 22)   benign breast tumors   CESAREAN SECTION     x1   COLONOSCOPY  2004   ~16 yrs ago- was normal    trigger thumb  05/2017   r hand    Family History  Problem Relation Age of Onset   Cancer Mother 50       colon- died age 41    Colon cancer Mother    COPD Father    Heart disease Father    Cancer Sister 56       breast   Breast cancer Sister    Cancer Sister 69       breast; had a lumpectomy and is on tamoxifen   Breast cancer Sister    Colon polyps Sister    Esophageal cancer Neg Hx    Rectal cancer Neg Hx     Stomach cancer Neg Hx    Diabetes Neg Hx     Social History   Socioeconomic History   Marital status: Single    Spouse name: Not on file   Number of children: 1   Years of education: Not on file   Highest education level: Not on file  Occupational History   Not on file  Tobacco Use   Smoking status: Never   Smokeless tobacco: Never  Vaping Use   Vaping Use: Never used  Substance and Sexual Activity   Alcohol use: No    Alcohol/week: 0.0 standard drinks   Drug use: Never   Sexual activity: Not on file  Other Topics Concern   Not on file  Social History Narrative   Lives by herself in [place]; has a 55 yo son, who is married. No grandchildren.   Social Determinants of Health   Financial Resource Strain: Not on file  Food Insecurity: Not on file  Transportation Needs: Not on file  Physical Activity: Not on file  Stress: Not on file  Social Connections: Not on file  Intimate  Partner Violence: Not on file    Outpatient Medications Prior to Visit  Medication Sig Dispense Refill   ibuprofen (ADVIL) 200 MG tablet Take 200 mg by mouth every 6 (six) hours as needed for fever, headache or mild pain.     insulin degludec (TRESIBA FLEXTOUCH) 100 UNIT/ML FlexTouch Pen Inject 10 Units into the skin daily. And pen needles 1/day (Patient taking differently: Inject 10 Units into the skin daily.) 15 mL 3   Insulin Pen Needle 29G X 12.7MM MISC Inject 15 Units into the skin daily. 100 each 5   polyethylene glycol (MIRALAX) 17 g packet Take 17 g by mouth daily as needed for mild constipation. 14 each 0   rosuvastatin (CRESTOR) 10 MG tablet Take 1 tablet (10 mg total) by mouth daily. 90 tablet 3   No facility-administered medications prior to visit.    No Known Allergies  ROS Review of Systems  Constitutional: Negative.   HENT:  Positive for hearing loss (r side).   Eyes: Negative.   Respiratory: Negative.    Cardiovascular: Negative.   Gastrointestinal: Negative.    Endocrine: Negative.   Genitourinary: Negative.   Musculoskeletal: Negative.   Skin: Negative.   Allergic/Immunologic: Negative.   Neurological: Negative.   Hematological: Negative.   Psychiatric/Behavioral: Negative.    All other systems reviewed and are negative.    Objective:    Physical Exam Vitals and nursing note reviewed.  Constitutional:      General: She is not in acute distress.    Appearance: Normal appearance. She is normal weight. She is not ill-appearing, toxic-appearing or diaphoretic.  HENT:     Right Ear: Tympanic membrane, ear canal and external ear normal. There is impacted cerumen.     Left Ear: Tympanic membrane, ear canal and external ear normal. There is no impacted cerumen.  Cardiovascular:     Rate and Rhythm: Normal rate and regular rhythm.     Heart sounds: Normal heart sounds. No murmur heard.   No friction rub. No gallop.  Pulmonary:     Effort: Pulmonary effort is normal. No respiratory distress.     Breath sounds: Normal breath sounds. No stridor. No wheezing, rhonchi or rales.  Chest:     Chest wall: No tenderness.  Skin:    General: Skin is warm and dry.  Neurological:     General: No focal deficit present.     Mental Status: She is alert and oriented to person, place, and time. Mental status is at baseline.  Psychiatric:        Mood and Affect: Mood normal.        Behavior: Behavior normal.        Thought Content: Thought content normal.        Judgment: Judgment normal.    BP 129/69    Pulse 66    Temp 98.2 F (36.8 C) (Temporal)    Resp 18    Ht 4\' 11"  (1.499 m)    Wt 132 lb 6.4 oz (60.1 kg)    SpO2 97%    BMI 26.74 kg/m  Wt Readings from Last 3 Encounters:  04/22/21 132 lb 6.4 oz (60.1 kg)  04/10/21 133 lb 6.1 oz (60.5 kg)  04/08/21 133 lb 6.4 oz (60.5 kg)     Health Maintenance Due  Topic Date Due   URINE MICROALBUMIN  Never done   Hepatitis C Screening  Never done   COVID-19 Vaccine (5 - Booster for [place] series)  03/12/2021  There are no preventive care reminders to display for this patient.  Lab Results  Component Value Date   TSH 2.44 11/06/2020   Lab Results  Component Value Date   WBC 3.9 (L) 04/14/2021   HGB 11.4 (L) 04/14/2021   HCT 35.1 (L) 04/14/2021   MCV 83.2 04/14/2021   PLT 192 04/14/2021   Lab Results  Component Value Date   NA 140 04/14/2021   K 3.4 (L) 04/14/2021   CO2 24 04/14/2021   GLUCOSE 102 (H) 04/14/2021   BUN <5 (L) 04/14/2021   CREATININE 0.59 04/14/2021   BILITOT 0.6 04/11/2021   ALKPHOS 54 04/11/2021   AST 21 04/11/2021   ALT 18 04/11/2021   PROT 6.2 (L) 04/11/2021   ALBUMIN 3.3 (L) 04/11/2021   CALCIUM 8.6 (L) 04/14/2021   ANIONGAP 8 04/14/2021   GFR 71.04 11/06/2020   Lab Results  Component Value Date   CHOL 207 (H) 04/11/2020   Lab Results  Component Value Date   HDL 60 04/11/2020   Lab Results  Component Value Date   LDLCALC 130 (H) 04/11/2020   Lab Results  Component Value Date   TRIG 94 04/11/2020   Lab Results  Component Value Date   CHOLHDL 3.5 04/11/2020   Lab Results  Component Value Date   HGBA1C 5.6 04/08/2021      Assessment & Plan:   Problem List Items Addressed This Visit   None Visit Diagnoses     Hospital discharge follow-up    -  Primary   Relevant Orders   Comprehensive metabolic panel   CBC with Differential/Platelet   Impacted cerumen of right ear       Hypokalemia       Relevant Orders   Comprehensive metabolic panel   Low hemoglobin       Relevant Orders   CBC with Differential/Platelet       No orders of the defined types were placed in this encounter.   Follow-up: Return if symptoms worsen or fail to improve.   PLAN Pt clinically doing well since discharge. Continue home MiraLax and high fiber diet with aggressive hydration. Recheck cmp and cbc today  Ear lavage successful by Jerene Pitch CMA Return in 2023 Patient encouraged to call clinic with any questions, comments, or  concerns.  Maximiano Coss, NP

## 2021-04-22 NOTE — Patient Instructions (Addendum)
Lindsey Bray -   Jim Desanctis you're doing well!  Keep up the great work on the diabetes.  I'll let you know if labs are worrisome  Let me know if the ear gives any ongoing issues.  Thanks,  Rich     If you have lab work done today you will be contacted with your lab results within the next 2 weeks.  If you have not heard from [place] then please contact us. The fastest way to get your results is to register for My Chart.   IF you received an x-ray today, you will receive an invoice from [facility]. Please contact [facility] at [phone] with questions or concerns regarding your invoice.   IF you received labwork today, you will receive an invoice from [facility]. Please contact [facility] at [phone] with questions or concerns regarding your invoice.   Our billing staff will not be able to assist you with questions regarding bills from these companies.  You will be contacted with the lab results as soon as they are available. The fastest way to get your results is to activate your My Chart account. Instructions are located on the last page of this paperwork. If you have not heard from [place] regarding the results in 2 weeks, please contact this office.

## 2021-04-23 ENCOUNTER — Encounter: Payer: Self-pay | Admitting: Registered Nurse

## 2021-04-23 LAB — COMPREHENSIVE METABOLIC PANEL
ALT: 26 U/L (ref 0–35)
AST: 23 U/L (ref 0–37)
Albumin: 4.3 g/dL (ref 3.5–5.2)
Alkaline Phosphatase: 72 U/L (ref 39–117)
BUN: 15 mg/dL (ref 6–23)
CO2: 24 mEq/L (ref 19–32)
Calcium: 10.2 mg/dL (ref 8.4–10.5)
Chloride: 104 mEq/L (ref 96–112)
Creatinine, Ser: 0.74 mg/dL (ref 0.40–1.20)
GFR: 82.44 mL/min (ref >60.00)
Glucose, Bld: 67 mg/dL — ABNORMAL LOW (ref 70–99)
Potassium: 4.1 mEq/L (ref 3.5–5.1)
Sodium: 143 mEq/L (ref 135–145)
Total Bilirubin: 0.4 mg/dL (ref 0.2–1.2)
Total Protein: 7.7 g/dL (ref 6.0–8.3)

## 2021-04-30 ENCOUNTER — Other Ambulatory Visit: Payer: Self-pay | Admitting: Registered Nurse

## 2021-04-30 DIAGNOSIS — E1169 Type 2 diabetes mellitus with other specified complication: Secondary | ICD-10-CM

## 2021-04-30 MED ORDER — ROSUVASTATIN CALCIUM 10 MG PO TABS: 10.0000 mg | ORAL_TABLET | Freq: Every day | ORAL | 3 refills | Status: DC

## 2021-05-19 LAB — CBC
MCH: 29.4 pg (ref 26.0–34.0)
MCHC: 32.4 g/dL (ref 30.0–36.0)

## 2021-05-22 ENCOUNTER — Ambulatory Visit (INDEPENDENT_AMBULATORY_CARE_PROVIDER_SITE_OTHER): Payer: HMO | Admitting: Endocrinology

## 2021-05-22 ENCOUNTER — Other Ambulatory Visit: Payer: Self-pay

## 2021-05-22 VITALS — BP 118/64 | HR 79 | Ht 59.0 in | Wt 136.8 lb

## 2021-05-22 DIAGNOSIS — E119 Type 2 diabetes mellitus without complications: Secondary | ICD-10-CM | POA: Diagnosis not present

## 2021-05-22 LAB — POCT GLYCOSYLATED HEMOGLOBIN (HGB A1C): Hemoglobin A1C: 5.5 % (ref 4.0–5.6)

## 2021-05-22 MED ORDER — DAPAGLIFLOZIN PROPANEDIOL 10 MG PO TABS: 10.0000 mg | ORAL_TABLET | Freq: Every day | ORAL | 3 refills | Status: DC

## 2021-05-22 NOTE — Patient Instructions (Addendum)
check your blood sugar once a day.  vary the time of day when you check, between before the 3 meals, and at bedtime.  also check if you have symptoms of your blood sugar being too high or too low.  please keep a record of the readings and bring it to your next appointment here (or you can bring the meter itself).  You can write it on any piece of paper.  please call us sooner if your blood sugar goes below 70, or if most of your readings are over 200.  I have sent a prescription to your pharmacy, to change the [place] to [place].  Please come back for a follow-up appointment in 2 months.

## 2021-05-22 NOTE — Progress Notes (Signed)
Subjective:    Patient ID: Lindsey Bray, female    DOB: 1950-06-07, 71 y.o.   MRN: [id number]  HPI Pt returns for f/u of diabetes mellitus:  DM type: 2 Dx'ed: 0175 Complications: none Therapy: insulin since mid-2020 GDM: never DKA: never Severe hypoglycemia: never Pancreatitis: never Pancreatic imaging: normal on 2022 CT SDOH: none Other: She did not tolerate metformin (not-XR; abd pain) or Rybelsus (headache)    She may be manageable off insulin.   Interval history: she brings her meter with her cbg's which I have reviewed today.  cbg varies from 100-112.  She was in the hosp with SBO.   Past Medical History:  Diagnosis Date   Allergy    as a child and young adult   Arthritis    Oa left shoulder   Asthma    as a child and young adult    Past Surgical History:  Procedure Laterality Date   BREAST SURGERY Right 77 (age 85)   benign breast tumors   CESAREAN SECTION     x1   COLONOSCOPY  2004   ~16 yrs ago- was normal    trigger thumb  05/2017   r hand    Social History   Socioeconomic History   Marital status: Single    Spouse name: Not on file   Number of children: 1   Years of education: Not on file   Highest education level: Not on file  Occupational History   Not on file  Tobacco Use   Smoking status: Never   Smokeless tobacco: Never  Vaping Use   Vaping Use: Never used  Substance and Sexual Activity   Alcohol use: No    Alcohol/week: 0.0 standard drinks   Drug use: Never   Sexual activity: Not on file  Other Topics Concern   Not on file  Social History Narrative   Lives by herself in [place]; has a 70 yo son, who is married. No grandchildren.   Social Determinants of Health   Financial Resource Strain: Not on file  Food Insecurity: Not on file  Transportation Needs: Not on file  Physical Activity: Not on file  Stress: Not on file  Social Connections: Not on file  Intimate Partner Violence: Not on file    Current Outpatient  Medications on File Prior to Visit  Medication Sig Dispense Refill   ibuprofen (ADVIL) 200 MG tablet Take 200 mg by mouth every 6 (six) hours as needed for fever, headache or mild pain.     Insulin Pen Needle 29G X 12.7MM MISC Inject 15 Units into the skin daily. 100 each 5   polyethylene glycol (MIRALAX) 17 g packet Take 17 g by mouth daily as needed for mild constipation. 14 each 0   rosuvastatin (CRESTOR) 10 MG tablet Take 1 tablet (10 mg total) by mouth daily. 90 tablet 3   No current facility-administered medications on file prior to visit.    No Known Allergies  Family History  Problem Relation Age of Onset   Cancer Mother 26       colon- died age 39    Colon cancer Mother    COPD Father    Heart disease Father    Cancer Sister 72       breast   Breast cancer Sister    Cancer Sister 80       breast; had a lumpectomy and is on tamoxifen   Breast cancer Sister    Colon polyps Sister  Esophageal cancer Neg Hx    Rectal cancer Neg Hx    Stomach cancer Neg Hx    Diabetes Neg Hx     BP 118/64    Pulse 79    Ht 4\' 11"  (1.499 m)    Wt 136 lb 12.8 oz (62.1 kg)    SpO2 98%    BMI 27.63 kg/m    Review of Systems     Objective:   Physical Exam    Lab Results  Component Value Date   HGBA1C 5.5 05/22/2021   Lab Results  Component Value Date   CREATININE 0.74 04/22/2021   BUN 15 04/22/2021   NA 143 04/22/2021   K 4.1 04/22/2021   CL 104 04/22/2021   CO2 24 04/22/2021       Assessment & Plan:  Type 2 DM: overcontrolled.  She can d/c insulin.   Patient Instructions  check your blood sugar once a day.  vary the time of day when you check, between before the 3 meals, and at bedtime.  also check if you have symptoms of your blood sugar being too high or too low.  please keep a record of the readings and bring it to your next appointment here (or you can bring the meter itself).  You can write it on any piece of paper.  please call us sooner if your blood sugar goes  below 70, or if most of your readings are over 200.  I have sent a prescription to your pharmacy, to change the [place] to [place].  Please come back for a follow-up appointment in 2 months.

## 2021-05-30 ENCOUNTER — Telehealth: Payer: Self-pay | Admitting: Endocrinology

## 2021-05-30 NOTE — Telephone Encounter (Signed)
Caryl Pina is calling to confirm if pt has diabetes and/or chronic heart failure this information is needed before 06/03/2021 so that the pt is able to continue in the program.  [facility] representative would like to have a call back.

## 2021-05-31 NOTE — Telephone Encounter (Signed)
Spoke with Haliey from [employer] and let them know that the pt does has DM.

## 2021-06-11 ENCOUNTER — Telehealth: Payer: Self-pay

## 2021-06-11 NOTE — Telephone Encounter (Signed)
LVM for [employer] from [place] [facility] regarding pt condition that Loanne Drilling treats her for. I let her know that Loanne Drilling treats her for Diabetes.

## 2021-06-22 NOTE — Progress Notes (Signed)
Established Patient Office Visit  Subjective:  Patient ID: Lindsey Bray, female    DOB: [birth year]  Age: 71 y.o. MRN: [id number]  CC:  Chief Complaint  Patient presents with   Headache    Patient states she has had an headache for the last two weeks that seems to keep coming and going after taking ibuprofen.patient states that even wearing glasses her vision is starting to look different.    HPI Lindsey Bray presents for headache  Ongoing. Unilateral Photophobia No nvd, but some mild blurring vision  Up to date on eye exams Taking ibuprofen with some relief, but incomplete No recent injury or trauma No weakness, numbness, tingling, slurred speech, drooping face.   Past Medical History:  Diagnosis Date   Allergy    as a child and young adult   Arthritis    Oa left shoulder   Asthma    as a child and young adult    Past Surgical History:  Procedure Laterality Date   BREAST SURGERY Right 50 (age 33)   benign breast tumors   CESAREAN SECTION     x1   COLONOSCOPY  2004   ~16 yrs ago- was normal    trigger thumb  05/2017   r hand    Family History  Problem Relation Age of Onset   Cancer Mother 12       colon- died age 68    Colon cancer Mother    COPD Father    Heart disease Father    Cancer Sister 73       breast   Breast cancer Sister    Cancer Sister 1       breast; had a lumpectomy and is on tamoxifen   Breast cancer Sister    Colon polyps Sister    Esophageal cancer Neg Hx    Rectal cancer Neg Hx    Stomach cancer Neg Hx    Diabetes Neg Hx     Social History   Socioeconomic History   Marital status: Single    Spouse name: Not on file   Number of children: 1   Years of education: Not on file   Highest education level: Not on file  Occupational History   Not on file  Tobacco Use   Smoking status: Never   Smokeless tobacco: Never  Vaping Use   Vaping Use: Never used  Substance and Sexual Activity   Alcohol use: No     Alcohol/week: 0.0 standard drinks   Drug use: Never   Sexual activity: Not on file  Other Topics Concern   Not on file  Social History Narrative   Lives by herself in [place]; has a 47 yo son, who is married. No grandchildren.   Social Determinants of Health   Financial Resource Strain: Not on file  Food Insecurity: Not on file  Transportation Needs: Not on file  Physical Activity: Not on file  Stress: Not on file  Social Connections: Not on file  Intimate Partner Violence: Not on file    Outpatient Medications Prior to Visit  Medication Sig Dispense Refill   albuterol (VENTOLIN HFA) 108 (90 Base) MCG/ACT inhaler Inhale 2 puffs into the lungs every 4 (four) hours as needed for wheezing or shortness of breath (cough, shortness of breath or wheezing.). 1 Inhaler 1   pantoprazole (PROTONIX) 40 MG tablet Take 1 tablet (40 mg total) by mouth daily. 30 tablet 3   polyethylene glycol powder (GLYCOLAX/MIRALAX) 17 GM/SCOOP powder  Take 17 g by mouth 2 (two) times daily as needed. 3350 g 1   ibuprofen (ADVIL) 200 MG tablet Take 200 mg by mouth every 6 (six) hours as needed for fever, headache or mild pain.     diclofenac sodium (VOLTAREN) 1 % GEL APP 4 GRAMS AA EXT QID     metFORMIN (GLUCOPHAGE) 500 MG tablet Take 1 tablet (500 mg total) by mouth 2 (two) times daily with a meal. (Patient not taking: No sig reported) 180 tablet 3   No facility-administered medications prior to visit.    No Known Allergies  ROS Review of Systems Per hpi    Objective:    Physical Exam Vitals and nursing note reviewed.  Constitutional:      General: She is not in acute distress.    Appearance: Normal appearance. She is normal weight. She is not ill-appearing, toxic-appearing or diaphoretic.  Eyes:     General: No visual field deficit. Cardiovascular:     Rate and Rhythm: Normal rate and regular rhythm.     Heart sounds: Normal heart sounds. No murmur heard.   No friction rub. No gallop.   Pulmonary:     Effort: Pulmonary effort is normal. No respiratory distress.     Breath sounds: Normal breath sounds. No stridor. No wheezing, rhonchi or rales.  Chest:     Chest wall: No tenderness.  Skin:    General: Skin is warm and dry.  Neurological:     General: No focal deficit present.     Mental Status: She is alert and oriented to person, place, and time. Mental status is at baseline.     GCS: GCS eye subscore is 4. GCS verbal subscore is 5. GCS motor subscore is 6.     Cranial Nerves: No cranial nerve deficit, dysarthria or facial asymmetry.     Sensory: No sensory deficit.     Motor: No weakness.     Coordination: Romberg sign negative. Coordination normal.     Gait: Gait normal.  Psychiatric:        Mood and Affect: Mood normal.        Speech: Speech normal.        Behavior: Behavior normal.        Thought Content: Thought content normal.        Judgment: Judgment normal.    There were no vitals taken for this visit. Wt Readings from Last 3 Encounters:  05/22/21 136 lb 12.8 oz (62.1 kg)  04/22/21 132 lb 6.4 oz (60.1 kg)  04/10/21 133 lb 6.1 oz (60.5 kg)     Health Maintenance Due  Topic Date Due   URINE MICROALBUMIN  Never done   Hepatitis C Screening  Never done   COVID-19 Vaccine (5 - Booster for Pfizer series) 03/12/2021    There are no preventive care reminders to display for this patient.  Lab Results  Component Value Date   TSH 2.44 11/06/2020   Lab Results  Component Value Date   WBC 3.2 (L) 04/22/2021   HGB 12.7 04/22/2021   HCT 39.5 04/22/2021   MCV 83.6 04/22/2021   PLT 229.0 04/22/2021   Lab Results  Component Value Date   NA 143 04/22/2021   K 4.1 04/22/2021   CO2 24 04/22/2021   GLUCOSE 67 (L) 04/22/2021   BUN 15 04/22/2021   CREATININE 0.74 04/22/2021   BILITOT 0.4 04/22/2021   ALKPHOS 72 04/22/2021   AST 23 04/22/2021   ALT 26 04/22/2021  PROT 7.7 04/22/2021   ALBUMIN 4.3 04/22/2021   CALCIUM 10.2 04/22/2021   ANIONGAP  8 04/14/2021   GFR 82.44 04/22/2021   Lab Results  Component Value Date   CHOL 207 (H) 04/11/2020   Lab Results  Component Value Date   HDL 60 04/11/2020   Lab Results  Component Value Date   LDLCALC 130 (H) 04/11/2020   Lab Results  Component Value Date   TRIG 94 04/11/2020   Lab Results  Component Value Date   CHOLHDL 3.5 04/11/2020   Lab Results  Component Value Date   HGBA1C 5.5 05/22/2021      Assessment & Plan:   Problem List Items Addressed This Visit   None Visit Diagnoses     Nonintractable migraine, unspecified migraine type    -  Primary       Meds ordered this encounter  Medications   DISCONTD: SUMAtriptan-naproxen (TREXIMET) 85-500 MG tablet    Sig: Take 1 tablet by mouth every 2 (two) hours as needed for migraine. Do not exceed two doses in 1 day.    Dispense:  10 tablet    Refill:  0    Order Specific Question:   Supervising Provider    Answer:   Carlota Raspberry, JEFFREY R [[id number]]    Follow-up: No follow-ups on file.   PLAN Neuro exam reassuring.  Start sumatriptan-naproxen 1 tab at headache onset, repeat in 2 hours prn.  No need for imaging at this time.  Return if worsening or failing to improve Patient encouraged to call clinic with any questions, comments, or concerns.  Maximiano Coss, NP

## 2021-07-25 ENCOUNTER — Ambulatory Visit (INDEPENDENT_AMBULATORY_CARE_PROVIDER_SITE_OTHER): Payer: HMO | Admitting: Endocrinology

## 2021-07-25 ENCOUNTER — Other Ambulatory Visit: Payer: Self-pay

## 2021-07-25 VITALS — BP 112/66 | HR 73 | Ht 59.0 in | Wt 138.2 lb

## 2021-07-25 DIAGNOSIS — E119 Type 2 diabetes mellitus without complications: Secondary | ICD-10-CM

## 2021-07-25 LAB — POCT GLYCOSYLATED HEMOGLOBIN (HGB A1C): Hemoglobin A1C: 5.7 % — AB (ref 4.0–5.6)

## 2021-07-25 NOTE — Patient Instructions (Addendum)
check your blood sugar once a day.  vary the time of day when you check, between before the 3 meals, and at bedtime.  also check if you have symptoms of your blood sugar being too high or too low.  please keep a record of the readings and bring it to your next appointment here (or you can bring the meter itself).  You can write it on any piece of paper.  please call us sooner if your blood sugar goes below 70, or if most of your readings are over 200.  ?Please continue the same [place].  ?Please come back for a follow-up appointment in 4-6 months.    ? ? ? ?

## 2021-07-25 NOTE — Progress Notes (Signed)
? ?Subjective:  ? ? Patient ID: Lindsey Bray, female    DOB: 1950/11/18, 71 y.o.   MRN: [id number] ? ?HPI ?Pt returns for f/u of diabetes mellitus:  ?DM type: 2 ?Dx'ed: 2021 ?Complications: none ?Therapy: Wilder Glade.   ?GDM: never ?DKA: never ?Severe hypoglycemia: never ?Pancreatitis: never ?Pancreatic imaging: normal on 2022 CT.   ?SDOH: none ?Other: She did not tolerate metformin (not-XR; abd pain) or Rybelsus (headache).  She may be manageable off insulin 2020-2023.   ?Interval history: pt says cbg's are well-controlled.  She takes farxiga as rx'ed ?Past Medical History:  ?Diagnosis Date  ? Allergy   ? as a child and young adult  ? Arthritis   ? Oa left shoulder  ? Asthma   ? as a child and young adult  ? ? ?Past Surgical History:  ?Procedure Laterality Date  ? BREAST SURGERY Right 70 (age 18)  ? benign breast tumors  ? CESAREAN SECTION    ? x1  ? COLONOSCOPY  2004  ? ~16 yrs ago- was normal   ? trigger thumb  05/2017  ? r hand  ? ? ?Social History  ? ?Socioeconomic History  ? Marital status: Single  ?  Spouse name: Not on file  ? Number of children: 1  ? Years of education: Not on file  ? Highest education level: Not on file  ?Occupational History  ? Not on file  ?Tobacco Use  ? Smoking status: Never  ? Smokeless tobacco: Never  ?Vaping Use  ? Vaping Use: Never used  ?Substance and Sexual Activity  ? Alcohol use: No  ?  Alcohol/week: 0.0 standard drinks  ? Drug use: Never  ? Sexual activity: Not on file  ?Other Topics Concern  ? Not on file  ?Social History Narrative  ? Lives by herself in [place]; has a 24 yo son, who is married. No grandchildren.  ? ?Social Determinants of Health  ? ?Financial Resource Strain: Not on file  ?Food Insecurity: Not on file  ?Transportation Needs: Not on file  ?Physical Activity: Not on file  ?Stress: Not on file  ?Social Connections: Not on file  ?Intimate Partner Violence: Not on file  ? ? ?Current Outpatient Medications on File Prior to Visit  ?Medication Sig Dispense Refill   ? dapagliflozin propanediol (FARXIGA) 10 MG TABS tablet Take 1 tablet (10 mg total) by mouth daily before breakfast. 90 tablet 3  ? ibuprofen (ADVIL) 200 MG tablet Take 200 mg by mouth every 6 (six) hours as needed for fever, headache or mild pain.    ? Insulin Pen Needle 29G X 12.7MM MISC Inject 15 Units into the skin daily. 100 each 5  ? polyethylene glycol (MIRALAX) 17 g packet Take 17 g by mouth daily as needed for mild constipation. 14 each 0  ? rosuvastatin (CRESTOR) 10 MG tablet Take 1 tablet (10 mg total) by mouth daily. 90 tablet 3  ? ?No current facility-administered medications on file prior to visit.  ? ? ?No Known Allergies ? ?Family History  ?Problem Relation Age of Onset  ? Cancer Mother 43  ?     colon- died age 20   ? Colon cancer Mother   ? COPD Father   ? Heart disease Father   ? Cancer Sister 44  ?     breast  ? Breast cancer Sister   ? Cancer Sister 33  ?     breast; had a lumpectomy and is on tamoxifen  ? Breast cancer  Sister   ? Colon polyps Sister   ? Esophageal cancer Neg Hx   ? Rectal cancer Neg Hx   ? Stomach cancer Neg Hx   ? Diabetes Neg Hx   ? ? ?BP 112/66 (BP Location: Left Arm, Patient Position: Sitting, Cuff Size: Normal)   Pulse 73   Ht '4\' 11"'$  (1.499 m)   Wt 138 lb 3.2 oz (62.7 kg)   SpO2 99%   BMI 27.91 kg/m?  ? ? ?Review of Systems ? ?   ?Objective:  ? Physical Exam ?VITAL SIGNS:  See vs page.  ?GENERAL: no distress.   ? ? ?Lab Results  ?Component Value Date  ? HGBA1C 5.7 (A) 07/25/2021  ? ?Lab Results  ?Component Value Date  ? CREATININE 0.74 04/22/2021  ? BUN 15 04/22/2021  ? NA 143 04/22/2021  ? K 4.1 04/22/2021  ? CL 104 04/22/2021  ? CO2 24 04/22/2021  ? ?   ?Assessment & Plan:  ?Type 2 DM: well-controlled ? ?Patient Instructions  ?check your blood sugar once a day.  vary the time of day when you check, between before the 3 meals, and at bedtime.  also check if you have symptoms of your blood sugar being too high or too low.  please keep a record of the readings and  bring it to your next appointment here (or you can bring the meter itself).  You can write it on any piece of paper.  please call us sooner if your blood sugar goes below 70, or if most of your readings are over 200.  ?Please continue the same [place].  ?Please come back for a follow-up appointment in 4-6 months.    ? ? ? ? ? ?

## 2021-12-05 ENCOUNTER — Other Ambulatory Visit: Payer: Self-pay

## 2021-12-05 ENCOUNTER — Encounter: Payer: Self-pay | Admitting: Registered Nurse

## 2021-12-05 ENCOUNTER — Other Ambulatory Visit: Payer: Self-pay | Admitting: Registered Nurse

## 2021-12-05 ENCOUNTER — Ambulatory Visit (INDEPENDENT_AMBULATORY_CARE_PROVIDER_SITE_OTHER): Payer: PPO | Admitting: Registered Nurse

## 2021-12-05 VITALS — BP 128/68 | HR 76 | Temp 98.2°F | Resp 18 | Ht 59.0 in | Wt 144.4 lb

## 2021-12-05 DIAGNOSIS — Z0000 Encounter for general adult medical examination without abnormal findings: Secondary | ICD-10-CM | POA: Diagnosis not present

## 2021-12-05 DIAGNOSIS — E1169 Type 2 diabetes mellitus with other specified complication: Secondary | ICD-10-CM

## 2021-12-05 DIAGNOSIS — E785 Hyperlipidemia, unspecified: Secondary | ICD-10-CM

## 2021-12-05 DIAGNOSIS — D72819 Decreased white blood cell count, unspecified: Secondary | ICD-10-CM

## 2021-12-05 LAB — CBC WITH DIFFERENTIAL/PLATELET
Basophils Absolute: 0 10*3/uL (ref 0.0–0.1)
Basophils Relative: 1.2 % (ref 0.0–3.0)
Eosinophils Absolute: 0.1 10*3/uL (ref 0.0–0.7)
Eosinophils Relative: 2.7 % (ref 0.0–5.0)
HCT: 45.8 % (ref 36.0–46.0)
Hemoglobin: 14.7 g/dL (ref 12.0–15.0)
Lymphocytes Relative: 43.2 % (ref 12.0–46.0)
Lymphs Abs: 1.1 10*3/uL (ref 0.7–4.0)
MCHC: 32.2 g/dL (ref 30.0–36.0)
MCV: 82.5 fl (ref 78.0–100.0)
Monocytes Absolute: 0.4 10*3/uL (ref 0.1–1.0)
Monocytes Relative: 13.5 % — ABNORMAL HIGH (ref 3.0–12.0)
Neutro Abs: 1 10*3/uL — ABNORMAL LOW (ref 1.4–7.7)
Neutrophils Relative %: 39.4 % — ABNORMAL LOW (ref 43.0–77.0)
Platelets: 188 10*3/uL (ref 150.0–400.0)
RBC: 5.55 Mil/uL — ABNORMAL HIGH (ref 3.87–5.11)
RDW: 15.3 % (ref 11.5–15.5)
WBC: 2.6 10*3/uL — ABNORMAL LOW (ref 4.0–10.5)

## 2021-12-05 LAB — LIPID PANEL
Cholesterol: 188 mg/dL (ref 0–200)
HDL: 57.5 mg/dL (ref >39.00)
LDL Cholesterol: 114 mg/dL — ABNORMAL HIGH (ref 0–99)
NonHDL: 130.08
Total CHOL/HDL Ratio: 3
Triglycerides: 80 mg/dL (ref 0.0–149.0)
VLDL: 16 mg/dL (ref 0.0–40.0)

## 2021-12-05 LAB — COMPREHENSIVE METABOLIC PANEL
ALT: 20 U/L (ref 0–35)
AST: 25 U/L (ref 0–37)
Albumin: 4.4 g/dL (ref 3.5–5.2)
Alkaline Phosphatase: 70 U/L (ref 39–117)
BUN: 13 mg/dL (ref 6–23)
CO2: 26 mEq/L (ref 19–32)
Calcium: 9.9 mg/dL (ref 8.4–10.5)
Chloride: 106 mEq/L (ref 96–112)
Creatinine, Ser: 0.79 mg/dL (ref 0.40–1.20)
GFR: 75.89 mL/min (ref >60.00)
Glucose, Bld: 105 mg/dL — ABNORMAL HIGH (ref 70–99)
Potassium: 4.6 mEq/L (ref 3.5–5.1)
Sodium: 140 mEq/L (ref 135–145)
Total Bilirubin: 0.5 mg/dL (ref 0.2–1.2)
Total Protein: 7.8 g/dL (ref 6.0–8.3)

## 2021-12-05 LAB — MICROALBUMIN / CREATININE URINE RATIO
Creatinine,U: 137.1 mg/dL
Microalb Creat Ratio: 0.7 mg/g (ref 0.0–30.0)
Microalb, Ur: 1 mg/dL (ref 0.0–1.9)

## 2021-12-05 LAB — B12 AND FOLATE PANEL
Folate: 10.3 ng/mL (ref >5.9)
Vitamin B-12: 524 pg/mL (ref 211–911)

## 2021-12-05 LAB — HEMOGLOBIN A1C: Hgb A1c MFr Bld: 6.6 % — ABNORMAL HIGH (ref 4.6–6.5)

## 2021-12-05 LAB — TSH: TSH: 1.89 u[IU]/mL (ref 0.35–5.50)

## 2021-12-05 MED ORDER — ROSUVASTATIN CALCIUM 10 MG PO TABS: 10.0000 mg | ORAL_TABLET | Freq: Every day | ORAL | 3 refills | Status: DC

## 2021-12-05 MED ORDER — DAPAGLIFLOZIN PROPANEDIOL 10 MG PO TABS: 10.0000 mg | ORAL_TABLET | Freq: Every day | ORAL | 3 refills | Status: DC

## 2021-12-05 NOTE — Progress Notes (Signed)
Complete physical exam  Patient: Lindsey Bray   DOB: 1951/04/02   71 y.o. Female  MRN: [id number] Visit Date: 12/05/2021  Subjective:    Chief Complaint  Patient presents with   Annual Exam    Patient states she is here for the CPE.     Lindsey Bray is a 71 y.o. female who presents today for a complete physical exam. She reports consuming a general diet. Gym/ health club routine includes  . She generally feels well. She reports sleeping well. She does not have additional problems to discuss today.   Vision:Within the last year Dental:Within Last 6 months STD Screen:No PSA:No Most recent fall risk assessment:    12/05/2021    8:07 AM  Orient in the past year? 0  Number falls in past yr: 0  Injury with Fall? 0  Risk for fall due to : No Fall Risks  Follow up Falls evaluation completed     Most recent depression screenings:    12/05/2021    8:07 AM 12/26/2020    8:45 AM  PHQ 2/9 Scores  PHQ - 2 Score 0 0  PHQ- 9 Score 0 0     Patient Active Problem List   Diagnosis Date Noted   SBO (small bowel obstruction) ([place]) 04/10/2021   Uncontrolled type 2 diabetes mellitus with hyperglycemia ([place]) 12/26/2020   Type 2 diabetes mellitus without complication, without long-term current use of insulin ([place]) 10/17/2019   Mild intermittent asthma without complication 62/95/2841   Family history of colon cancer in mother 09/13/2018   Postmenopausal estrogen deficiency 09/13/2018   Pain of left shoulder joint on movement 05/07/2018   Trigger finger of right hand 05/22/2017   Family history of breast cancer in sister 03/09/2015   Past Medical History:  Diagnosis Date   Allergy    as a child and young adult   Arthritis    Oa left shoulder   Asthma    as a child and young adult   Past Surgical History:  Procedure Laterality Date   BREAST SURGERY Right 71 (age 42)   benign breast tumors   CESAREAN SECTION     x1   COLONOSCOPY  2004   ~16 yrs ago- was normal     trigger thumb  05/2017   r hand   Social History   Tobacco Use   Smoking status: Never   Smokeless tobacco: Never  Vaping Use   Vaping Use: Never used  Substance Use Topics   Alcohol use: No    Alcohol/week: 0.0 standard drinks of alcohol   Drug use: Never   Social History   Socioeconomic History   Marital status: Single    Spouse name: Not on file   Number of children: 1   Years of education: Not on file   Highest education level: Not on file  Occupational History   Not on file  Tobacco Use   Smoking status: Never   Smokeless tobacco: Never  Vaping Use   Vaping Use: Never used  Substance and Sexual Activity   Alcohol use: No    Alcohol/week: 0.0 standard drinks of alcohol   Drug use: Never   Sexual activity: Not on file  Other Topics Concern   Not on file  Social History Narrative   Lives by herself in [place]; has a 66 yo son, who is married. No grandchildren.   Social Determinants of Health   Financial Resource Strain: Not on file  Food Insecurity: Not on file  Transportation Needs: Not on file  Physical Activity: Not on file  Stress: Not on file  Social Connections: Not on file  Intimate Partner Violence: Not on file   Family Status  Relation Name Status   Mother Posey Pronto Deceased at age 14   Father Jeneen Rinks Deceased at age 45       died of MI   Sister Neoma Laming Deceased at age 30       twin   Sister Diane Alive   Sister  Alive   Son  Alive   Neg Hx  (Not Specified)   Family History  Problem Relation Age of Onset   Cancer Mother 79       colon- died age 57    Colon cancer Mother    COPD Father    Heart disease Father    Cancer Sister 64       breast   Breast cancer Sister    Cancer Sister 50       breast; had a lumpectomy and is on tamoxifen   Breast cancer Sister    Colon polyps Sister    Esophageal cancer Neg Hx    Rectal cancer Neg Hx    Stomach cancer Neg Hx    Diabetes Neg Hx    No Known Allergies   Patient Care  Team: Maximiano Coss, NP as PCP - General (Adult Health Nurse Practitioner) Stann Mainland, Elly Modena, MD as Consulting Physician (Orthopedic Surgery) Vania Rea, MD as Consulting Physician (Obstetrics and Gynecology)   Medications: Outpatient Medications Prior to Visit  Medication Sig   ibuprofen (ADVIL) 200 MG tablet Take 200 mg by mouth every 6 (six) hours as needed for fever, headache or mild pain.   Insulin Pen Needle 29G X 12.7MM MISC Inject 15 Units into the skin daily.   polyethylene glycol (MIRALAX) 17 g packet Take 17 g by mouth daily as needed for mild constipation.   [DISCONTINUED] dapagliflozin propanediol (FARXIGA) 10 MG TABS tablet Take 1 tablet (10 mg total) by mouth daily before breakfast.   [DISCONTINUED] rosuvastatin (CRESTOR) 10 MG tablet Take 1 tablet (10 mg total) by mouth daily.   No facility-administered medications prior to visit.    Review of Systems  Constitutional: Negative.   HENT: Negative.    Eyes: Negative.   Respiratory: Negative.    Cardiovascular: Negative.   Gastrointestinal: Negative.   Genitourinary: Negative.   Musculoskeletal: Negative.   Skin: Negative.   Neurological: Negative.   Psychiatric/Behavioral: Negative.    All other systems reviewed and are negative.   Last CBC Lab Results  Component Value Date   WBC 3.2 (L) 04/22/2021   HGB 12.7 04/22/2021   HCT 39.5 04/22/2021   MCV 83.6 04/22/2021   MCH 27.0 04/14/2021   RDW 13.9 04/22/2021   PLT 229.0 83/38/2505   Last metabolic panel Lab Results  Component Value Date   GLUCOSE 67 (L) 04/22/2021   NA 143 04/22/2021   K 4.1 04/22/2021   CL 104 04/22/2021   CO2 24 04/22/2021   BUN 15 04/22/2021   CREATININE 0.74 04/22/2021   GFRNONAA >60 04/14/2021   CALCIUM 10.2 04/22/2021   PROT 7.7 04/22/2021   ALBUMIN 4.3 04/22/2021   LABGLOB 3.0 04/11/2020   AGRATIO 1.5 04/11/2020   BILITOT 0.4 04/22/2021   ALKPHOS 72 04/22/2021   AST 23 04/22/2021   ALT 26 04/22/2021   ANIONGAP 8  04/14/2021   Last lipids Lab Results  Component Value Date  CHOL 207 (H) 04/11/2020   HDL 60 04/11/2020   LDLCALC 130 (H) 04/11/2020   TRIG 94 04/11/2020   CHOLHDL 3.5 04/11/2020   Last hemoglobin A1c Lab Results  Component Value Date   HGBA1C 5.7 (A) 07/25/2021   Last thyroid functions Lab Results  Component Value Date   TSH 2.44 11/06/2020   Last vitamin D No results found for: "25OHVITD2", "25OHVITD3", "VD25OH" Last vitamin B12 and Folate No results found for: "VITAMINB12", "FOLATE"      Objective:     BP 128/68   Pulse 76   Temp 98.2 F (36.8 C) (Temporal)   Resp 18   Ht '4\' 11"'$  (1.499 m)   Wt 144 lb 6.4 oz (65.5 kg)   SpO2 98%   BMI 29.17 kg/m   BP Readings from Last 3 Encounters:  12/05/21 128/68  07/25/21 112/66  05/22/21 118/64   Wt Readings from Last 3 Encounters:  12/05/21 144 lb 6.4 oz (65.5 kg)  07/25/21 138 lb 3.2 oz (62.7 kg)  05/22/21 136 lb 12.8 oz (62.1 kg)   SpO2 Readings from Last 3 Encounters:  12/05/21 98%  07/25/21 99%  05/22/21 98%      Physical Exam Vitals and nursing note reviewed.  Constitutional:      General: She is not in acute distress.    Appearance: Normal appearance. She is not ill-appearing, toxic-appearing or diaphoretic.  HENT:     Head: Normocephalic and atraumatic.     Right Ear: Tympanic membrane, ear canal and external ear normal. There is no impacted cerumen.     Left Ear: Tympanic membrane, ear canal and external ear normal. There is no impacted cerumen.     Nose: Nose normal. No congestion or rhinorrhea.     Mouth/Throat:     Mouth: Mucous membranes are moist.     Pharynx: Oropharynx is clear. No oropharyngeal exudate or posterior oropharyngeal erythema.  Eyes:     General: No scleral icterus.       Right eye: No discharge.        Left eye: No discharge.     Extraocular Movements: Extraocular movements intact.     Conjunctiva/sclera: Conjunctivae normal.     Pupils: Pupils are equal, round, and  reactive to light.  Neck:     Vascular: No carotid bruit.  Cardiovascular:     Rate and Rhythm: Normal rate and regular rhythm.     Pulses: Normal pulses.     Heart sounds: Normal heart sounds. No murmur heard.    No friction rub. No gallop.  Pulmonary:     Effort: Pulmonary effort is normal. No respiratory distress.     Breath sounds: Normal breath sounds. No stridor. No wheezing, rhonchi or rales.  Chest:     Chest wall: No tenderness.  Abdominal:     General: Abdomen is flat. Bowel sounds are normal. There is no distension.     Palpations: There is no mass.     Tenderness: There is no abdominal tenderness. There is no right CVA tenderness, left CVA tenderness, guarding or rebound.     Hernia: No hernia is present.  Musculoskeletal:        General: No swelling, tenderness, deformity or signs of injury. Normal range of motion.     Cervical back: Normal range of motion and neck supple. No rigidity or tenderness.     Right lower leg: No edema.     Left lower leg: No edema.  Lymphadenopathy:  Cervical: No cervical adenopathy.  Skin:    General: Skin is warm and dry.     Capillary Refill: Capillary refill takes less than 2 seconds.     Coloration: Skin is not jaundiced or pale.     Findings: No bruising, erythema, lesion or rash.  Neurological:     General: No focal deficit present.     Mental Status: She is alert and oriented to person, place, and time. Mental status is at baseline.     Cranial Nerves: No cranial nerve deficit.     Sensory: No sensory deficit.     Motor: No weakness.     Coordination: Coordination normal.     Gait: Gait normal.     Deep Tendon Reflexes: Reflexes normal.  Psychiatric:        Mood and Affect: Mood normal.        Behavior: Behavior normal.        Thought Content: Thought content normal.        Judgment: Judgment normal.      No results found for any visits on 12/05/21.    Assessment & Plan:    Routine Health Maintenance and Physical  Exam  Immunization History  Administered Date(s) Administered   Influenza,inj,Quad PF,6+ Mos 04/06/2016   PFIZER(Purple Top)SARS-COV-2 Vaccination 05/27/2019, 06/17/2019, 02/06/2020, 01/15/2021   Pneumococcal Conjugate-13 11/29/2018   Pneumococcal Polysaccharide-23 04/11/2020    Health Maintenance  Topic Date Due   URINE MICROALBUMIN  Never done   INFLUENZA VACCINE  12/03/2021   COVID-19 Vaccine (5 - Pfizer series) 12/21/2021 (Originally 03/12/2021)   Zoster Vaccines- Shingrix (1 of 2) 03/07/2022 (Originally 09/14/2001)   TETANUS/TDAP  12/06/2022 (Originally 09/15/1970)   Hepatitis C Screening  12/06/2022 (Originally 09/14/1969)   OPHTHALMOLOGY EXAM  01/11/2022   HEMOGLOBIN A1C  01/25/2022   FOOT EXAM  02/01/2022   MAMMOGRAM  01/01/2023   COLONOSCOPY (Pts 45-71yr Insurance coverage will need to be confirmed)  12/21/2025   Pneumonia Vaccine 70 Years old  Completed   DEXA SCAN  Completed   HPV VACCINES  Aged Out    Discussed health benefits of physical activity, and encouraged her to engage in regular exercise appropriate for her age and condition.  Problem List Items Addressed This Visit   None Visit Diagnoses     Annual physical exam    -  Primary   Relevant Orders   B12 and Folate Panel   Hyperlipidemia associated with type 2 diabetes mellitus (HCC)       Relevant Medications   rosuvastatin (CRESTOR) 10 MG tablet   dapagliflozin propanediol (FARXIGA) 10 MG TABS tablet   Other Relevant Orders   CBC with Differential/Platelet   Comprehensive metabolic panel   Hemoglobin A1c   Lipid panel   TSH   Microalbumin / creatinine urine ratio   B12 and Folate Panel      Return if symptoms worsen or fail to improve.     PLAN Exam unremarkable Labs collected. Will follow up with the patient as warranted. Patient encouraged to call clinic with any questions, comments, or concerns.   RMaximiano Coss NP

## 2021-12-05 NOTE — Patient Instructions (Addendum)
Lindsey Bray -   [name] to see you!  It has been a great experience to take part in your care.  I recommend these providers if you need someone new:  Inda Coke, [place] Dimas Chyle, MD Agustina Caroli, MD Myrna Blazer Early, NP Jeralyn Ruths, DNP Grier Mitts, MD Berniece Pap, MD  Stay well!  [name]     If you have lab work done today you will be contacted with your lab results within the next 2 weeks.  If you have not heard from [place] then please contact us. The fastest way to get your results is to register for My Chart.   IF you received an x-ray today, you will receive an invoice from [facility]. Please contact [facility] at [phone] with questions or concerns regarding your invoice.   IF you received labwork today, you will receive an invoice from [facility]. Please contact [facility] at [phone] with questions or concerns regarding your invoice.   Our billing staff will not be able to assist you with questions regarding bills from these companies.  You will be contacted with the lab results as soon as they are available. The fastest way to get your results is to activate your My Chart account. Instructions are located on the last page of this paperwork. If you have not heard from [place] regarding the results in 2 weeks, please contact this office.

## 2021-12-12 ENCOUNTER — Telehealth: Payer: Self-pay | Admitting: Hematology

## 2021-12-12 NOTE — Telephone Encounter (Signed)
Scheduled appt per 8/3 referral. Pt is aware of appt date and time. Pt is aware to arrive 15 mins prior to appt time and to bring and updated insurance card. Pt is aware of appt location.   

## 2021-12-20 DIAGNOSIS — G43909 Migraine, unspecified, not intractable, without status migrainosus: Secondary | ICD-10-CM | POA: Diagnosis not present

## 2021-12-20 DIAGNOSIS — G8929 Other chronic pain: Secondary | ICD-10-CM | POA: Diagnosis not present

## 2021-12-20 DIAGNOSIS — E119 Type 2 diabetes mellitus without complications: Secondary | ICD-10-CM | POA: Diagnosis not present

## 2021-12-20 DIAGNOSIS — E663 Overweight: Secondary | ICD-10-CM | POA: Diagnosis not present

## 2021-12-20 DIAGNOSIS — J449 Chronic obstructive pulmonary disease, unspecified: Secondary | ICD-10-CM | POA: Diagnosis not present

## 2021-12-24 ENCOUNTER — Telehealth: Payer: Self-pay | Admitting: Registered Nurse

## 2021-12-24 NOTE — Telephone Encounter (Signed)
Caller name: Lattie Haw Investment banker, corporate with [employer]  On DPR? :yes/no: No  Call back number: [phone]  Provider they see: Maximiano Coss  Reason for call: States that pt is supposed to be taking rosuvastin 10 mg, but has not actually had it filled in 2023.  Wanted to make sure that we were aware. Has also tried to reach pt, but. has not been able to get in touch with her.

## 2021-12-24 NOTE — Telephone Encounter (Signed)
I attempted to reach the pt did not answer and no VM. I even called her emergency contact and it went straight to VM and no answer . I did call Lattie Haw at the [facility] and explained to her that I could not get in the touch at this time myself. She states that their pharmacist pulled her medication and it was filled 12/2021 but pt has not picked it up and she is has not been refilling the medication Rosuvastatin 10 mg. They wanted [place] to be aware that pt is not taking that medication

## 2021-12-26 ENCOUNTER — Telehealth: Payer: Self-pay | Admitting: Physician Assistant

## 2021-12-26 NOTE — Telephone Encounter (Signed)
Changed pt's appt time due to provider schedule. Called pt 3x, no answer and no vm. Will try to reach pt again tomorrow. Will also mail updated calendar to pt.

## 2022-01-02 DIAGNOSIS — Z1231 Encounter for screening mammogram for malignant neoplasm of breast: Secondary | ICD-10-CM | POA: Diagnosis not present

## 2022-01-03 ENCOUNTER — Inpatient Hospital Stay: Payer: HMO | Attending: Hematology | Admitting: Physician Assistant

## 2022-01-03 ENCOUNTER — Encounter: Payer: Self-pay | Admitting: Physician Assistant

## 2022-01-03 ENCOUNTER — Inpatient Hospital Stay: Payer: HMO

## 2022-01-03 ENCOUNTER — Telehealth: Payer: Self-pay | Admitting: Physician Assistant

## 2022-01-03 ENCOUNTER — Encounter: Payer: PPO | Admitting: Hematology

## 2022-01-03 ENCOUNTER — Other Ambulatory Visit: Payer: Self-pay

## 2022-01-03 VITALS — BP 165/77 | HR 84 | Temp 97.7°F | Resp 16 | Ht 59.0 in | Wt 143.5 lb

## 2022-01-03 DIAGNOSIS — D709 Neutropenia, unspecified: Secondary | ICD-10-CM | POA: Insufficient documentation

## 2022-01-03 DIAGNOSIS — Z803 Family history of malignant neoplasm of breast: Secondary | ICD-10-CM | POA: Insufficient documentation

## 2022-01-03 DIAGNOSIS — Z800 Family history of malignant neoplasm of digestive organs: Secondary | ICD-10-CM | POA: Diagnosis not present

## 2022-01-03 DIAGNOSIS — D708 Other neutropenia: Secondary | ICD-10-CM

## 2022-01-03 LAB — CBC WITH DIFFERENTIAL (CANCER CENTER ONLY)
Abs Immature Granulocytes: 0.01 10*3/uL (ref 0.00–0.07)
Basophils Absolute: 0 10*3/uL (ref 0.0–0.1)
Basophils Relative: 1 %
Eosinophils Absolute: 0 10*3/uL (ref 0.0–0.5)
Eosinophils Relative: 1 %
HCT: 42.2 % (ref 36.0–46.0)
Hemoglobin: 13.9 g/dL (ref 12.0–15.0)
Immature Granulocytes: 0 %
Lymphocytes Relative: 33 %
Lymphs Abs: 1 10*3/uL (ref 0.7–4.0)
MCH: 26.7 pg (ref 26.0–34.0)
MCHC: 32.9 g/dL (ref 30.0–36.0)
MCV: 81.2 fL (ref 80.0–100.0)
Monocytes Absolute: 0.3 10*3/uL (ref 0.1–1.0)
Monocytes Relative: 9 %
Neutro Abs: 1.7 10*3/uL (ref 1.7–7.7)
Neutrophils Relative %: 56 %
Platelet Count: 182 10*3/uL (ref 150–400)
RBC: 5.2 MIL/uL — ABNORMAL HIGH (ref 3.87–5.11)
RDW: 14.2 % (ref 11.5–15.5)
WBC Count: 3.1 10*3/uL — ABNORMAL LOW (ref 4.0–10.5)
nRBC: 0 % (ref 0.0–0.2)

## 2022-01-03 LAB — CMP (CANCER CENTER ONLY)
ALT: 18 U/L (ref 0–44)
AST: 22 U/L (ref 15–41)
Albumin: 4.4 g/dL (ref 3.5–5.0)
Alkaline Phosphatase: 81 U/L (ref 38–126)
Anion gap: 4 — ABNORMAL LOW (ref 5–15)
BUN: 15 mg/dL (ref 8–23)
CO2: 27 mmol/L (ref 22–32)
Calcium: 9.9 mg/dL (ref 8.9–10.3)
Chloride: 109 mmol/L (ref 98–111)
Creatinine: 0.75 mg/dL (ref 0.44–1.00)
GFR, Estimated: >60 mL/min (ref >60)
Glucose, Bld: 100 mg/dL — ABNORMAL HIGH (ref 70–99)
Potassium: 3.9 mmol/L (ref 3.5–5.1)
Sodium: 140 mmol/L (ref 135–145)
Total Bilirubin: 0.6 mg/dL (ref 0.3–1.2)
Total Protein: 7.8 g/dL (ref 6.5–8.1)

## 2022-01-03 LAB — VITAMIN B12: Vitamin B-12: 461 pg/mL (ref 180–914)

## 2022-01-03 LAB — HEPATITIS B CORE ANTIBODY, TOTAL: Hep B Core Total Ab: NONREACTIVE

## 2022-01-03 LAB — HIV ANTIBODY (ROUTINE TESTING W REFLEX): HIV Screen 4th Generation wRfx: NONREACTIVE

## 2022-01-03 LAB — SEDIMENTATION RATE: Sed Rate: 0 mm/hr (ref 0–22)

## 2022-01-03 LAB — HEPATITIS B SURFACE ANTIBODY,QUALITATIVE: Hep B S Ab: NONREACTIVE

## 2022-01-03 LAB — C-REACTIVE PROTEIN: CRP: 0.6 mg/dL (ref <1.0)

## 2022-01-03 LAB — HM MAMMOGRAPHY

## 2022-01-03 LAB — HEPATITIS C ANTIBODY: HCV Ab: NONREACTIVE

## 2022-01-03 NOTE — Progress Notes (Unsigned)
[facility] [phone]   [phone]  INITIAL CONSULT NOTE  Patient Care Team: Morrow, Richard, NP as PCP - General (Adult Health Nurse Practitioner) Rogers, Jason Patrick, MD as Consulting Physician (Orthopedic Surgery) Wein, Robert, MD as Consulting Physician (Obstetrics and Gynecology)  Hematological/Oncological History #Leukopenia/Neutropenia: 04/14/2021: WBC 3.9 (L), Hgb 11.3 (L), Plt 185 04/22/2021: WBC 3.2 (L), Hgb 12.7, Plt 229, ANC 1.0 (L) 12/05/2021: WBC 2.6 (L), Hgb 14.7, Plt 188, ANC 1.0 (L)  01/03/2022: Establish care with [facility] Hematology with Dr. John Dorsey and Irene Thayil PA-C  CHIEF COMPLAINTS/PURPOSE OF CONSULTATION:  "Leukopenia/neutropenia "  HISTORY OF PRESENTING ILLNESS:  Lindsey Bray 71 y.o. female with medical history significant for allergies, arthritis and asthma presents to the hematology clinic for evaluation for neutropenia.  She is unaccompanied for this visit.  On exam today, Scrivens reports that she has stable energy levels and is able to complete all her daily activities on her own.  She has a good appetite and denies any weight loss.  She denies any nausea, vomiting or abdominal pain.  Her bowel habits are unchanged without any recurrent episodes of diarrhea or constipation.  She denies swelling or joint pain.  She denies recurrent infections.  She denies fevers, chills, night sweats, shortness of breath, chest pain or cough.  Has no other complaints.  Rest of the 10 point ROS is below.  MEDICAL HISTORY:  Past Medical History:  Diagnosis Date   Allergy    as a child and young adult   Arthritis    Oa left shoulder   Asthma    as a child and young adult    SURGICAL HISTORY: Past Surgical History:  Procedure Laterality Date   BREAST SURGERY Right 1970 (age 18)   benign breast tumors   CESAREAN SECTION     x1   COLONOSCOPY  2004   ~16 yrs ago- was normal    trigger thumb  05/2017   r hand    SOCIAL  HISTORY: Social History   Socioeconomic History   Marital status: Single    Spouse name: Not on file   Number of children: 1   Years of education: Not on file   Highest education level: Not on file  Occupational History   Not on file  Tobacco Use   Smoking status: Never   Smokeless tobacco: Never  Vaping Use   Vaping Use: Never used  Substance and Sexual Activity   Alcohol use: No    Alcohol/week: 0.0 standard drinks of alcohol   Drug use: Never   Sexual activity: Not on file  Other Topics Concern   Not on file  Social History Narrative   Lives by herself in [place]; has a 31 yo son, who is married. No grandchildren.   Social Determinants of Health   Financial Resource Strain: Not on file  Food Insecurity: Not on file  Transportation Needs: Not on file  Physical Activity: Not on file  Stress: Not on file  Social Connections: Not on file  Intimate Partner Violence: Not on file    FAMILY HISTORY: Family History  Problem Relation Age of Onset   Cancer Mother 76       colon- died age 77    Colon cancer Mother    COPD Father    Heart disease Father    Cancer Sister 44       breast   Breast cancer Sister    Cancer Sister 61         breast; had a lumpectomy and is on tamoxifen   Breast cancer Sister    Colon polyps Sister    Esophageal cancer Neg Hx    Rectal cancer Neg Hx    Stomach cancer Neg Hx    Diabetes Neg Hx     ALLERGIES:  has No Known Allergies.  MEDICATIONS:  Current Outpatient Medications  Medication Sig Dispense Refill   ALBUTEROL IN Inhale 2 puffs into the lungs as needed. As needed     dapagliflozin propanediol (FARXIGA) 10 MG TABS tablet Take 1 tablet (10 mg total) by mouth daily before breakfast. 90 tablet 3   ibuprofen (ADVIL) 200 MG tablet Take 200 mg by mouth every 6 (six) hours as needed for fever, headache or mild pain.     Ibuprofen-Famotidine (DUEXIS) 800-26.6 MG TABS Take 1 tablet 3 times a day by oral route.     Insulin Pen Needle  29G X 12.7MM MISC Inject 15 Units into the skin daily. 100 each 5   polyethylene glycol (MIRALAX) 17 g packet Take 17 g by mouth daily as needed for mild constipation. 14 each 0   rosuvastatin (CRESTOR) 10 MG tablet Take 1 tablet (10 mg total) by mouth daily. 90 tablet 3   No current facility-administered medications for this visit.    REVIEW OF SYSTEMS:   Constitutional: ( - ) fevers, ( - )  chills , ( - ) night sweats Eyes: ( - ) blurriness of vision, ( - ) double vision, ( - ) watery eyes Ears, nose, mouth, throat, and face: ( - ) mucositis, ( - ) sore throat Respiratory: ( - ) cough, ( - ) dyspnea, ( - ) wheezes Cardiovascular: ( - ) palpitation, ( - ) chest discomfort, ( - ) lower extremity swelling Gastrointestinal:  ( - ) nausea, ( - ) heartburn, ( - ) change in bowel habits Skin: ( - ) abnormal skin rashes Lymphatics: ( - ) new lymphadenopathy, ( - ) easy bruising Neurological: ( - ) numbness, ( - ) tingling, ( - ) new weaknesses Behavioral/Psych: ( - ) mood change, ( - ) new changes  All other systems were reviewed with the patient and are negative.  PHYSICAL EXAMINATION: ECOG PERFORMANCE STATUS: 0 - Asymptomatic  Vitals:   01/03/22 1002  BP: (!) 165/77  Pulse: 84  Resp: 16  Temp: 97.7 F (36.5 C)  SpO2: 99%   Filed Weights   01/03/22 1002  Weight: 143 lb 8 oz (65.1 kg)    GENERAL: well appearing female in NAD  SKIN: skin color, texture, turgor are normal, no rashes or significant lesions EYES: conjunctiva are pink and non-injected, sclera clear OROPHARYNX: no exudate, no erythema; lips, buccal mucosa, and tongue normal  NECK: supple, non-tender LYMPH:  no palpable lymphadenopathy in the cervical or supraclavicular lymph nodes.  LUNGS: clear to auscultation and percussion with normal breathing effort HEART: regular rate & rhythm and no murmurs and no lower extremity edema Musculoskeletal: no cyanosis of digits and no clubbing  PSYCH: alert & oriented x 3, fluent  speech NEURO: no focal motor/sensory deficits  LABORATORY DATA:  I have reviewed the data as listed    Latest Ref Rng & Units 12/05/2021    8:59 AM 04/22/2021    2:05 PM 04/14/2021    4:40 AM  CBC  WBC 4.0 - 10.5 K/uL 2.6  3.2  3.9   Hemoglobin 12.0 - 15.0 g/dL 14.7  12.7  11.4   Hematocrit 36.0 - 46.0 %   45.8  39.5  35.1   Platelets 150.0 - 400.0 K/uL 188.0  229.0  192        Latest Ref Rng & Units 12/05/2021    8:59 AM 04/22/2021    2:05 PM 04/14/2021    4:40 AM  CMP  Glucose 70 - 99 mg/dL 105  67  102   BUN 6 - 23 mg/dL 13  15  <5   Creatinine 0.40 - 1.20 mg/dL 0.79  0.74  0.59   Sodium 135 - 145 mEq/L 140  143  140   Potassium 3.5 - 5.1 mEq/L 4.6  4.1  3.4   Chloride 96 - 112 mEq/L 106  104  108   CO2 19 - 32 mEq/L 26  24  24   Calcium 8.4 - 10.5 mg/dL 9.9  10.2  8.6   Total Protein 6.0 - 8.3 g/dL 7.8  7.7    Total Bilirubin 0.2 - 1.2 mg/dL 0.5  0.4    Alkaline Phos 39 - 117 U/L 70  72    AST 0 - 37 U/L 25  23    ALT 0 - 35 U/L 20  26     ASSESSMENT & PLAN Lindsey Bray is a 71 y.o. female presents to the hematology clinic for evaluation for leukopenia/neutropenia.  The differentials for neutropenia include benign ethnic neutropenia, infectious etiology, nutritional etiology, inflammatory etiology, or medication.  The patient is not taking any medications known to cause neutropenia.  We will order viral studies with hep B, hep C, and HIV as well.  In addition, we will check for nutritional anemias with B12 and folate levels.    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  This is a common condition whereby individuals of African or Mediterranean descent tend to have slightly lower white blood cell counts in the general population.  Hallmark of the syndrome is an ANC between 1000 and 1500 which this patient falls into.  This is of little to no clinical consequence.  This is a diagnosis of exclusion therefore we will do further work-up in order to ensure  there is no other etiology.   Assuming none of the studies show any concerning abnormalities, we will see the patient back in 6 months time to continue to monitor.   # Leukopenia/Neutropenia  --Rule out infectious etiology with hepatitis B serologies, hepatitis C serologies, and HIV.  --Nutritional evaluation with  vitamin B12, MMA and folate.  --Inflammatory evaluation with ESR and CRP.   --Repeat CBC and CMP.  Additionally, we will order peripheral blood film.  --Assuming there are no concerning findings on the blood work-up today. we will plan to see the patient back in approximately 6 months time.    No orders of the defined types were placed in this encounter.   All questions were answered. The patient knows to call the clinic with any problems, questions or concerns.  I have spent a total of 60 minutes minutes of face-to-face and non-face-to-face time, preparing to see the patient, obtaining and/or reviewing separately obtained history, performing a medically appropriate examination, counseling and educating the patient, ordering tests/procedures, documenting clinical information in the electronic health record, independently interpreting results and communicating results to the patient, and care coordination.   Irene Thayil, PA-C Department of Hematology/Oncology [facility] Phone: [phone]  Patient was seen with Dr. Dorsey  I have read the above note and personally examined the patient. I agree with   the assessment and plan as noted above.  Briefly Mr. Laura Ferrie is a 71-year-old female who presents for evaluation of leukopenia.  Patient has a consistently low white blood cell count, but fortunately her individual white blood cell counts are appropriate, with a normal ANC and absolute lymphocyte count.  Given these findings we will conduct a full leukopenia work-up to include nutritional deficiencies, viral etiologies, and inflammation.   In the event no clear etiology can be found would recommend continued observation of her condition.  The patient voiced understanding of the plan moving forward.   John T. Dorsey, MD Department of Hematology/Oncology [facility] at [facility] Phone: [phone] Pager: [phone] Email: [email or web address]  

## 2022-01-03 NOTE — Telephone Encounter (Signed)
Per 9/1 los called pt, no answer and unable to leave message.   Pt is mychart active

## 2022-01-07 LAB — METHYLMALONIC ACID, SERUM: Methylmalonic Acid, Quantitative: 146 nmol/L (ref 0–378)

## 2022-01-09 ENCOUNTER — Encounter: Payer: Self-pay | Admitting: Registered Nurse

## 2022-01-15 DIAGNOSIS — E119 Type 2 diabetes mellitus without complications: Secondary | ICD-10-CM | POA: Diagnosis not present

## 2022-01-15 LAB — HM DIABETES EYE EXAM

## 2022-02-05 ENCOUNTER — Telehealth: Payer: Self-pay

## 2022-02-05 NOTE — Telephone Encounter (Signed)
Her WBC has improved and neutrophil count is back to normal. Remaining workup was negative. No further workup is recommended at this time. We will monitor for now and see  back in 6 months with repeat labs.  Pt advised with VU

## 2022-03-12 ENCOUNTER — Encounter: Payer: Self-pay | Admitting: Physician Assistant

## 2022-03-12 ENCOUNTER — Ambulatory Visit (INDEPENDENT_AMBULATORY_CARE_PROVIDER_SITE_OTHER): Payer: HMO | Admitting: Physician Assistant

## 2022-03-12 VITALS — BP 124/80 | HR 74 | Temp 97.5°F | Ht 59.0 in | Wt 144.0 lb

## 2022-03-12 DIAGNOSIS — E1169 Type 2 diabetes mellitus with other specified complication: Secondary | ICD-10-CM | POA: Diagnosis not present

## 2022-03-12 DIAGNOSIS — E785 Hyperlipidemia, unspecified: Secondary | ICD-10-CM | POA: Diagnosis not present

## 2022-03-12 DIAGNOSIS — J4520 Mild intermittent asthma, uncomplicated: Secondary | ICD-10-CM

## 2022-03-12 DIAGNOSIS — E119 Type 2 diabetes mellitus without complications: Secondary | ICD-10-CM

## 2022-03-12 LAB — POCT GLYCOSYLATED HEMOGLOBIN (HGB A1C): Hemoglobin A1C: 6.5 % — AB (ref 4.0–5.6)

## 2022-03-12 MED ORDER — ALBUTEROL SULFATE HFA 108 (90 BASE) MCG/ACT IN AERS: 2.0000 | INHALATION_SPRAY | Freq: Four times a day (QID) | RESPIRATORY_TRACT | 0 refills | Status: AC | PRN

## 2022-03-12 NOTE — Patient Instructions (Signed)
It was great to see you!  Start the cholesterol medication  Continue farxiga  If your farxiga is still expensive at the new year, please tell me and I will investigate if there is a more affordable medication  Let's follow-up in 3 months, sooner if you have concerns.  Take care,  Inda Coke PA-C

## 2022-03-12 NOTE — Progress Notes (Signed)
Lindsey Bray is a 71 y.o. female here for transfer of care.  History of Present Illness:   Chief Complaint  Patient presents with   Transfer of care   Diabetes    HPI  Diabetes Patient reports that she was previously on Metformin (GI upset) and Rybelsus (HA) but had adverse side effects. She is currently on 10 mg Farxiga and experiences excessive urination and polydipsia. She denies any yeast infection. She had seen her endocrinologist regularly, Dr. Renato Shin, for her blood sugar levels. She no longer has one as he retired. She reports that she participates in regular exercise.   Asthma Overall controlled at this time. Likes to have albuterol inhaler on hand for prn use. Needs refill. Has not had to use in quite some time.  HLD Was started on crestor 10 mg daily. Has not started this medication.    Past Medical History:  Diagnosis Date   Allergy    as a child and young adult   Arthritis    Oa left shoulder   Asthma    as a child and young adult     Social History   Tobacco Use   Smoking status: Never   Smokeless tobacco: Never  Vaping Use   Vaping Use: Never used  Substance Use Topics   Alcohol use: No    Alcohol/week: 0.0 standard drinks of alcohol   Drug use: Never    Past Surgical History:  Procedure Laterality Date   BREAST SURGERY Right 8 (age 54)   benign breast tumors   CESAREAN SECTION     x1   COLONOSCOPY  2004   ~16 yrs ago- was normal    trigger thumb  05/2017   r hand    Family History  Problem Relation Age of Onset   Cancer Mother 41       colon- died age 87    Colon cancer Mother    COPD Father    Heart disease Father    Cancer Sister 5       breast   Breast cancer Sister    Cancer Sister 57       breast; had a lumpectomy and is on tamoxifen   Breast cancer Sister    Colon polyps Sister    Esophageal cancer Neg Hx    Rectal cancer Neg Hx    Stomach cancer Neg Hx    Diabetes Neg Hx     No Known Allergies  Current  Medications:   Current Outpatient Medications:    albuterol (VENTOLIN HFA) 108 (90 Base) MCG/ACT inhaler, Inhale 2 puffs into the lungs every 6 (six) hours as needed for wheezing or shortness of breath., Disp: 8 g, Rfl: 0   dapagliflozin propanediol (FARXIGA) 10 MG TABS tablet, Take 1 tablet (10 mg total) by mouth daily before breakfast., Disp: 90 tablet, Rfl: 3   ibuprofen (ADVIL) 200 MG tablet, Take 200 mg by mouth every 6 (six) hours as needed for fever, headache or mild pain., Disp: , Rfl:    polyethylene glycol (MIRALAX) 17 g packet, Take 17 g by mouth daily as needed for mild constipation., Disp: 14 each, Rfl: 0   rosuvastatin (CRESTOR) 10 MG tablet, Take 1 tablet (10 mg total) by mouth daily. (Patient not taking: Reported on 03/12/2022), Disp: 90 tablet, Rfl: 3   Review of Systems:   ROS Negative unless otherwise specified per HPI.  Vitals:   Vitals:   03/12/22 1021  BP: 124/80  Pulse: 74  Temp: (!) 97.5 F (36.4 C)  TempSrc: Temporal  SpO2: 98%  Weight: 144 lb (65.3 kg)  Height: '4\' 11"'$  (1.499 m)     Body mass index is 29.08 kg/m.  Physical Exam:   Physical Exam Constitutional:      General: She is not in acute distress.    Appearance: Normal appearance. She is not ill-appearing.  HENT:     Head: Normocephalic and atraumatic.     Right Ear: External ear normal.     Left Ear: External ear normal.  Eyes:     Extraocular Movements: Extraocular movements intact.     Pupils: Pupils are equal, round, and reactive to light.  Cardiovascular:     Rate and Rhythm: Normal rate and regular rhythm.     Heart sounds: Normal heart sounds. No murmur heard.    No gallop.  Pulmonary:     Effort: Pulmonary effort is normal. No respiratory distress.     Breath sounds: Normal breath sounds. No wheezing or rales.  Skin:    General: Skin is warm and dry.  Neurological:     Mental Status: She is alert and oriented to person, place, and time.  Psychiatric:        Judgment:  Judgment normal.    Diabetic Foot Exam - Simple   Simple Foot Form Diabetic Foot exam was performed with the following findings: Yes 03/12/2022 10:59 AM  Visual Inspection No deformities, no ulcerations, no other skin breakdown bilaterally: Yes Sensation Testing Intact to touch and monofilament testing bilaterally: Yes Pulse Check Posterior Tibialis and Dorsalis pulse intact bilaterally: Yes Comments    Results for orders placed or performed in visit on 03/12/22  POCT HgB A1C  Result Value Ref Range   Hemoglobin A1C 6.5 (A) 4.0 - 5.6 %    Assessment and Plan:   Hyperlipidemia associated with type 2 diabetes mellitus ([place]) Uncontrolled Start rosuvastatin 10 mg daily Follow-up in 3 months  Type 2 diabetes mellitus without complication, without long-term current use of insulin ([place]) Improving Discussed that she is in range and will continue farxiga 10 mg daily Asked her to let us know if this is still expensive in the new year and we will trial different medication if needed Follow-up in 3 months, sooner if concerns  Mild intermittent asthma without complication Well controlled Refill prn albuterol inhaler Follow-up as needed  I,Verona Buck,acting as a scribe for [employer], PA.,have documented all relevant documentation on the behalf of Inda Coke, PA,as directed by  Inda Coke, PA while in the presence of Inda Coke, [place].  I, Inda Coke, [place], have reviewed all documentation for this visit. The documentation on 03/12/22 for the exam, diagnosis, procedures, and orders are all accurate and complete.  Inda Coke, PA-C

## 2022-04-24 ENCOUNTER — Encounter: Payer: Self-pay | Admitting: Physician Assistant

## 2022-05-12 ENCOUNTER — Telehealth: Payer: Self-pay | Admitting: Physician Assistant

## 2022-05-12 NOTE — Telephone Encounter (Signed)
Called patient to r/s 3/1 appointment due to provider PAL. Unable to leave patient a voice message. Mailing reminder with new appointment information.

## 2022-06-12 ENCOUNTER — Encounter: Payer: Self-pay | Admitting: Physician Assistant

## 2022-06-12 ENCOUNTER — Ambulatory Visit (INDEPENDENT_AMBULATORY_CARE_PROVIDER_SITE_OTHER): Payer: HMO | Admitting: Physician Assistant

## 2022-06-12 ENCOUNTER — Other Ambulatory Visit: Payer: Self-pay | Admitting: *Deleted

## 2022-06-12 VITALS — BP 110/70 | HR 75 | Temp 97.8°F | Ht 59.0 in | Wt 144.0 lb

## 2022-06-12 DIAGNOSIS — E1169 Type 2 diabetes mellitus with other specified complication: Secondary | ICD-10-CM

## 2022-06-12 DIAGNOSIS — H6123 Impacted cerumen, bilateral: Secondary | ICD-10-CM

## 2022-06-12 DIAGNOSIS — E119 Type 2 diabetes mellitus without complications: Secondary | ICD-10-CM

## 2022-06-12 DIAGNOSIS — E785 Hyperlipidemia, unspecified: Secondary | ICD-10-CM

## 2022-06-12 LAB — COMPREHENSIVE METABOLIC PANEL WITH GFR
ALT: 23 U/L (ref 0–35)
AST: 23 U/L (ref 0–37)
Albumin: 4.3 g/dL (ref 3.5–5.2)
Alkaline Phosphatase: 77 U/L (ref 39–117)
BUN: 11 mg/dL (ref 6–23)
CO2: 28 meq/L (ref 19–32)
Calcium: 9.7 mg/dL (ref 8.4–10.5)
Chloride: 107 meq/L (ref 96–112)
Creatinine, Ser: 0.74 mg/dL (ref 0.40–1.20)
GFR: 81.79 mL/min
Glucose, Bld: 151 mg/dL — ABNORMAL HIGH (ref 70–99)
Potassium: 5 meq/L (ref 3.5–5.1)
Sodium: 144 meq/L (ref 135–145)
Total Bilirubin: 0.6 mg/dL (ref 0.2–1.2)
Total Protein: 7.2 g/dL (ref 6.0–8.3)

## 2022-06-12 LAB — LIPID PANEL
Cholesterol: 129 mg/dL (ref 0–200)
HDL: 54.9 mg/dL
LDL Cholesterol: 63 mg/dL (ref 0–99)
NonHDL: 73.72
Total CHOL/HDL Ratio: 2
Triglycerides: 55 mg/dL (ref 0.0–149.0)
VLDL: 11 mg/dL (ref 0.0–40.0)

## 2022-06-12 LAB — POCT GLYCOSYLATED HEMOGLOBIN (HGB A1C): Hemoglobin A1C: 7.4 % — AB (ref 4.0–5.6)

## 2022-06-12 MED ORDER — DEXCOM G6 SENSOR MISC: 2 refills | Status: DC

## 2022-06-12 MED ORDER — DEXCOM G6 RECEIVER DEVI: 0 refills | Status: DC

## 2022-06-12 MED ORDER — DEXCOM G6 TRANSMITTER MISC: 2 refills | Status: DC

## 2022-06-12 MED ORDER — OZEMPIC (0.25 OR 0.5 MG/DOSE) 2 MG/3ML ~~LOC~~ SOPN: 0.2500 mg | PEN_INJECTOR | SUBCUTANEOUS | 0 refills | Status: DC

## 2022-06-12 NOTE — Progress Notes (Signed)
Lindsey Bray is a 72 y.o. female here for a follow up of a pre-existing problem.  History of Present Illness:   Chief Complaint  Patient presents with   Diabetes   Hyperlipidemia   Acute Concerns:  Ear fullness She complains of her ear feeling full and itching.  She has required bilateral ear lavage in the past.  Chronic Issues: Diabetes She has been monitoring her blood sugar levels at home daily.  Her A1C is elevated today, 7.4.  She believes it may be elevated due to her diet during the holidays.  She endorses excessive thirst and urination attributed to her dose of Farxiga 10 mg daily.  She has not been able to exercise as frequently as she usually does due to her walking buddy having surgery and she does not like to walk by herself.  She has not been able to use Dexcom due to problems connecting to her phone.  She is receptive to try Ozempic today.   Rybelsus (HA)  Metformin (GI upset)   Past Medical History:  Diagnosis Date   Allergy    as a child and young adult   Arthritis    Oa left shoulder   Asthma    as a child and young adult     Social History   Tobacco Use   Smoking status: Never   Smokeless tobacco: Never  Vaping Use   Vaping Use: Never used  Substance Use Topics   Alcohol use: No    Alcohol/week: 0.0 standard drinks of alcohol   Drug use: Never    Past Surgical History:  Procedure Laterality Date   BREAST SURGERY Right 76 (age 55)   benign breast tumors   CESAREAN SECTION     x1   COLONOSCOPY  2004   ~16 yrs ago- was normal    trigger thumb  05/2017   r hand    Family History  Problem Relation Age of Onset   Cancer Mother 67       colon- died age 10    Colon cancer Mother    COPD Father    Heart disease Father    Cancer Sister 46       breast   Breast cancer Sister    Cancer Sister 12       breast; had a lumpectomy and is on tamoxifen   Breast cancer Sister    Colon polyps Sister    Esophageal cancer Neg Hx    Rectal  cancer Neg Hx    Stomach cancer Neg Hx    Diabetes Neg Hx     No Known Allergies  Current Medications:   Current Outpatient Medications:    albuterol (VENTOLIN HFA) 108 (90 Base) MCG/ACT inhaler, Inhale 2 puffs into the lungs every 6 (six) hours as needed for wheezing or shortness of breath., Disp: 8 g, Rfl: 0   dapagliflozin propanediol (FARXIGA) 10 MG TABS tablet, Take 1 tablet (10 mg total) by mouth daily before breakfast., Disp: 90 tablet, Rfl: 3   ibuprofen (ADVIL) 200 MG tablet, Take 200 mg by mouth every 6 (six) hours as needed for fever, headache or mild pain., Disp: , Rfl:    polyethylene glycol (MIRALAX) 17 g packet, Take 17 g by mouth daily as needed for mild constipation., Disp: 14 each, Rfl: 0   rosuvastatin (CRESTOR) 10 MG tablet, Take 1 tablet (10 mg total) by mouth daily., Disp: 90 tablet, Rfl: 3   Review of Systems:   ROS Negative  unless otherwise specified per HPI.  Vitals:   Vitals:   06/12/22 0807  BP: 110/70  Pulse: 75  Temp: 97.8 F (36.6 C)  TempSrc: Temporal  SpO2: 97%  Weight: 144 lb (65.3 kg)  Height: '4\' 11"'$  (1.499 m)     Body mass index is 29.08 kg/m.  Physical Exam:   Physical Exam Vitals and nursing note reviewed.  Constitutional:      General: She is not in acute distress.    Appearance: She is well-developed. She is not ill-appearing or toxic-appearing.  HENT:     Head: Normocephalic and atraumatic.     Right Ear: Ear canal and external ear normal. There is impacted cerumen. Tympanic membrane is not erythematous, retracted or bulging.     Left Ear: Tympanic membrane, ear canal and external ear normal. There is impacted cerumen. Tympanic membrane is not erythematous, retracted or bulging.     Nose: Nose normal.     Right Sinus: No maxillary sinus tenderness or frontal sinus tenderness.     Left Sinus: No maxillary sinus tenderness or frontal sinus tenderness.     Mouth/Throat:     Pharynx: Uvula midline. No posterior oropharyngeal  erythema.  Eyes:     General: Lids are normal.     Conjunctiva/sclera: Conjunctivae normal.  Neck:     Trachea: Trachea normal.  Cardiovascular:     Rate and Rhythm: Normal rate and regular rhythm.     Pulses: Normal pulses.     Heart sounds: Normal heart sounds, S1 normal and S2 normal.  Pulmonary:     Effort: Pulmonary effort is normal.     Breath sounds: Normal breath sounds. No decreased breath sounds, wheezing, rhonchi or rales.  Lymphadenopathy:     Cervical: No cervical adenopathy.  Skin:    General: Skin is warm and dry.  Neurological:     Mental Status: She is alert.     GCS: GCS eye subscore is 4. GCS verbal subscore is 5. GCS motor subscore is 6.  Psychiatric:        Speech: Speech normal.        Behavior: Behavior normal. Behavior is cooperative.    Results for orders placed or performed in visit on 06/12/22  POCT glycosylated hemoglobin (Hb A1C)  Result Value Ref Range   Hemoglobin A1C 7.4 (A) 4.0 - 5.6 %   Ceruminosis is noted.  Wax is removed by syringing and manual debridement. Instructions for home care to prevent wax buildup are given.   Assessment and Plan:   Type 2 diabetes mellitus without complication, without long-term current use of insulin (HCC) A1c worsened We are going to trial Ozempic -- sample provided Discussed use, side effects/benefits Stop Wilder Glade while trialing Ozempic Asked her to trail 0.25 mg weekly x 2-3 weeks and send [place] message or call to let us know if she would like to order this and continue this Follow-up in 4 months for CPE  Hyperlipidemia associated with type 2 diabetes mellitus ([place]) Update lipid panel and adjust crestor 10 mg daily as indicated  Bilateral impacted cerumen Ear lavage performed b/l Doing well overall  I,Rachel Rivera,acting as a scribe for [employer], PA.,have documented all relevant documentation on the behalf of Inda Coke, PA,as directed by  Inda Coke, PA while in the presence of  Inda Coke, [place].  I, Inda Coke, [place], have reviewed all documentation for this visit. The documentation on 06/12/22 for the exam, diagnosis, procedures, and orders are all accurate and complete.  Inda Coke, PA-C

## 2022-06-12 NOTE — Patient Instructions (Addendum)
It was great to see you!  Stop Farxiga and start Ozempic 0.25 mg weekly.  Message me in 2-4 weeks to let me know if you would like to continue this medication, so I can order for you.  Follow-up in 4 months for a physical.  Take care,  Aldona Bar

## 2022-06-12 NOTE — Addendum Note (Signed)
Addended by: Marian Sorrow on: 06/12/2022 08:57 AM   Modules accepted: Orders

## 2022-06-20 ENCOUNTER — Telehealth: Payer: Self-pay | Admitting: *Deleted

## 2022-06-20 NOTE — Telephone Encounter (Signed)
Received fax from [place] is not covered by insurance.  Tried to contact pt voicemail box not set up unable to leave message.

## 2022-06-23 NOTE — Telephone Encounter (Signed)
Tried to contact pt voicemail box not set up unable to leave message.

## 2022-06-25 NOTE — Telephone Encounter (Signed)
Tried to contact pt again, unable to leave message voicemail box not set up.

## 2022-06-26 ENCOUNTER — Other Ambulatory Visit: Payer: Self-pay | Admitting: Physician Assistant

## 2022-06-26 DIAGNOSIS — D708 Other neutropenia: Secondary | ICD-10-CM

## 2022-06-27 ENCOUNTER — Inpatient Hospital Stay (HOSPITAL_BASED_OUTPATIENT_CLINIC_OR_DEPARTMENT_OTHER): Payer: HMO | Admitting: Physician Assistant

## 2022-06-27 ENCOUNTER — Inpatient Hospital Stay: Payer: HMO | Attending: Physician Assistant

## 2022-06-27 VITALS — BP 136/70 | HR 79 | Temp 98.1°F | Resp 15 | Wt 145.8 lb

## 2022-06-27 DIAGNOSIS — Z789 Other specified health status: Secondary | ICD-10-CM

## 2022-06-27 DIAGNOSIS — Z79899 Other long term (current) drug therapy: Secondary | ICD-10-CM | POA: Diagnosis not present

## 2022-06-27 DIAGNOSIS — D708 Other neutropenia: Secondary | ICD-10-CM

## 2022-06-27 DIAGNOSIS — E119 Type 2 diabetes mellitus without complications: Secondary | ICD-10-CM | POA: Insufficient documentation

## 2022-06-27 DIAGNOSIS — D709 Neutropenia, unspecified: Secondary | ICD-10-CM | POA: Insufficient documentation

## 2022-06-27 LAB — CBC WITH DIFFERENTIAL (CANCER CENTER ONLY)
Abs Immature Granulocytes: 0.01 10*3/uL (ref 0.00–0.07)
Basophils Absolute: 0 10*3/uL (ref 0.0–0.1)
Basophils Relative: 1 %
Eosinophils Absolute: 0.1 10*3/uL (ref 0.0–0.5)
Eosinophils Relative: 2 %
HCT: 41.8 % (ref 36.0–46.0)
Hemoglobin: 13.7 g/dL (ref 12.0–15.0)
Immature Granulocytes: 0 %
Lymphocytes Relative: 46 %
Lymphs Abs: 1.4 10*3/uL (ref 0.7–4.0)
MCH: 26.8 pg (ref 26.0–34.0)
MCHC: 32.8 g/dL (ref 30.0–36.0)
MCV: 81.6 fL (ref 80.0–100.0)
Monocytes Absolute: 0.5 10*3/uL (ref 0.1–1.0)
Monocytes Relative: 15 %
Neutro Abs: 1.1 10*3/uL — ABNORMAL LOW (ref 1.7–7.7)
Neutrophils Relative %: 36 %
Platelet Count: 167 10*3/uL (ref 150–400)
RBC: 5.12 MIL/uL — ABNORMAL HIGH (ref 3.87–5.11)
RDW: 13.6 % (ref 11.5–15.5)
WBC Count: 3.1 10*3/uL — ABNORMAL LOW (ref 4.0–10.5)
nRBC: 0 % (ref 0.0–0.2)

## 2022-06-27 LAB — CMP (CANCER CENTER ONLY)
ALT: 21 U/L (ref 0–44)
AST: 24 U/L (ref 15–41)
Albumin: 4.1 g/dL (ref 3.5–5.0)
Alkaline Phosphatase: 79 U/L (ref 38–126)
Anion gap: 5 (ref 5–15)
BUN: 14 mg/dL (ref 8–23)
CO2: 28 mmol/L (ref 22–32)
Calcium: 9.1 mg/dL (ref 8.9–10.3)
Chloride: 107 mmol/L (ref 98–111)
Creatinine: 0.66 mg/dL (ref 0.44–1.00)
GFR, Estimated: >60 mL/min (ref >60)
Glucose, Bld: 139 mg/dL — ABNORMAL HIGH (ref 70–99)
Potassium: 4.2 mmol/L (ref 3.5–5.1)
Sodium: 140 mmol/L (ref 135–145)
Total Bilirubin: 0.5 mg/dL (ref 0.3–1.2)
Total Protein: 7.3 g/dL (ref 6.5–8.1)

## 2022-06-27 NOTE — Progress Notes (Signed)
[place] Telephone:([phone]) [phone]   Fax:(336) [phone]  PROGRESS NOTE  Patient Care Team: Inda Coke, [place] as PCP - General (Physician Assistant) Nicholes Stairs, MD as Consulting Physician (Orthopedic Surgery) Vania Rea, MD as Consulting Physician (Obstetrics and Gynecology) [facility], [place] as Consulting Physician (Optometry)  Hematological/Oncological History #Leukopenia/Neutropenia: 04/14/2021: WBC 3.9 (L), Hgb 11.3 (L), Plt 185 04/22/2021: WBC 3.2 (L), Hgb 12.7, Plt 229, ANC 1.0 (L) 12/05/2021: WBC 2.6 (L), Hgb 14.7, Plt 188, ANC 1.0 (L)  01/03/2022: Establish care with [facility] Hematology with Dr. Narda Rutherford and Dede Query PA-C  CHIEF COMPLAINTS/PURPOSE OF CONSULTATION:  "Leukopenia/neutropenia "  HISTORY OF PRESENTING ILLNESS:  Lindsey Bray 72 y.o. female returns for a follow up for neutropenia. She is unaccompanied for this visit.  Ms. Shuba reports that she is feeling well. She has good energy levels and she is able to complete her ADLs on her own. She is trying to loose weight to better control her diabetes but is struggling. She denies nausea, vomiting or abdominal pain.  Her bowel habits are unchanged without any recurrent episodes of diarrhea or constipation.  She denies fevers, chills, night sweats, shortness of breath, chest pain or cough.  Has no other complaints.  Rest of the 10 point ROS is below.  MEDICAL HISTORY:  Past Medical History:  Diagnosis Date   Allergy    as a child and young adult   Arthritis    Oa left shoulder   Asthma    as a child and young adult    SURGICAL HISTORY: Past Surgical History:  Procedure Laterality Date   BREAST SURGERY Right 10 (age 20)   benign breast tumors   CESAREAN SECTION     x1   COLONOSCOPY  2004   ~16 yrs ago- was normal    trigger thumb  05/2017   r hand    SOCIAL HISTORY: Social History   Socioeconomic History   Marital status: Single    Spouse name: Not on  file   Number of children: 1   Years of education: Not on file   Highest education level: Not on file  Occupational History   Not on file  Tobacco Use   Smoking status: Never   Smokeless tobacco: Never  Vaping Use   Vaping Use: Never used  Substance and Sexual Activity   Alcohol use: No    Alcohol/week: 0.0 standard drinks of alcohol   Drug use: Never   Sexual activity: Not on file  Other Topics Concern   Not on file  Social History Narrative   Retired   Lives by self   Social Determinants of Health   Financial Resource Strain: Not on file  Food Insecurity: Not on file  Transportation Needs: Not on file  Physical Activity: Not on file  Stress: Not on file  Social Connections: Not on file  Intimate Partner Violence: Not on file    FAMILY HISTORY: Family History  Problem Relation Age of Onset   Cancer Mother 45       colon- died age 41    Colon cancer Mother    COPD Father    Heart disease Father    Cancer Sister 94       breast   Breast cancer Sister    Cancer Sister 47       breast; had a lumpectomy and is on tamoxifen   Breast cancer Sister    Colon polyps Sister  Esophageal cancer Neg Hx    Rectal cancer Neg Hx    Stomach cancer Neg Hx    Diabetes Neg Hx     ALLERGIES:  has No Known Allergies.  MEDICATIONS:  Current Outpatient Medications  Medication Sig Dispense Refill   albuterol (VENTOLIN HFA) 108 (90 Base) MCG/ACT inhaler Inhale 2 puffs into the lungs every 6 (six) hours as needed for wheezing or shortness of breath. 8 g 0   ibuprofen (ADVIL) 200 MG tablet Take 200 mg by mouth every 6 (six) hours as needed for fever, headache or mild pain.     polyethylene glycol (MIRALAX) 17 g packet Take 17 g by mouth daily as needed for mild constipation. 14 each 0   rosuvastatin (CRESTOR) 10 MG tablet Take 1 tablet (10 mg total) by mouth daily. 90 tablet 3   Semaglutide,0.25 or 0.'5MG'$ /DOS, (OZEMPIC, 0.25 OR 0.5 MG/DOSE,) 2 MG/3ML SOPN Inject 0.25 mg into the  skin once a week. 3 mL 0   Continuous Blood Gluc Receiver (DEXCOM G6 RECEIVER) DEVI USE TO MONITOR BLOOD SUGARS (Patient not taking: Reported on 06/27/2022) 1 each 0   Continuous Blood Gluc Sensor (DEXCOM G6 SENSOR) MISC APPLY TO SKIN AND CHANGE EVERY 10 DAYS (Patient not taking: Reported on 06/27/2022) 3 each 2   Continuous Blood Gluc Transmit (DEXCOM G6 TRANSMITTER) MISC USE TO MONITOR BLOOD SUGARS (Patient not taking: Reported on 06/27/2022) 1 each 2   No current facility-administered medications for this visit.    REVIEW OF SYSTEMS:   Constitutional: ( - ) fevers, ( - )  chills , ( - ) night sweats Eyes: ( - ) blurriness of vision, ( - ) double vision, ( - ) watery eyes Ears, nose, mouth, throat, and face: ( - ) mucositis, ( - ) sore throat Respiratory: ( - ) cough, ( - ) dyspnea, ( - ) wheezes Cardiovascular: ( - ) palpitation, ( - ) chest discomfort, ( - ) lower extremity swelling Gastrointestinal:  ( - ) nausea, ( - ) heartburn, ( - ) change in bowel habits Skin: ( - ) abnormal skin rashes Lymphatics: ( - ) new lymphadenopathy, ( - ) easy bruising Neurological: ( - ) numbness, ( - ) tingling, ( - ) new weaknesses Behavioral/Psych: ( - ) mood change, ( - ) new changes  All other systems were reviewed with the patient and are negative.  PHYSICAL EXAMINATION: ECOG PERFORMANCE STATUS: 0 - Asymptomatic  Vitals:   06/27/22 0943  BP: 136/70  Pulse: 79  Resp: 15  Temp: 98.1 F (36.7 C)  SpO2: 100%   Filed Weights   06/27/22 0943  Weight: 145 lb 12.8 oz (66.1 kg)    GENERAL: well appearing female in NAD  SKIN: skin color, texture, turgor are normal, no rashes or significant lesions EYES: conjunctiva are pink and non-injected, sclera clear LUNGS: clear to auscultation and percussion with normal breathing effort HEART: regular rate & rhythm and no murmurs and no lower extremity edema Musculoskeletal: no cyanosis of digits and no clubbing  PSYCH: alert & oriented x 3, fluent  speech NEURO: no focal motor/sensory deficits  LABORATORY DATA:  I have reviewed the data as listed    Latest Ref Rng & Units 06/27/2022    9:31 AM 01/03/2022   10:51 AM 12/05/2021    8:59 AM  CBC  WBC 4.0 - 10.5 K/uL 3.1  3.1  2.6   Hemoglobin 12.0 - 15.0 g/dL 13.7  13.9  14.7   Hematocrit 36.0 -  46.0 % 41.8  42.2  45.8   Platelets 150 - 400 K/uL 167  182  188.0        Latest Ref Rng & Units 06/27/2022    9:31 AM 06/12/2022    8:43 AM 01/03/2022   10:51 AM  CMP  Glucose 70 - 99 mg/dL 139  151  100   BUN 8 - 23 mg/dL '14  11  15   '$ Creatinine 0.44 - 1.00 mg/dL 0.66  0.74  0.75   Sodium 135 - 145 mmol/L 140  144  140   Potassium 3.5 - 5.1 mmol/L 4.2  5.0  3.9   Chloride 98 - 111 mmol/L 107  107  109   CO2 22 - 32 mmol/L '28  28  27   '$ Calcium 8.9 - 10.3 mg/dL 9.1  9.7  9.9   Total Protein 6.5 - 8.1 g/dL 7.3  7.2  7.8   Total Bilirubin 0.3 - 1.2 mg/dL 0.5  0.6  0.6   Alkaline Phos 38 - 126 U/L 79  77  81   AST 15 - 41 U/L '24  23  22   '$ ALT 0 - 44 U/L '21  23  18    '$ ASSESSMENT & PLAN Sarah-Jane A Nikolich is a 72 y.o. female returns for a follow up for neutropenia.   # Leukopenia/Neutropenia  --Neutrophil counts are oscillating. ANC level is 1.1 K/uL today. No other cytopenias.  --Workup from 01/03/2022 was negative for hepatitis B/C, HIV and nutritional deficiencies --Encouraged to take multivitamin and B complex --Recommend to monitor for now and consider further workup if ANC is less than 1.0K/uL.   #Weight management: --Patient is struggling with weight management for her diabetes --Made referral to nutrition and diabetic education.   No orders of the defined types were placed in this encounter.   All questions were answered. The patient knows to call the clinic with any problems, questions or concerns.  I have spent a total of 25 minutes minutes of face-to-face and non-face-to-face time, preparing to see the patient, performing a medically appropriate examination, counseling and  educating the patient, documenting clinical information in the electronic health record, and care coordination.   Dede Query, PA-C Department of Hematology/Oncology [place] at [facility] Phone: [phone]

## 2022-07-04 ENCOUNTER — Other Ambulatory Visit: Payer: HMO

## 2022-07-04 ENCOUNTER — Ambulatory Visit: Payer: HMO | Admitting: Physician Assistant

## 2022-07-08 IMAGING — CT CT ABD-PELV W/ CM
2 of 5 series · 15 of 46 positions shown, 17 images · IV contrast (OMNIPAQUE 350)
Comparison: CT the abdomen and pelvis 11/29/2020.

CLINICAL DATA: 70-year-old female with history of nonlocalized
abdominal pain. Nausea and vomiting.

EXAM:
CT ABDOMEN AND PELVIS WITH CONTRAST
TECHNIQUE: Multidetector CT imaging of the abdomen and pelvis was performed
using the standard protocol following bolus administration of
intravenous contrast.
CONTRAST:  80mL OMNIPAQUE IOHEXOL 350 MG/ML SOLN

[Series 2: axial st · axial · 0.72mm/px · z∈[+1057,+1367]mm · 12 of 74 slices shown, 14 images]
[im 6/74  soft-tissue]
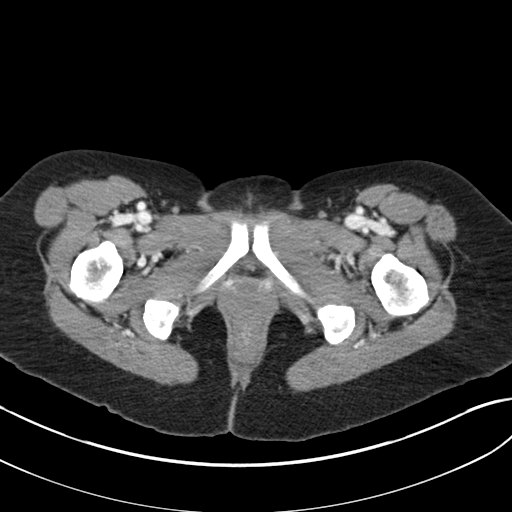
[im 6/74  bone]
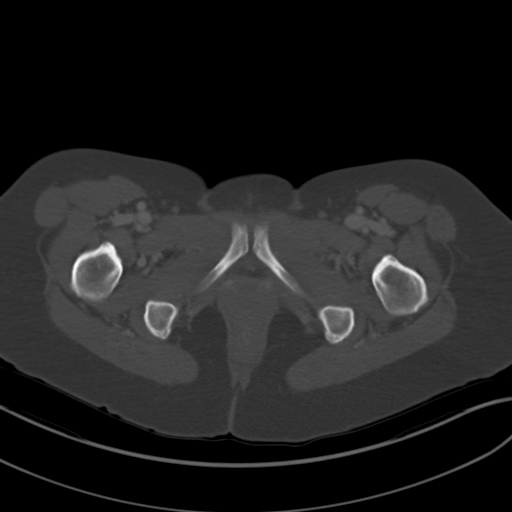
[im 11/74  soft-tissue]
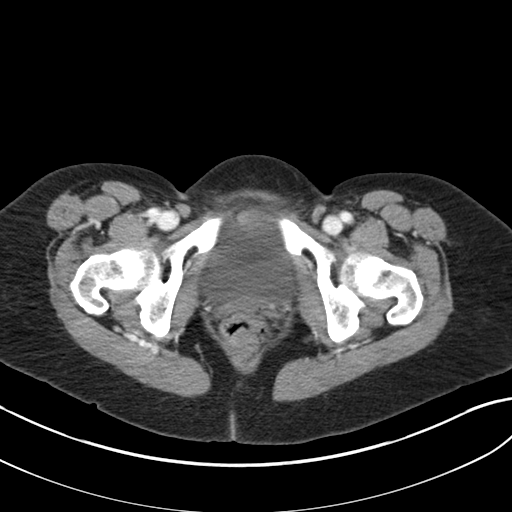
[im 16/74  soft-tissue]
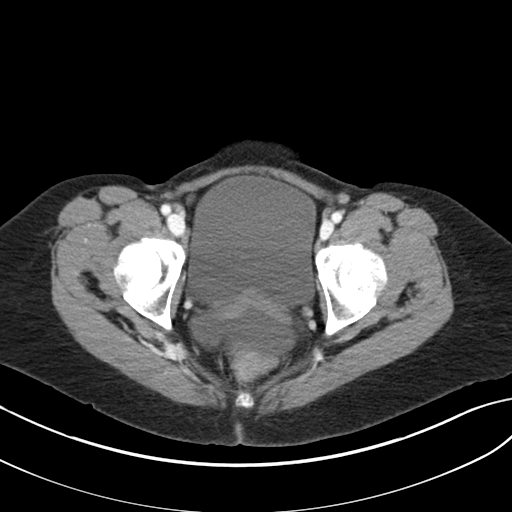
[im 21/74  soft-tissue]
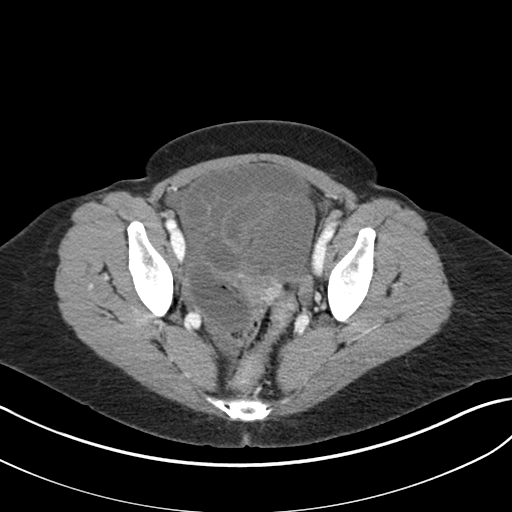
[im 27/74  soft-tissue]
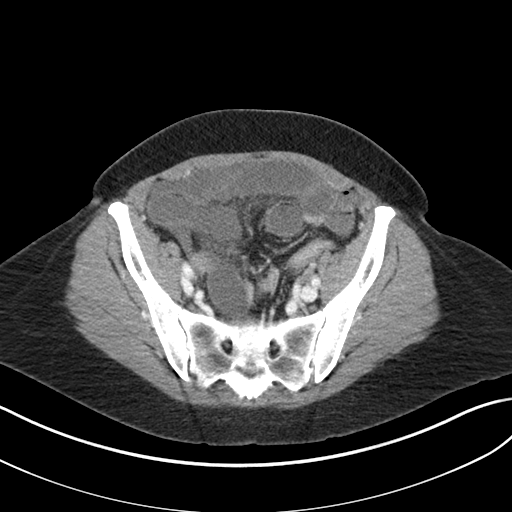
[im 32/74  soft-tissue]
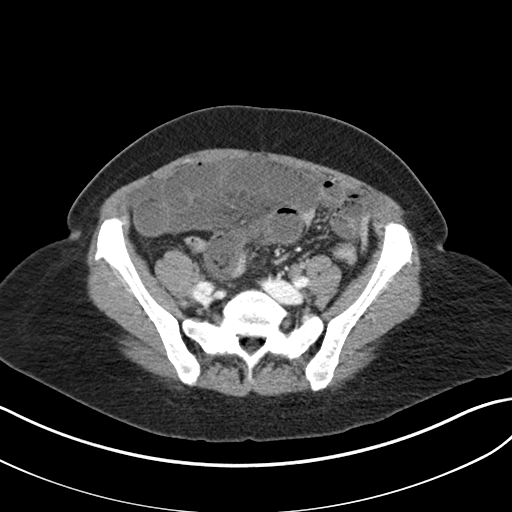
[im 42/74  soft-tissue]
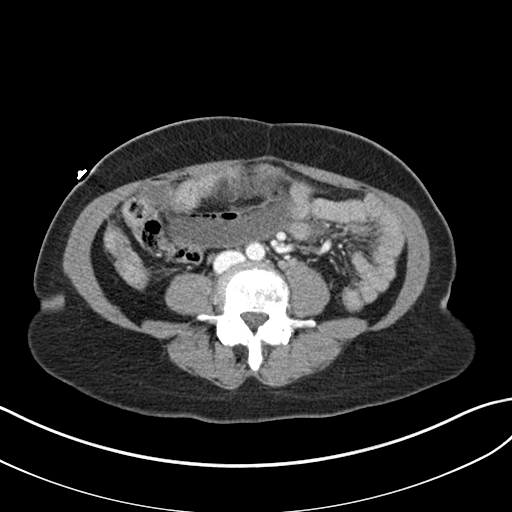
[im 47/74  soft-tissue]
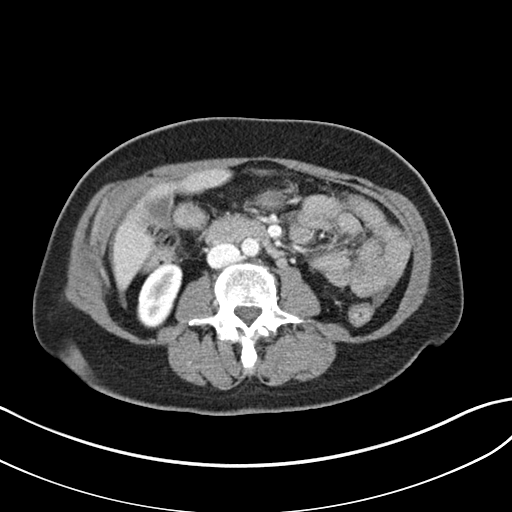
[im 53/74  soft-tissue]
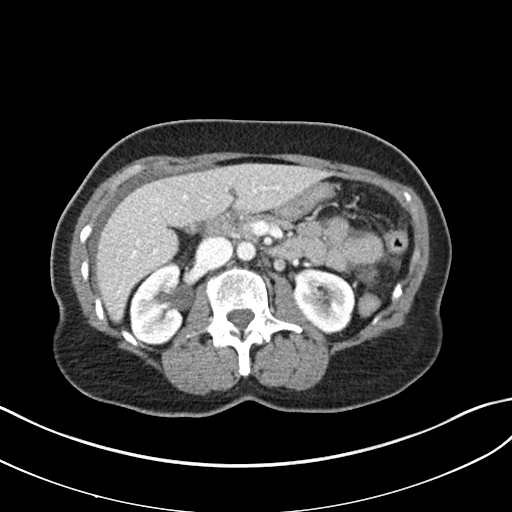
[im 53/74  bone]
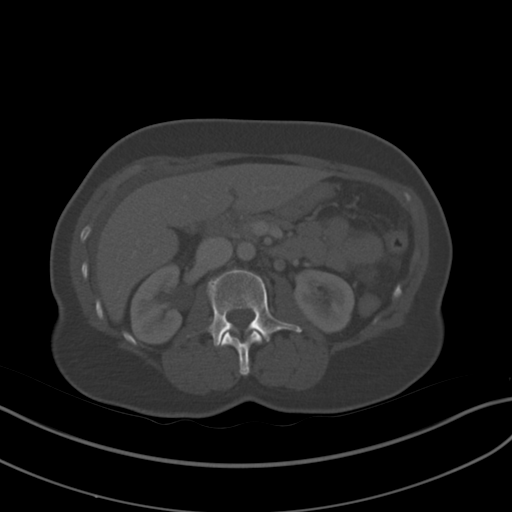
[im 58/74  soft-tissue]
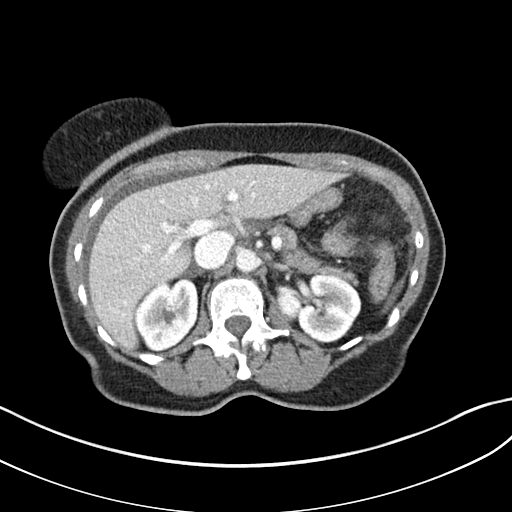
[im 63/74  soft-tissue]
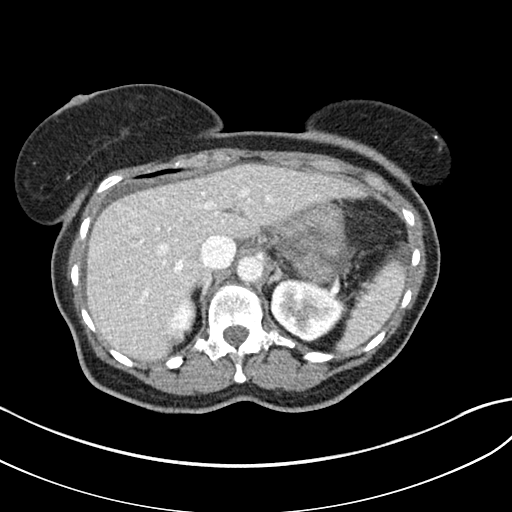
[im 68/74  soft-tissue]
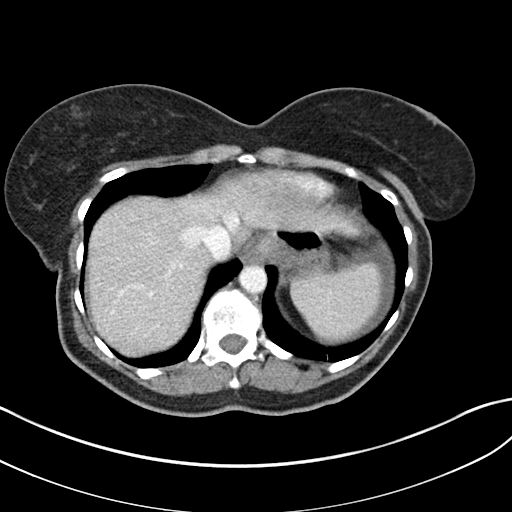

[Series 5: coronal st · coronal · 0.64mm/px · 3 of 125 slices shown]
[im 42/125  soft-tissue]
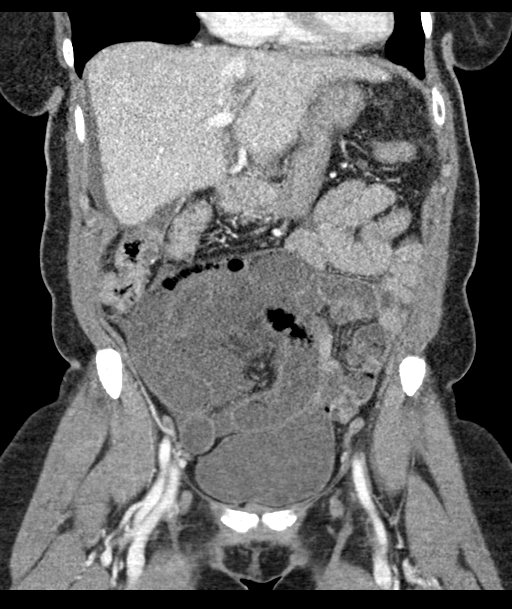
[im 56/125  soft-tissue]
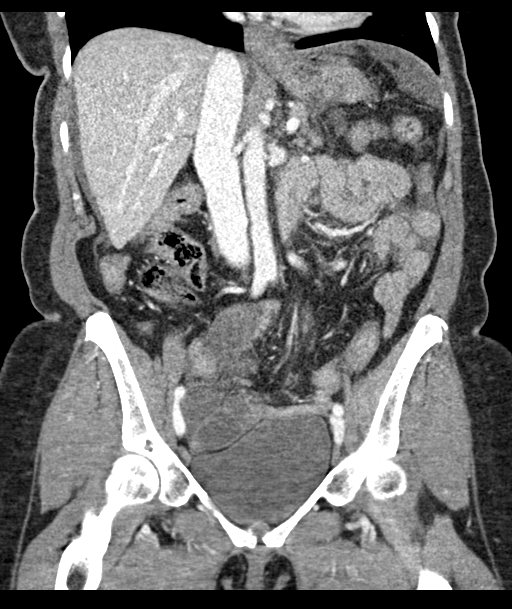
[im 69/125  soft-tissue]
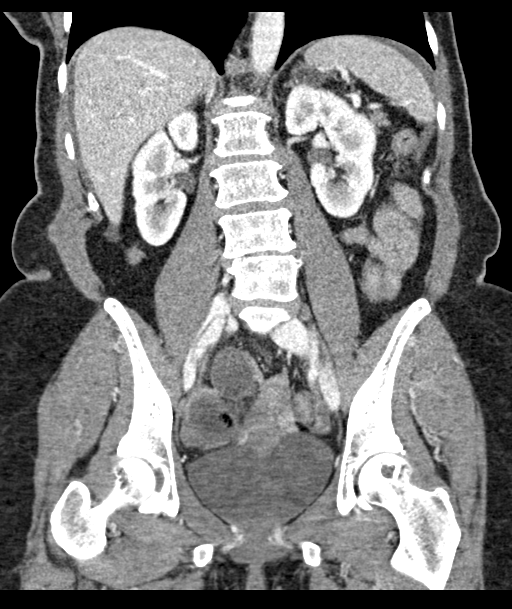

[15 of 46 positions shown; findings below may reference images not displayed]

FINDINGS: Lower chest: Unremarkable.

Hepatobiliary: In segment 4B of the liver adjacent to the falciform
ligament there is a 1.1 cm low attenuation area, favored to
represent a benign perfusion anomaly or some focal fatty
infiltration. No other suspicious cystic or solid hepatic lesions.
No intra or extrahepatic biliary ductal dilatation. Gallbladder is
normal in appearance.

Pancreas: No pancreatic mass. No pancreatic ductal dilatation. No
pancreatic or peripancreatic fluid collections or inflammatory
changes.

Spleen: Unremarkable.

Adrenals/Urinary Tract: Bilateral kidneys and adrenal glands are
normal in appearance. No hydroureteronephrosis. Urinary bladder is
normal in appearance.

Stomach/Bowel: Stomach is normal in appearance. There are multiple
prominent borderline dilated loops of mid to distal small bowel
measuring up to 2.9 cm in diameter, which contains some air-fluid
levels, and demonstrate thin poorly enhancing bowel wall. Proximal
small bowel and stomach are completely decompressed. Distal and
terminal ileum are also completely decompressed. Paucity of gas and
stool noted in the colon. Normal appendix.

Vascular/Lymphatic: Minimal atherosclerosis in the abdominal and
pelvic vasculature. Celiac axis, superior mesenteric artery and
inferior mesenteric artery all appear grossly patent at this time.
No lymphadenopathy noted in the abdomen or pelvis.

Reproductive: Uterus and ovaries are unremarkable in appearance.

Other: Small volume of ascites.  No pneumoperitoneum.

Musculoskeletal: There are no aggressive appearing lytic or blastic
lesions noted in the visualized portions of the skeleton.
IMPRESSION: 1. Borderline dilated loops of mid to distal small bowel with
multiple air-fluid levels, and thin poorly enhancing bowel wall,
highly concerning for acute bowel ischemia. Given the gross patency
of the major mesenteric vessels, and the overall appearance, the
possibility of a closed loop small-bowel obstruction with associated
bowel ischemia should be considered. Surgical consultation is
strongly recommended at this time.
2. Small volume of ascites.
3. Atherosclerosis.

Critical Value/emergent results were called by telephone at the time
of interpretation on 04/10/2021 at [DATE] to provider SHIIRABU AIZAM
, who verbally acknowledged these results.

## 2022-07-09 IMAGING — DX DG ABD PORTABLE 1V
1 series · 1 of 1 positions shown · non-contrast
Comparison: Abdominal radiograph dated 04/10/2021.

CLINICAL DATA: Small bowel protocol.

EXAM:
PORTABLE ABDOMEN - 1 VIEW

[abdomen kub]
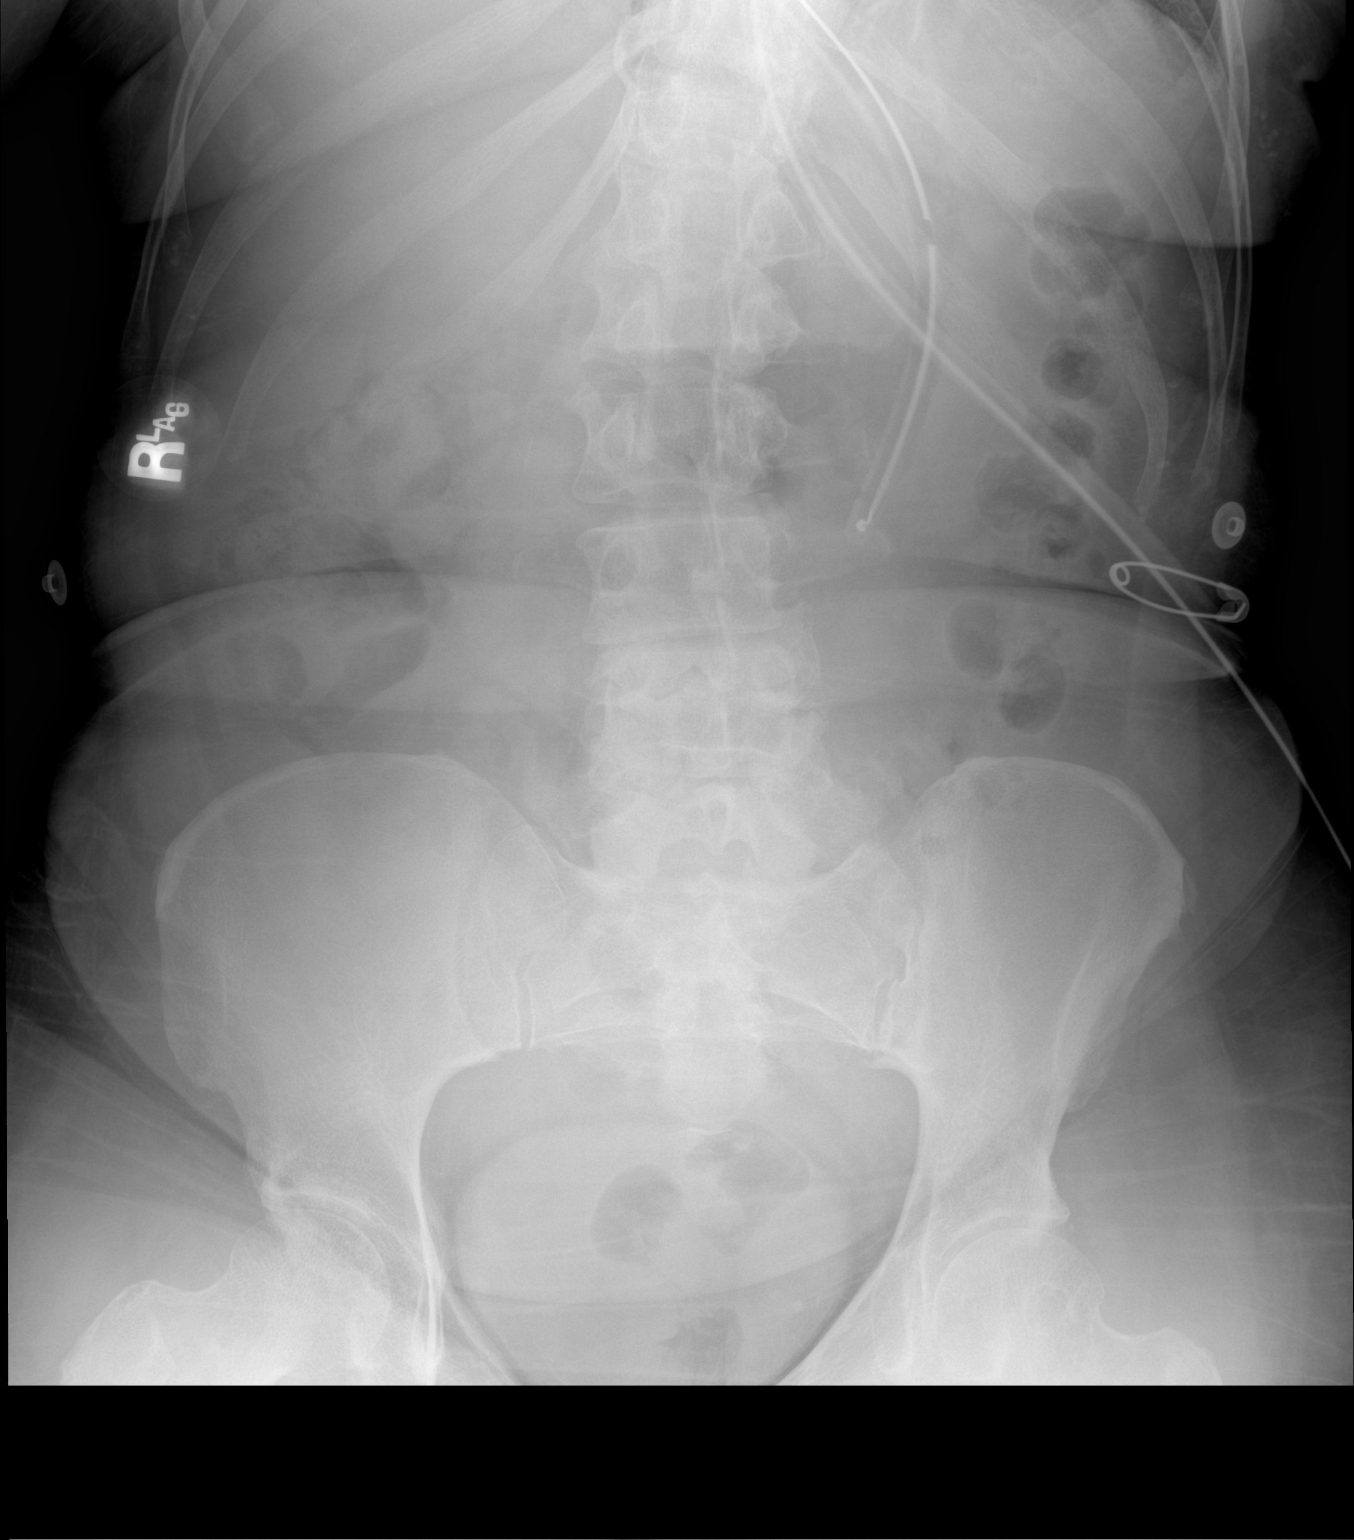

[1 of 1 positions shown; findings below may reference images not displayed]

FINDINGS: Enteric tube with tip in similar position. There is no bowel
dilatation or evidence of obstruction. The no free air. No free air.
The osseous structures are intact. The soft tissues are
unremarkable.
IMPRESSION: No bowel obstruction.

## 2022-07-10 IMAGING — DX DG ABD PORTABLE 1V
1 series · 1 of 1 positions shown · non-contrast
Comparison: 04/11/2021

CLINICAL DATA: Abdominal pain

EXAM:
PORTABLE ABDOMEN - 1 VIEW

[abdomen kub]
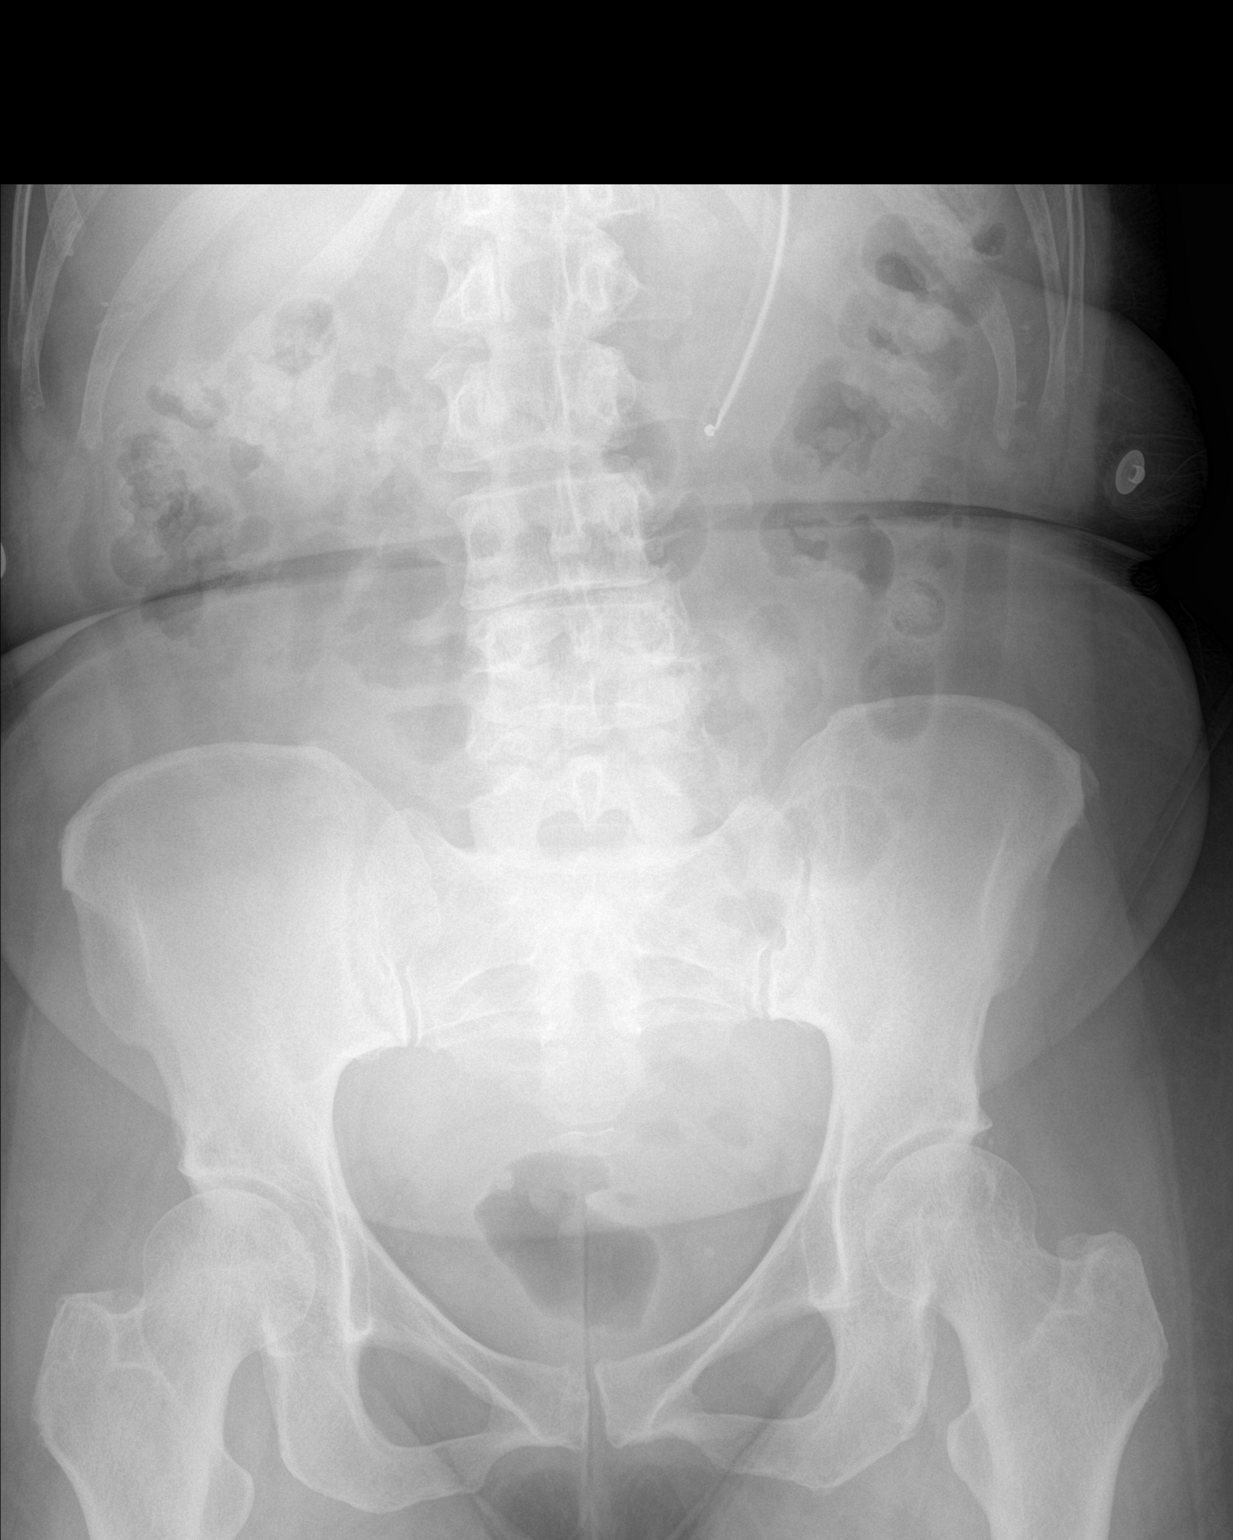

[1 of 1 positions shown; findings below may reference images not displayed]

FINDINGS: Bowel gas pattern is nonspecific. Small to moderate amount of stool
is seen in the colon without signs of fecal impaction in the
rectosigmoid. No abnormal masses or calcifications are seen. Kidneys
are partly obscured by bowel contents. Tip of enteric tube is seen
in the region of body of the stomach. Degenerative changes are noted
in the lumbar spine with disc space narrowing, bony spurs and facet
hypertrophy.
IMPRESSION: Nonspecific bowel gas pattern.

## 2022-07-18 ENCOUNTER — Encounter: Payer: Self-pay | Admitting: Physician Assistant

## 2022-07-18 MED ORDER — OZEMPIC (0.25 OR 0.5 MG/DOSE) 2 MG/3ML ~~LOC~~ SOPN: 0.2500 mg | PEN_INJECTOR | SUBCUTANEOUS | 2 refills | Status: DC

## 2022-07-18 NOTE — Telephone Encounter (Signed)
Pt would like to continue Ozempic. Please advise if you want pt to continue same dose 0.25 mg once a week?

## 2022-07-23 NOTE — Telephone Encounter (Signed)
Pt states pharmacy told her she needed PA for RX for Ozempic. Please advise.

## 2022-07-30 ENCOUNTER — Telehealth: Payer: Self-pay | Admitting: *Deleted

## 2022-07-30 ENCOUNTER — Other Ambulatory Visit: Payer: Self-pay | Admitting: *Deleted

## 2022-07-30 NOTE — Telephone Encounter (Signed)
Called today # [id number] 1992 to start [place] [phone] Requesting  last note and lab results to be fax to [phone] Results and note faxed  Waiting for determination

## 2022-08-01 ENCOUNTER — Telehealth: Payer: Self-pay

## 2022-08-01 NOTE — Telephone Encounter (Signed)
Patient Advocate Encounter  Prior Authorization for Ozempic has been approved.    PA# [id number] Effective dates: 07/30/22 through 07/30/23

## 2022-08-04 NOTE — Telephone Encounter (Signed)
Called [facility] spoke to Isabel told her PA for Ozempic has been approved for one year. Seth Bake verbalized understanding and said pt picked up on 3/27. Told her okay.

## 2022-08-11 ENCOUNTER — Telehealth: Payer: Self-pay | Admitting: Physician Assistant

## 2022-08-11 NOTE — Telephone Encounter (Signed)
Contacted Lindsey Bray to schedule their annual wellness visit. Appointment made for 08/19/2022.  Gabriel Cirri [facility] TEAM Direct Dial [phone]

## 2022-08-19 ENCOUNTER — Ambulatory Visit (INDEPENDENT_AMBULATORY_CARE_PROVIDER_SITE_OTHER): Payer: HMO

## 2022-08-19 VITALS — BP 120/74 | HR 75 | Temp 98.9°F | Wt 144.4 lb

## 2022-08-19 DIAGNOSIS — Z0000 Encounter for general adult medical examination without abnormal findings: Secondary | ICD-10-CM

## 2022-08-19 NOTE — Patient Instructions (Signed)
Lindsey Bray , Thank you for taking time to come for your Medicare Wellness Visit. I appreciate your ongoing commitment to your health goals. Please review the following plan we discussed and let me know if I can assist you in the future.   These are the goals we discussed:  Goals      Weight (lb) < 200 lb (90.7 kg)     Loose 10 pounds        This is a list of the screening recommended for you and due dates:  Health Maintenance  Topic Date Due   DTaP/Tdap/Td vaccine (1 - Tdap) Never done   Zoster (Shingles) Vaccine (1 of 2) Never done   Medicare Annual Wellness Visit  09/23/2019   COVID-19 Vaccine (7 - 2023-24 season) 06/13/2023*   Yearly kidney health urinalysis for diabetes  12/06/2022   Hemoglobin A1C  12/11/2022   Eye exam for diabetics  01/16/2023   Complete foot exam   03/13/2023   Yearly kidney function blood test for diabetes  06/28/2023   Mammogram  01/04/2024   Colon Cancer Screening  12/21/2025   Pneumonia Vaccine  Completed   DEXA scan (bone density measurement)  Completed   Hepatitis C Screening: USPSTF Recommendation to screen - Ages 64-79 yo.  Completed   HPV Vaccine  Aged Out   Flu Shot  Discontinued  *Topic was postponed. The date shown is not the original due date.    Advanced directives: Advance directive discussed with you today. Even though you declined this today please call our office should you change your mind and we can give you the proper paperwork for you to fill out.  Conditions/risks identified: get off some of these medication   Next appointment: Follow up in one year for your annual wellness visit    Preventive Care 65 Years and Older, Female Preventive care refers to lifestyle choices and visits with your health care provider that can promote health and wellness. What does preventive care include? A yearly physical exam. This is also called an annual well check. Dental exams once or twice a year. Routine eye exams. Ask your health care  provider how often you should have your eyes checked. Personal lifestyle choices, including: Daily care of your teeth and gums. Regular physical activity. Eating a healthy diet. Avoiding tobacco and drug use. Limiting alcohol use. Practicing safe sex. Taking low-dose aspirin every day. Taking vitamin and mineral supplements as recommended by your health care provider. What happens during an annual well check? The services and screenings done by your health care provider during your annual well check will depend on your age, overall health, lifestyle risk factors, and family history of disease. Counseling  Your health care provider may ask you questions about your: Alcohol use. Tobacco use. Drug use. Emotional well-being. Home and relationship well-being. Sexual activity. Eating habits. History of falls. Memory and ability to understand (cognition). Work and work Astronomer. Reproductive health. Screening  You may have the following tests or measurements: Height, weight, and BMI. Blood pressure. Lipid and cholesterol levels. These may be checked every 5 years, or more frequently if you are over 48 years old. Skin check. Lung cancer screening. You may have this screening every year starting at age 73 if you have a 30-pack-year history of smoking and currently smoke or have quit within the past 15 years. Fecal occult blood test (FOBT) of the stool. You may have this test every year starting at age 88. Flexible sigmoidoscopy or colonoscopy. You  may have a sigmoidoscopy every 5 years or a colonoscopy every 10 years starting at age 39. Hepatitis C blood test. Hepatitis B blood test. Sexually transmitted disease (STD) testing. Diabetes screening. This is done by checking your blood sugar (glucose) after you have not eaten for a while (fasting). You may have this done every 1-3 years. Bone density scan. This is done to screen for osteoporosis. You may have this done starting at age  60. Mammogram. This may be done every 1-2 years. Talk to your health care provider about how often you should have regular mammograms. Talk with your health care provider about your test results, treatment options, and if necessary, the need for more tests. Vaccines  Your health care provider may recommend certain vaccines, such as: Influenza vaccine. This is recommended every year. Tetanus, diphtheria, and acellular pertussis (Tdap, Td) vaccine. You may need a Td booster every 10 years. Zoster vaccine. You may need this after age 40. Pneumococcal 13-valent conjugate (PCV13) vaccine. One dose is recommended after age 8. Pneumococcal polysaccharide (PPSV23) vaccine. One dose is recommended after age 32. Talk to your health care provider about which screenings and vaccines you need and how often you need them. This information is not intended to replace advice given to you by your health care provider. Make sure you discuss any questions you have with your health care provider. Document Released: 05/18/2015 Document Revised: 01/09/2016 Document Reviewed: 02/20/2015 Elsevier Interactive Patient Education  2017 [employer].  Fall Prevention in the Home Falls can cause injuries. They can happen to people of all ages. There are many things you can do to make your home safe and to help prevent falls. What can I do on the outside of my home? Regularly fix the edges of walkways and driveways and fix any cracks. Remove anything that might make you trip as you walk through a door, such as a raised step or threshold. Trim any bushes or trees on the path to your home. Use bright outdoor lighting. Clear any walking paths of anything that might make someone trip, such as rocks or tools. Regularly check to see if handrails are loose or broken. Make sure that both sides of any steps have handrails. Any raised decks and porches should have guardrails on the edges. Have any leaves, snow, or ice cleared  regularly. Use sand or salt on walking paths during winter. Clean up any spills in your garage right away. This includes oil or grease spills. What can I do in the bathroom? Use night lights. Install grab bars by the toilet and in the tub and shower. Do not use towel bars as grab bars. Use non-skid mats or decals in the tub or shower. If you need to sit down in the shower, use a plastic, non-slip stool. Keep the floor dry. Clean up any water that spills on the floor as soon as it happens. Remove soap buildup in the tub or shower regularly. Attach bath mats securely with double-sided non-slip rug tape. Do not have throw rugs and other things on the floor that can make you trip. What can I do in the bedroom? Use night lights. Make sure that you have a light by your bed that is easy to reach. Do not use any sheets or blankets that are too big for your bed. They should not hang down onto the floor. Have a firm chair that has side arms. You can use this for support while you get dressed. Do not have throw  rugs and other things on the floor that can make you trip. What can I do in the kitchen? Clean up any spills right away. Avoid walking on wet floors. Keep items that you use a lot in easy-to-reach places. If you need to reach something above you, use a strong step stool that has a grab bar. Keep electrical cords out of the way. Do not use floor polish or wax that makes floors slippery. If you must use wax, use non-skid floor wax. Do not have throw rugs and other things on the floor that can make you trip. What can I do with my stairs? Do not leave any items on the stairs. Make sure that there are handrails on both sides of the stairs and use them. Fix handrails that are broken or loose. Make sure that handrails are as long as the stairways. Check any carpeting to make sure that it is firmly attached to the stairs. Fix any carpet that is loose or worn. Avoid having throw rugs at the top or  bottom of the stairs. If you do have throw rugs, attach them to the floor with carpet tape. Make sure that you have a light switch at the top of the stairs and the bottom of the stairs. If you do not have them, ask someone to add them for you. What else can I do to help prevent falls? Wear shoes that: Do not have high heels. Have rubber bottoms. Are comfortable and fit you well. Are closed at the toe. Do not wear sandals. If you use a stepladder: Make sure that it is fully opened. Do not climb a closed stepladder. Make sure that both sides of the stepladder are locked into place. Ask someone to hold it for you, if possible. Clearly mark and make sure that you can see: Any grab bars or handrails. First and last steps. Where the edge of each step is. Use tools that help you move around (mobility aids) if they are needed. These include: Canes. Walkers. Scooters. Crutches. Turn on the lights when you go into a dark area. Replace any light bulbs as soon as they burn out. Set up your furniture so you have a clear path. Avoid moving your furniture around. If any of your floors are uneven, fix them. If there are any pets around you, be aware of where they are. Review your medicines with your doctor. Some medicines can make you feel dizzy. This can increase your chance of falling. Ask your doctor what other things that you can do to help prevent falls. This information is not intended to replace advice given to you by your health care provider. Make sure you discuss any questions you have with your health care provider. Document Released: 02/15/2009 Document Revised: 09/27/2015 Document Reviewed: 05/26/2014 Elsevier Interactive Patient Education  2017 [employer].

## 2022-08-19 NOTE — Progress Notes (Signed)
Subjective:   Lindsey Bray is a 73 y.o. female who presents for Medicare Annual (Subsequent) preventive examination.     Patient Medicare AWV questionnaire was completed by the patient on 08/18/22; I have confirmed that all information answered by patient is correct and no changes since this date.     Review of Systems     Cardiac Risk Factors include: advanced age (>46men, >70 women);dyslipidemia     Objective:    Today's Vitals   08/19/22 0801  BP: 120/74  Pulse: 75  Temp: 98.9 F (37.2 C)  SpO2: 99%  Weight: 144 lb 6.4 oz (65.5 kg)   Body mass index is 29.17 kg/m.     08/19/2022    8:14 AM 04/10/2021    3:22 AM 10/30/2020   11:32 AM 12/22/2018   11:12 AM 09/23/2018   11:05 AM 10/27/2015    9:20 PM  Advanced Directives  Does Patient Have a Medical Advance Directive? No No Yes Yes Yes No  Type of Advance Directive   Living will;Healthcare Power of [employer] Power of [place];Living will Healthcare Power of [place];Living will   Does patient want to make changes to medical advance directive?     No - Patient declined   Copy of Healthcare Power of Attorney in Chart?     No - copy requested   Would patient like information on creating a medical advance directive? No - Patient declined No - Patient declined    No - patient declined information    Current Medications (verified) Outpatient Encounter Medications as of 08/19/2022  Medication Sig   albuterol (VENTOLIN HFA) 108 (90 Base) MCG/ACT inhaler Inhale 2 puffs into the lungs every 6 (six) hours as needed for wheezing or shortness of breath.   ibuprofen (ADVIL) 200 MG tablet Take 200 mg by mouth every 6 (six) hours as needed for fever, headache or mild pain.   rosuvastatin (CRESTOR) 10 MG tablet Take 1 tablet (10 mg total) by mouth daily.   Semaglutide,0.25 or 0.5MG /DOS, (OZEMPIC, 0.25 OR 0.5 MG/DOSE,) 2 MG/3ML SOPN Inject 0.25 mg into the skin once a week.   Continuous Blood Gluc Sensor (DEXCOM G6 SENSOR)  MISC APPLY TO SKIN AND CHANGE EVERY 10 DAYS (Patient not taking: Reported on 06/27/2022)   polyethylene glycol (MIRALAX) 17 g packet Take 17 g by mouth daily as needed for mild constipation. (Patient not taking: Reported on 08/19/2022)   [DISCONTINUED] Continuous Blood Gluc Receiver (DEXCOM G6 RECEIVER) DEVI USE TO MONITOR BLOOD SUGARS (Patient not taking: Reported on 06/27/2022)   [DISCONTINUED] Continuous Blood Gluc Transmit (DEXCOM G6 TRANSMITTER) MISC USE TO MONITOR BLOOD SUGARS (Patient not taking: Reported on 06/27/2022)   [DISCONTINUED] Semaglutide,0.25 or 0.5MG /DOS, (OZEMPIC, 0.25 OR 0.5 MG/DOSE,) 2 MG/3ML SOPN Inject 0.25 mg into the skin once a week.   No facility-administered encounter medications on file as of 08/19/2022.    Allergies (verified) Patient has no known allergies.   History: Past Medical History:  Diagnosis Date   Allergy    as a child and young adult   Arthritis    Oa left shoulder   Asthma    as a child and young adult   Past Surgical History:  Procedure Laterality Date   BREAST SURGERY Right 33 (age 17)   benign breast tumors   CESAREAN SECTION     x1   COLONOSCOPY  2004   ~16 yrs ago- was normal    trigger thumb  05/2017   r hand  Family History  Problem Relation Age of Onset   Cancer Mother 66       colon- died age 28    Colon cancer Mother    COPD Father    Heart disease Father    Cancer Sister 85       breast   Breast cancer Sister    Cancer Sister 73       breast; had a lumpectomy and is on tamoxifen   Breast cancer Sister    Colon polyps Sister    Esophageal cancer Neg Hx    Rectal cancer Neg Hx    Stomach cancer Neg Hx    Diabetes Neg Hx    Social History   Socioeconomic History   Marital status: Single    Spouse name: Not on file   Number of children: 1   Years of education: Not on file   Highest education level: Not on file  Occupational History   Not on file  Tobacco Use   Smoking status: Never   Smokeless tobacco:  Never  Vaping Use   Vaping Use: Never used  Substance and Sexual Activity   Alcohol use: No    Alcohol/week: 0.0 standard drinks of alcohol   Drug use: Never   Sexual activity: Not on file  Other Topics Concern   Not on file  Social History Narrative   Retired   Lives by self   Social Determinants of Health   Financial Resource Strain: Low Risk  (08/18/2022)   Overall Financial Resource Strain (CARDIA)    Difficulty of Paying Living Expenses: Not very hard  Food Insecurity: No Food Insecurity (08/18/2022)   Hunger Vital Sign    Worried About Running Out of Food in the Last Year: Never true    Ran Out of Food in the Last Year: Never true  Transportation Needs: No Transportation Needs (08/18/2022)   PRAPARE - Administrator, Civil Service (Medical): No    Lack of Transportation (Non-Medical): No  Physical Activity: Sufficiently Active (08/18/2022)   Exercise Vital Sign    Days of Exercise per Week: 4 days    Minutes of Exercise per Session: 60 min  Stress: No Stress Concern Present (08/18/2022)   [employer] of Occupational Health - Occupational Stress Questionnaire    Feeling of Stress : Not at all  Social Connections: Moderately Integrated (08/18/2022)   Social Connection and Isolation Panel [NHANES]    Frequency of Communication with Friends and Family: More than three times a week    Frequency of Social Gatherings with Friends and Family: More than three times a week    Attends Religious Services: More than 4 times per year    Active Member of [employer] or Organizations: Yes    Attends Engineer, structural: More than 4 times per year    Marital Status: Never married    Tobacco Counseling Counseling given: Not Answered   Clinical Intake:  Pre-visit preparation completed: Yes  Pain : No/denies pain     BMI - recorded: 29.17 Nutritional Status: BMI 25 -29 Overweight Nutritional Risks: None Diabetes: Yes CBG done?: Yes (per pt 129) CBG  resulted in Enter/ Edit results?: No Did pt. bring in CBG monitor from home?: No  How often do you need to have someone help you when you read instructions, pamphlets, or other written materials from your doctor or pharmacy?: (P) 1 - Never  Diabetic?Nutrition Risk Assessment:  Has the patient had any N/V/D within the  last 2 months?  No  Does the patient have any non-healing wounds?  No  Has the patient had any unintentional weight loss or weight gain?  No   Diabetes:  Is the patient diabetic?  Yes  If diabetic, was a CBG obtained today?  Yes  Did the patient bring in their glucometer from home?  No  How often do you monitor your CBG's? daily.   Financial Strains and Diabetes Management:  Are you having any financial strains with the device, your supplies or your medication? No .  Does the patient want to be seen by Chronic Care Management for management of their diabetes?  No  Would the patient like to be referred to a Nutritionist or for Diabetic Management?  Yes   Diabetic Exams:  Diabetic Eye Exam: Completed 01/15/22 Diabetic Foot Exam: Completed 03/12/22  Interpreter Needed?: No  Information entered by :: Lanier Ensign, LPN   Activities of Daily Living    08/18/2022    6:41 AM  In your present state of health, do you have any difficulty performing the following activities:  Hearing? 0  Vision? 0  Difficulty concentrating or making decisions? 0  Walking or climbing stairs? 0  Dressing or bathing? 0  Doing errands, shopping? 0  Preparing Food and eating ? N  Using the Toilet? N  In the past six months, have you accidently leaked urine? N  Do you have problems with loss of bowel control? N  Managing your Medications? N  Managing your Finances? N  Housekeeping or managing your Housekeeping? N    Patient Care Team: Jarold Motto, [place] as PCP - General (Physician Assistant) Yolonda Kida, MD as Consulting Physician (Orthopedic Surgery) Annamaria Helling, MD as  Consulting Physician (Obstetrics and Gynecology) [facility], [place] as Consulting Physician (Optometry)  Indicate any recent Medical Services you may have received from other than [facility] providers in the past year (date may be approximate).     Assessment:   This is a routine wellness examination for Lindsey Bray.  Hearing/Vision screen Hearing Screening - Comments:: Pt denies any hearing issues  Vision Screening - Comments:: Pt follows up with [facility] for annual eye exams   Dietary issues and exercise activities discussed: Current Exercise Habits: Home exercise routine, Type of exercise: walking;Other - see comments, Time (Minutes): 60, Frequency (Times/Week): 4, Weekly Exercise (Minutes/Week): 240   Goals Addressed             This Visit's Progress    Patient Stated       Get off medications        Depression Screen    08/19/2022    8:11 AM 06/12/2022    8:08 AM 12/05/2021    8:07 AM 12/26/2020    8:45 AM 11/06/2020    9:25 AM 09/25/2020    9:50 AM 06/25/2020    4:50 PM  PHQ 2/9 Scores  PHQ - 2 Score 0 0 0 0 0 0 0  PHQ- 9 Score   0 0 0 0     Fall Risk    08/18/2022    6:41 AM 12/05/2021    8:07 AM 04/22/2021    1:23 PM 12/26/2020    8:45 AM 09/25/2020    9:50 AM  Fall Risk   Falls in the past year? 0 0 0 0 0  Number falls in past yr: 0 0 0 0 0  Injury with Fall? 0 0 0 0 0  Risk for fall due  to : Impaired vision No Fall Risks  No Fall Risks   Follow up Falls prevention discussed Falls evaluation completed Falls evaluation completed Falls evaluation completed     FALL RISK PREVENTION PERTAINING TO THE HOME:  Any stairs in or around the home? Yes  If so, are there any without handrails? No  Home free of loose throw rugs in walkways, pet beds, electrical cords, etc? Yes  Adequate lighting in your home to reduce risk of falls? Yes   ASSISTIVE DEVICES UTILIZED TO PREVENT FALLS:  Life alert? No  Use of a cane, walker or w/c? No  Grab bars in the bathroom? No   Shower chair or bench in shower? Yes  Elevated toilet seat or a handicapped toilet? No   TIMED UP AND GO:  Was the test performed? Yes .  Length of time to ambulate 10 feet: 10 sec.   Gait steady and fast without use of assistive device  Cognitive Function:        08/19/2022    8:17 AM 09/23/2018   11:07 AM  6CIT Screen  What Year? 0 points 0 points  What month? 0 points 0 points  What time? 0 points 0 points  Count back from 20 0 points 0 points  Months in reverse 0 points 0 points  Repeat phrase 0 points 0 points  Total Score 0 points 0 points    Immunizations Immunization History  Administered Date(s) Administered   Influenza,inj,Quad PF,6+ Mos 04/06/2016   PFIZER(Purple Top)SARS-COV-2 Vaccination 05/27/2019, 06/17/2019, 02/06/2020, 08/14/2020, 02/01/2022   Pfizer Covid-19 Vaccine Bivalent Booster 7yrs & up 10/08/2021   Pneumococcal Conjugate-13 11/29/2018   Pneumococcal Polysaccharide-23 04/11/2020    TDAP status: Due, Education has been provided regarding the importance of this vaccine. Advised may receive this vaccine at local pharmacy or Health Dept. Aware to provide a copy of the vaccination record if obtained from local pharmacy or Health Dept. Verbalized acceptance and understanding.  Flu Vaccine status: Declined, Education has been provided regarding the importance of this vaccine but patient still declined. Advised may receive this vaccine at local pharmacy or Health Dept. Aware to provide a copy of the vaccination record if obtained from local pharmacy or Health Dept. Verbalized acceptance and understanding.  Pneumococcal vaccine status: Up to date  Covid-19 vaccine status: Completed vaccines  Qualifies for Shingles Vaccine? Yes   Zostavax completed No   Shingrix Completed?: No.    Education has been provided regarding the importance of this vaccine. Patient has been advised to call insurance company to determine out of pocket expense if they have not yet  received this vaccine. Advised may also receive vaccine at local pharmacy or Health Dept. Verbalized acceptance and understanding.  Screening Tests Health Maintenance  Topic Date Due   DTaP/Tdap/Td (1 - Tdap) Never done   Zoster Vaccines- Shingrix (1 of 2) 11/18/2022 (Originally 09/15/1969)   COVID-19 Vaccine (7 - 2023-24 season) 06/13/2023 (Originally 03/29/2022)   Diabetic kidney evaluation - Urine ACR  12/06/2022   HEMOGLOBIN A1C  12/11/2022   OPHTHALMOLOGY EXAM  01/16/2023   FOOT EXAM  03/13/2023   Diabetic kidney evaluation - eGFR measurement  06/28/2023   Medicare Annual Wellness (AWV)  08/19/2023   MAMMOGRAM  01/04/2024   COLONOSCOPY (Pts 45-69yrs Insurance coverage will need to be confirmed)  12/21/2025   Pneumonia Vaccine 30+ Years old  Completed   DEXA SCAN  Completed   Hepatitis C Screening  Completed   HPV VACCINES  Aged Out  INFLUENZA VACCINE  Discontinued    Health Maintenance  Health Maintenance Due  Topic Date Due   DTaP/Tdap/Td (1 - Tdap) Never done    Colorectal cancer screening: Type of screening: Colonoscopy. Completed 12/22/18. Repeat every 7 years  Mammogram status: Completed 01/03/22. Repeat every year  Bone Density status: Completed 11/19/18. Results reflect: Bone density results: NORMAL. Repeat every 2 years.   Additional Screening:  Hepatitis C Screening:  Completed 01/03/22  Vision Screening: Recommended annual ophthalmology exams for early detection of glaucoma and other disorders of the eye. Is the patient up to date with their annual eye exam?  Yes  Who is the provider or what is the name of the office in which the patient attends annual eye exams? [facility]  If pt is not established with a provider, would they like to be referred to a provider to establish care? No .   Dental Screening: Recommended annual dental exams for proper oral hygiene  Community Resource Referral / Chronic Care Management: CRR required this visit?  No   CCM required this  visit?  No      Plan:     I have personally reviewed and noted the following in the patient's chart:   Medical and social history Use of alcohol, tobacco or illicit drugs  Current medications and supplements including opioid prescriptions. Patient is not currently taking opioid prescriptions. Functional ability and status Nutritional status Physical activity Advanced directives List of other physicians Hospitalizations, surgeries, and ER visits in previous 12 months Vitals Screenings to include cognitive, depression, and falls Referrals and appointments  In addition, I have reviewed and discussed with patient certain preventive protocols, quality metrics, and best practice recommendations. A written personalized care plan for preventive services as well as general preventive health recommendations were provided to patient.     Marzella Schlein, LPN   1/61/0960   Nurse Notes: none

## 2022-10-24 ENCOUNTER — Encounter: Payer: Self-pay | Admitting: Physician Assistant

## 2022-10-24 ENCOUNTER — Ambulatory Visit (INDEPENDENT_AMBULATORY_CARE_PROVIDER_SITE_OTHER): Payer: HMO | Admitting: Physician Assistant

## 2022-10-24 VITALS — BP 122/68 | HR 76 | Temp 98.2°F | Ht 59.0 in | Wt 143.2 lb

## 2022-10-24 DIAGNOSIS — E119 Type 2 diabetes mellitus without complications: Secondary | ICD-10-CM | POA: Diagnosis not present

## 2022-10-24 DIAGNOSIS — K5903 Drug induced constipation: Secondary | ICD-10-CM | POA: Diagnosis not present

## 2022-10-24 DIAGNOSIS — Z7985 Long-term (current) use of injectable non-insulin antidiabetic drugs: Secondary | ICD-10-CM

## 2022-10-24 LAB — POCT GLYCOSYLATED HEMOGLOBIN (HGB A1C): Hemoglobin A1C: 6.8 % — AB (ref 4.0–5.6)

## 2022-10-24 NOTE — Progress Notes (Signed)
Lindsey Bray is a 72 y.o. female here for a new problem.  History of Present Illness:   Chief Complaint  Patient presents with   Constipation    Pt thinks this is due to Ozempic.     HPI   Constipation Patient is complaining about constipation and states that she thinks that it may be due to her weekly 0.25 mg ozempic. She has managed sxs with miralax for 3 days, which did not help, and drank almost a full bottle of magnesium citrate.This provided some relief. Patient has previously been on rybelsus and metformin with issues. She wishes to continue ozempic because it helps her sleep.   Diabetes 3 month follow-up. Current DM meds: Ozempic 0.25 mg weekly dosage. Blood sugars at home are: regularly checked and overall normal per patient. Patient is  compliant with medications. Denies: hypoglycemic or hyperglycemic episodes or symptoms. This patient's diabetes is complicated by HLD.  Lab Results  Component Value Date   HGBA1C 6.8 (A) 10/24/2022    Past Medical History:  Diagnosis Date   Allergy    as a child and young adult   Arthritis    Oa left shoulder   Asthma    as a child and young adult     Social History   Tobacco Use   Smoking status: Never   Smokeless tobacco: Never  Vaping Use   Vaping Use: Never used  Substance Use Topics   Alcohol use: No    Alcohol/week: 0.0 standard drinks of alcohol   Drug use: Never    Past Surgical History:  Procedure Laterality Date   BREAST SURGERY Right 14 (age 30)   benign breast tumors   CESAREAN SECTION     x1   COLONOSCOPY  2004   ~16 yrs ago- was normal    trigger thumb  05/2017   r hand    Family History  Problem Relation Age of Onset   Cancer Mother 1       colon- died age 87    Colon cancer Mother    COPD Father    Heart disease Father    Cancer Sister 8       breast   Breast cancer Sister    Cancer Sister 35       breast; had a lumpectomy and is on tamoxifen   Breast cancer Sister    Colon  polyps Sister    Esophageal cancer Neg Hx    Rectal cancer Neg Hx    Stomach cancer Neg Hx    Diabetes Neg Hx     No Known Allergies  Current Medications:   Current Outpatient Medications:    albuterol (VENTOLIN HFA) 108 (90 Base) MCG/ACT inhaler, Inhale 2 puffs into the lungs every 6 (six) hours as needed for wheezing or shortness of breath., Disp: 8 g, Rfl: 0   Continuous Blood Gluc Sensor (DEXCOM G6 SENSOR) MISC, APPLY TO SKIN AND CHANGE EVERY 10 DAYS, Disp: 3 each, Rfl: 2   ibuprofen (ADVIL) 200 MG tablet, Take 200 mg by mouth every 6 (six) hours as needed for fever, headache or mild pain., Disp: , Rfl:    polyethylene glycol (MIRALAX) 17 g packet, Take 17 g by mouth daily as needed for mild constipation., Disp: 14 each, Rfl: 0   rosuvastatin (CRESTOR) 10 MG tablet, Take 1 tablet (10 mg total) by mouth daily., Disp: 90 tablet, Rfl: 3   Semaglutide,0.25 or 0.5MG /DOS, (OZEMPIC, 0.25 OR 0.5 MG/DOSE,) 2 MG/3ML SOPN, Inject  0.25 mg into the skin once a week., Disp: 3 mL, Rfl: 2   Review of Systems:   Review of Systems  Gastrointestinal:  Positive for constipation.    Vitals:   Vitals:   10/24/22 1117  BP: 122/68  Pulse: 76  Temp: 98.2 F (36.8 C)  SpO2: 96%  Weight: 143 lb 3.2 oz (65 kg)  Height: 4\' 11"  (1.499 m)     Body mass index is 28.92 kg/m.  Physical Exam:   Physical Exam Constitutional:      General: She is not in acute distress.    Appearance: Normal appearance. She is not ill-appearing.  HENT:     Head: Normocephalic and atraumatic.     Right Ear: External ear normal.     Left Ear: External ear normal.  Eyes:     Extraocular Movements: Extraocular movements intact.     Pupils: Pupils are equal, round, and reactive to light.  Cardiovascular:     Rate and Rhythm: Normal rate and regular rhythm.     Heart sounds: Normal heart sounds. No murmur heard.    No gallop.  Pulmonary:     Effort: Pulmonary effort is normal. No respiratory distress.     Breath  sounds: Normal breath sounds. No wheezing or rales.  Skin:    General: Skin is warm and dry.  Neurological:     Mental Status: She is alert and oriented to person, place, and time.  Psychiatric:        Judgment: Judgment normal.    Results for orders placed or performed in visit on 10/24/22  POCT HgB A1C  Result Value Ref Range   Hemoglobin A1C 6.8 (A) 4.0 - 5.6 %    Assessment and Plan:   Type 2 diabetes mellitus without complication, without long-term current use of insulin (HCC) Improved Despite constipation, she would like to continue Ozempic 0.25 mg weekly Follow-up in 3-6 month(s), sooner if concerns  Drug-induced constipation No red flags Discussed recommendation of daily miralax and colace for bowel regularity Follow up in any concerns  I,Verona Buck,acting as a scribe for [employer], PA.,have documented all relevant documentation on the behalf of Jarold Motto, PA,as directed by  Jarold Motto, PA while in the presence of Jarold Motto, [place].  I, Jarold Motto, [place], have reviewed all documentation for this visit. The documentation on 10/24/22 for the exam, diagnosis, procedures, and orders are all accurate and complete.  Jarold Motto, PA-C

## 2022-10-24 NOTE — Patient Instructions (Signed)
It was great to see you!  Continue Ozempic 0.25 mg weekly  To treat your constipation today: -Take 100 mg of docusate sodium (also known as Colace, however generic is fine!) by mouth daily to soften stools -Drink at least 64 oz of water daily -Add in 1 capful of polyethylene glycol (also known as Miralax, however generic is fine!) to beverage of choice daily  Goal is to have a formed, soft bowel movement regularly    Take care,  Jarold Motto PA-C

## 2022-12-08 ENCOUNTER — Encounter: Payer: Self-pay | Admitting: Physician Assistant

## 2022-12-08 ENCOUNTER — Ambulatory Visit (INDEPENDENT_AMBULATORY_CARE_PROVIDER_SITE_OTHER): Payer: HMO | Admitting: Physician Assistant

## 2022-12-08 VITALS — BP 110/70 | HR 68 | Temp 97.5°F | Ht 59.0 in | Wt 142.5 lb

## 2022-12-08 DIAGNOSIS — E1169 Type 2 diabetes mellitus with other specified complication: Secondary | ICD-10-CM | POA: Diagnosis not present

## 2022-12-08 DIAGNOSIS — Z7985 Long-term (current) use of injectable non-insulin antidiabetic drugs: Secondary | ICD-10-CM

## 2022-12-08 DIAGNOSIS — Z0000 Encounter for general adult medical examination without abnormal findings: Secondary | ICD-10-CM

## 2022-12-08 DIAGNOSIS — L659 Nonscarring hair loss, unspecified: Secondary | ICD-10-CM

## 2022-12-08 DIAGNOSIS — E119 Type 2 diabetes mellitus without complications: Secondary | ICD-10-CM | POA: Diagnosis not present

## 2022-12-08 DIAGNOSIS — E785 Hyperlipidemia, unspecified: Secondary | ICD-10-CM | POA: Diagnosis not present

## 2022-12-08 DIAGNOSIS — K5903 Drug induced constipation: Secondary | ICD-10-CM

## 2022-12-08 LAB — COMPREHENSIVE METABOLIC PANEL
ALT: 18 U/L (ref 0–35)
AST: 21 U/L (ref 0–37)
Albumin: 4.4 g/dL (ref 3.5–5.2)
Alkaline Phosphatase: 75 U/L (ref 39–117)
BUN: 14 mg/dL (ref 6–23)
CO2: 27 mEq/L (ref 19–32)
Calcium: 9.8 mg/dL (ref 8.4–10.5)
Chloride: 105 mEq/L (ref 96–112)
Creatinine, Ser: 0.76 mg/dL (ref 0.40–1.20)
GFR: 78.94 mL/min (ref >60.00)
Glucose, Bld: 139 mg/dL — ABNORMAL HIGH (ref 70–99)
Potassium: 4.7 mEq/L (ref 3.5–5.1)
Sodium: 140 mEq/L (ref 135–145)
Total Bilirubin: 0.5 mg/dL (ref 0.2–1.2)
Total Protein: 7.7 g/dL (ref 6.0–8.3)

## 2022-12-08 LAB — CBC WITH DIFFERENTIAL/PLATELET
Basophils Absolute: 0 10*3/uL (ref 0.0–0.1)
Basophils Relative: 0.7 % (ref 0.0–3.0)
Eosinophils Absolute: 0.2 10*3/uL (ref 0.0–0.7)
Eosinophils Relative: 5.9 % — ABNORMAL HIGH (ref 0.0–5.0)
HCT: 42.5 % (ref 36.0–46.0)
Hemoglobin: 13.5 g/dL (ref 12.0–15.0)
Lymphocytes Relative: 31.8 % (ref 12.0–46.0)
Lymphs Abs: 1.1 10*3/uL (ref 0.7–4.0)
MCHC: 31.6 g/dL (ref 30.0–36.0)
MCV: 83.8 fl (ref 78.0–100.0)
Monocytes Absolute: 0.5 10*3/uL (ref 0.1–1.0)
Monocytes Relative: 14.1 % — ABNORMAL HIGH (ref 3.0–12.0)
Neutro Abs: 1.6 10*3/uL (ref 1.4–7.7)
Neutrophils Relative %: 47.5 % (ref 43.0–77.0)
Platelets: 178 10*3/uL (ref 150.0–400.0)
RBC: 5.07 Mil/uL (ref 3.87–5.11)
RDW: 14.5 % (ref 11.5–15.5)
WBC: 3.3 10*3/uL — ABNORMAL LOW (ref 4.0–10.5)

## 2022-12-08 LAB — MICROALBUMIN / CREATININE URINE RATIO
Creatinine,U: 234.8 mg/dL
Microalb Creat Ratio: 0.5 mg/g (ref 0.0–30.0)
Microalb, Ur: 1.3 mg/dL (ref 0.0–1.9)

## 2022-12-08 LAB — LIPID PANEL
Cholesterol: 126 mg/dL (ref 0–200)
HDL: 53 mg/dL (ref >39.00)
LDL Cholesterol: 61 mg/dL (ref 0–99)
NonHDL: 72.76
Total CHOL/HDL Ratio: 2
Triglycerides: 58 mg/dL (ref 0.0–149.0)
VLDL: 11.6 mg/dL (ref 0.0–40.0)

## 2022-12-08 MED ORDER — LUBIPROSTONE 8 MCG PO CAPS: 8.0000 ug | ORAL_CAPSULE | Freq: Two times a day (BID) | ORAL | 1 refills | Status: DC | PRN

## 2022-12-08 MED ORDER — OZEMPIC (0.25 OR 0.5 MG/DOSE) 2 MG/3ML ~~LOC~~ SOPN: 0.2500 mg | PEN_INJECTOR | SUBCUTANEOUS | 2 refills | Status: DC

## 2022-12-08 NOTE — Patient Instructions (Addendum)
It was great to see you!  Follow-up in 3 months for diabetes follow-up  Please go to the lab for blood work.   Our office will call you with your results unless you have chosen to receive results via MyChart.  If your blood work is normal we will follow-up each year for physicals and as scheduled for chronic medical problems.  If anything is abnormal we will treat accordingly and get you in for a follow-up.  Take care,  Lelon Mast

## 2022-12-08 NOTE — Progress Notes (Signed)
Subjective:    Lindsey Bray is a 72 y.o. female and is here for a comprehensive physical exam.  HPI  Health Maintenance Due  Topic Date Due   Zoster Vaccines- Shingrix (1 of 2) Never done   Diabetic kidney evaluation - Urine ACR  12/06/2022    Acute Concerns: None.   Chronic Issues: Diabetes: Compliant with 0.25 mg Ozempic weekly. Monitors blood sugar daily in the morning.  Reports an average blood glucose of 140-150. This morning blood glucose was 190, notes that she ate a lot of sugar the night before.  States she is still experiencing constipation. Uses 17 g Miralax and Colace everyday, still not experiencing regular bowel movements she is satisfied with. Friday she used Magnesium Citrate with positive results. She would rather not have to use a daily medication to manage her constipation.   Denies rectal bleeding or excessive straining. Lab Results  Component Value Date   HGBA1C 6.8 (A) 10/24/2022   Alopecia She is experiencing some issues with scalp irritation and hair loss  She would like to see dermatology  Health Maintenance: Immunizations -- UTD Colonoscopy -- Endoscopy performed 12/22/18. Polyp removed.  Mammogram -- Last done 01/03/2022. Results were normal.  PAP -- Last done 12/31/2020. Results were normal. Bone Density -- Last done 11/19/18. Results were normal. Diet -- Healthy overall.  Exercise -- 2-3 times a week she does line dance, weight training, walking 2-3 miles, and boot camp   Sleep habits -- Taking extra naps. Otherwise normal.  Mood -- Stable  UTD with dentist? - No. UTD with eye doctor? - Next appointment in Sept.   Weight history: Wt Readings from Last 10 Encounters:  10/24/22 143 lb 3.2 oz (65 kg)  08/19/22 144 lb 6.4 oz (65.5 kg)  06/27/22 145 lb 12.8 oz (66.1 kg)  06/12/22 144 lb (65.3 kg)  03/12/22 144 lb (65.3 kg)  01/03/22 143 lb 8 oz (65.1 kg)  12/05/21 144 lb 6.4 oz (65.5 kg)  07/25/21 138 lb 3.2 oz (62.7 kg)  05/22/21 136  lb 12.8 oz (62.1 kg)  04/22/21 132 lb 6.4 oz (60.1 kg)   There is no height or weight on file to calculate BMI. No LMP recorded. Patient is postmenopausal.  Alcohol use:  reports no history of alcohol use.  Tobacco use:  Tobacco Use: Low Risk  (10/24/2022)   Patient History    Smoking Tobacco Use: Never    Smokeless Tobacco Use: Never    Passive Exposure: Not on file   Eligible for lung cancer screening? no     10/24/2022   11:16 AM  Depression screen PHQ 2/9  Decreased Interest 0  Down, Depressed, Hopeless 0  PHQ - 2 Score 0     Other providers/specialists: Patient Care Team: Jarold Motto, [place] as PCP - General (Physician Assistant) Yolonda Kida, MD as Consulting Physician (Orthopedic Surgery) Annamaria Helling, MD as Consulting Physician (Obstetrics and Gynecology) [facility], [place] as Consulting Physician (Optometry)    PMHx, SurgHx, SocialHx, Medications, and Allergies were reviewed in the Visit Navigator and updated as appropriate.   Past Medical History:  Diagnosis Date   Allergy    as a child and young adult   Arthritis    Oa left shoulder   Asthma    as a child and young adult     Past Surgical History:  Procedure Laterality Date   BREAST SURGERY Right 8 (age 9)   benign breast tumors   CESAREAN  SECTION     x1   COLONOSCOPY  2004   ~16 yrs ago- was normal    trigger thumb  05/2017   r hand     Family History  Problem Relation Age of Onset   Cancer Mother 62       colon- died age 36    Colon cancer Mother    COPD Father    Heart disease Father    Cancer Sister 67       breast   Breast cancer Sister    Cancer Sister 39       breast; had a lumpectomy and is on tamoxifen   Breast cancer Sister    Colon polyps Sister    Esophageal cancer Neg Hx    Rectal cancer Neg Hx    Stomach cancer Neg Hx    Diabetes Neg Hx     Social History   Tobacco Use   Smoking status: Never   Smokeless tobacco: Never  Vaping Use    Vaping status: Never Used  Substance Use Topics   Alcohol use: No    Alcohol/week: 0.0 standard drinks of alcohol   Drug use: Never    Review of Systems:   Review of Systems  Gastrointestinal:  Positive for constipation (side effect of Ozempic).    Objective:   There were no vitals taken for this visit. There is no height or weight on file to calculate BMI.   General Appearance:    Alert, cooperative, no distress, appears stated age  Head:    Normocephalic, without obvious abnormality, atraumatic  Eyes:    PERRL, conjunctiva/corneas clear, EOM's intact, fundi    benign, both eyes  Ears:    Normal TM's and external ear canals, both ears  Nose:   Nares normal, septum midline, mucosa normal, no drainage    or sinus tenderness  Throat:   Lips, mucosa, and tongue normal; teeth and gums normal  Neck:   Supple, symmetrical, trachea midline, no adenopathy;    thyroid:  no enlargement/tenderness/nodules; no carotid   bruit or JVD  Back:     Symmetric, no curvature, ROM normal, no CVA tenderness  Lungs:     Clear to auscultation bilaterally, respirations unlabored  Chest Wall:    No tenderness or deformity   Heart:    Regular rate and rhythm, S1 and S2 normal, no murmur, rub or gallop  Breast Exam:    Deferred  Abdomen:     Soft, non-tender, bowel sounds active all four quadrants,    no masses, no organomegaly  Genitalia:    Deferred  Extremities:   Extremities normal, atraumatic, no cyanosis or edema  Pulses:   2+ and symmetric all extremities  Skin:   Skin color, texture, turgor normal, no rashes or lesions  Lymph nodes:   Cervical, supraclavicular, and axillary nodes normal  Neurologic:   CNII-XII intact, normal strength, sensation and reflexes    throughout    Assessment/Plan:   Routine physical examination Today patient counseled on age appropriate routine health concerns for screening and prevention, each reviewed and up to date or declined. Immunizations reviewed and up  to date or declined. Labs ordered and reviewed. Risk factors for depression reviewed and negative. Hearing function and visual acuity are intact. ADLs screened and addressed as needed. Functional ability and level of safety reviewed and appropriate. Education, counseling and referrals performed based on assessed risks today. Patient provided with a copy of personalized plan for preventive services.  Long-term  current use of injectable noninsulin antidiabetic medication; Type 2 diabetes mellitus without complication, without long-term current use of insulin (HCC) Too soon to update A1c Will update in 3 months Continue Ozempic 0.25 mg weekly  Hyperlipidemia associated with type 2 diabetes mellitus (HCC) Update lipid panel and adjust crestor 10 mg daily as indicated  Drug-induced constipation Reviewed She would like to trial as needed amitiza for her symptoms Follow-up in 3 months, sooner if concerns  Scalp alopecia Referral to dermatology   I,Emily Lagle,acting as a scribe for Jarold Motto, PA.,have documented all relevant documentation on the behalf of Jarold Motto, PA,as directed by  Jarold Motto, PA while in the presence of Jarold Motto, [place].  I, Jarold Motto, [place], have reviewed all documentation for this visit. The documentation on 12/08/22 for the exam, diagnosis, procedures, and orders are all accurate and complete.  Jarold Motto, PA-C [facility]

## 2022-12-23 ENCOUNTER — Other Ambulatory Visit: Payer: Self-pay | Admitting: *Deleted

## 2022-12-23 DIAGNOSIS — E1169 Type 2 diabetes mellitus with other specified complication: Secondary | ICD-10-CM

## 2022-12-23 MED ORDER — ROSUVASTATIN CALCIUM 10 MG PO TABS
10.0000 mg | ORAL_TABLET | Freq: Every day | ORAL | 3 refills | Status: DC
Start: 2022-12-23 — End: 2024-01-06

## 2023-01-08 LAB — HM MAMMOGRAPHY

## 2023-01-19 LAB — HM DIABETES EYE EXAM

## 2023-02-03 ENCOUNTER — Encounter (HOSPITAL_COMMUNITY): Payer: Self-pay | Admitting: Emergency Medicine

## 2023-02-03 ENCOUNTER — Emergency Department (HOSPITAL_COMMUNITY): Payer: PPO

## 2023-02-03 ENCOUNTER — Ambulatory Visit (HOSPITAL_COMMUNITY)
Admission: EM | Admit: 2023-02-03 | Discharge: 2023-02-03 | Disposition: A | Discharge status: Home / Self Care | Payer: PPO | Attending: Emergency Medicine | Admitting: Emergency Medicine

## 2023-02-03 ENCOUNTER — Emergency Department (HOSPITAL_COMMUNITY)
Admission: EM | Admit: 2023-02-03 | Discharge: 2023-02-03 | Disposition: A | Discharge status: Home / Self Care | Payer: PPO | Attending: Emergency Medicine | Admitting: Emergency Medicine

## 2023-02-03 ENCOUNTER — Encounter (HOSPITAL_COMMUNITY): Payer: Self-pay

## 2023-02-03 DIAGNOSIS — R519 Headache, unspecified: Secondary | ICD-10-CM

## 2023-02-03 DIAGNOSIS — R202 Paresthesia of skin: Secondary | ICD-10-CM | POA: Insufficient documentation

## 2023-02-03 DIAGNOSIS — R200 Anesthesia of skin: Secondary | ICD-10-CM | POA: Diagnosis not present

## 2023-02-03 LAB — CBC
HCT: 41.7 % (ref 36.0–46.0)
Hemoglobin: 13.2 g/dL (ref 12.0–15.0)
MCH: 26.5 pg (ref 26.0–34.0)
MCHC: 31.7 g/dL (ref 30.0–36.0)
MCV: 83.6 fL (ref 80.0–100.0)
Platelets: 162 10*3/uL (ref 150–400)
RBC: 4.99 MIL/uL (ref 3.87–5.11)
RDW: 14 % (ref 11.5–15.5)
WBC: 3.2 10*3/uL — ABNORMAL LOW (ref 4.0–10.5)
nRBC: 0 % (ref 0.0–0.2)

## 2023-02-03 LAB — I-STAT CHEM 8, ED
BUN: 13 mg/dL (ref 8–23)
Calcium, Ion: 1.15 mmol/L (ref 1.15–1.40)
Chloride: 108 mmol/L (ref 98–111)
Creatinine, Ser: 0.7 mg/dL (ref 0.44–1.00)
Glucose, Bld: 159 mg/dL — ABNORMAL HIGH (ref 70–99)
HCT: 42 % (ref 36.0–46.0)
Hemoglobin: 14.3 g/dL (ref 12.0–15.0)
Potassium: 4.3 mmol/L (ref 3.5–5.1)
Sodium: 139 mmol/L (ref 135–145)
TCO2: 22 mmol/L (ref 22–32)

## 2023-02-03 LAB — CBG MONITORING, ED: Glucose-Capillary: 156 mg/dL — ABNORMAL HIGH (ref 70–99)

## 2023-02-03 LAB — POCT FASTING CBG KUC MANUAL ENTRY: POCT Glucose (KUC): 160 mg/dL — AB (ref 70–99)

## 2023-02-03 MED ORDER — IOHEXOL 350 MG/ML SOLN
75.0000 mL | Freq: Once | INTRAVENOUS | Status: AC | PRN
  Administered 2023-02-03: 75 mL via INTRAVENOUS

## 2023-02-03 MED ORDER — GADOBUTROL 1 MMOL/ML IV SOLN
7.0000 mL | Freq: Once | INTRAVENOUS | Status: AC | PRN
  Administered 2023-02-03: 7 mL via INTRAVENOUS

## 2023-02-03 NOTE — ED Notes (Signed)
[facility] at bedside with exam

## 2023-02-03 NOTE — ED Notes (Signed)
Dr Alvino Chapel at bedside

## 2023-02-03 NOTE — ED Notes (Signed)
Reported to Aundra Millet, Consulting civil engineer in ED

## 2023-02-03 NOTE — ED Provider Notes (Signed)
[place] EMERGENCY DEPARTMENT AT [facility] Provider Note   CSN: [id number] Arrival date & time: 02/03/23  4166     History  Chief Complaint  Patient presents with   Headache    Lindsey Bray is a 72 y.o. female.   Headache Patient woke up with a severe headache at 5 this morning.  States mostly right-sided.  Also states since then she is "numb" on her right side.  Involves face upper and lower extremities and torso.  States just feels little different.  Still has sensation.  Took a Tylenol at about 630 and headache is doing much better.  No vision changes.  Is diabetic.  Has not had history of migraines.     Home Medications Prior to Admission medications   Medication Sig Start Date End Date Taking? Authorizing Provider  albuterol (VENTOLIN HFA) 108 (90 Base) MCG/ACT inhaler Inhale 2 puffs into the lungs every 6 (six) hours as needed for wheezing or shortness of breath. 03/12/22  Yes Jarold Motto, PA  Docusate Sodium (COLACE PO) Take 1 tablet by mouth daily.   Yes [provider]  polyethylene glycol (MIRALAX / GLYCOLAX) 17 g packet Take 17 g by mouth daily as needed for mild constipation.   Yes [provider]  rosuvastatin (CRESTOR) 10 MG tablet Take 1 tablet (10 mg total) by mouth daily. 12/23/22  Yes Worley, Lelon Mast, PA  Semaglutide,0.25 or 0.5MG /DOS, (OZEMPIC, 0.25 OR 0.5 MG/DOSE,) 2 MG/3ML SOPN Inject 0.25 mg into the skin once a week. 12/08/22  Yes Jarold Motto, PA      Allergies    Patient has no known allergies.    Review of Systems   Review of Systems  Neurological:  Positive for headaches.    Physical Exam Updated Vital Signs BP 134/69 (BP Location: Left Arm)   Pulse (!) 55   Temp 97.8 F (36.6 C) (Oral)   Resp 17   Ht 4\' 11"  (1.499 m)   Wt 63.5 kg   SpO2 100%   BMI 28.28 kg/m  Physical Exam Vitals and nursing note reviewed.  Eyes:     Extraocular Movements: Extraocular movements intact.     Pupils: Pupils are  equal, round, and reactive to light.  Neurological:     Mental Status: She is alert.     Comments: Awake and appropriate.  Face symmetric.  Finger-nose intact bilaterally.  Sensation grossly intact but states it feels different on the right side compared to the left.  Does involve the face upper and lower extremities and the torso.  Good strength in bilateral extremities.     ED Results / Procedures / Treatments   Labs (all labs ordered are listed, but only abnormal results are displayed) Labs Reviewed  CBC - Abnormal; Notable for the following components:      Result Value   WBC 3.2 (*)    All other components within normal limits  CBG MONITORING, ED - Abnormal; Notable for the following components:   Glucose-Capillary 156 (*)    All other components within normal limits  I-STAT CHEM 8, ED - Abnormal; Notable for the following components:   Glucose, Bld 159 (*)    All other components within normal limits    EKG EKG Interpretation Date/Time:  Tuesday February 03 2023 09:17:12 EDT Ventricular Rate:  60 PR Interval:  141 QRS Duration:  89 QT Interval:  427 QTC Calculation: 427 R Axis:   61  Text Interpretation: Sinus rhythm Confirmed by Benjiman Core (  [id number]) on 02/03/2023 10:23:41 AM  Radiology CT ANGIO HEAD NECK W WO CM  Result Date: 02/03/2023 CLINICAL DATA:  Neuro deficit with acute stroke suspected. EXAM: CT ANGIOGRAPHY HEAD AND NECK WITH AND WITHOUT CONTRAST TECHNIQUE: Multidetector CT imaging of the head and neck was performed using the standard protocol during bolus administration of intravenous contrast. Multiplanar CT image reconstructions and MIPs were obtained to evaluate the vascular anatomy. Carotid stenosis measurements (when applicable) are obtained utilizing NASCET criteria, using the distal internal carotid diameter as the denominator. RADIATION DOSE REDUCTION: This exam was performed according to the departmental dose-optimization program which includes  automated exposure control, adjustment of the mA and/or kV according to patient size and/or use of iterative reconstruction technique. CONTRAST:  75mL OMNIPAQUE IOHEXOL 350 MG/ML SOLN COMPARISON:  Head CT 01/23/2007 FINDINGS: CT HEAD FINDINGS Brain: No evidence of acute infarction, hemorrhage, hydrocephalus, extra-axial collection or mass lesion/mass effect. Vascular: No hyperdense vessel or unexpected calcification. Skull: Normal. Negative for fracture or focal lesion. Sinuses/Orbits: No acute finding. Review of the MIP images confirms the above findings CTA NECK FINDINGS Aortic arch: Normal with 3 vessel branching Right carotid system: Mild atheromatous plaque at the bifurcation. No stenosis, ulceration, or beading Left carotid system: Atheromatous plaque at the bifurcation which is mild for age. No stenosis or ulceration Vertebral arteries: Partially obscured proximal right vertebral artery due to streak artifact from intravenous contrast. The codominant vertebral arteries are smoothly contoured and widely patent to the dura. Skeleton: Negative Other neck: No significant finding Upper chest: Clear apical lungs Review of the MIP images confirms the above findings CTA HEAD FINDINGS Anterior circulation: Vessels are smoothly contoured and widely patent. Atheromatous calcification of the cavernous carotids. No aneurysm or vascular malformation Posterior circulation: Small vertebral and basilar arteries in the setting of fetal type bilateral PCA flow. There is further suboptimal visualization of the distal basilar due to mild motion artifact. No branch occlusion, beading, or aneurysm Venous sinuses: Unremarkable for arterial timing. Anatomic variants: As above Review of the MIP images confirms the above findings IMPRESSION: No emergent finding.  Mild atherosclerosis in the head and neck. Electronically Signed   By: Tiburcio Pea M.D.   On: 02/03/2023 12:07    Procedures Procedures    Medications Ordered in  ED Medications  iohexol (OMNIPAQUE) 350 MG/ML injection 75 mL (75 mLs Intravenous Contrast Given 02/03/23 0943)  gadobutrol (GADAVIST) 1 MMOL/ML injection 7 mL (7 mLs Intravenous Contrast Given 02/03/23 1454)    ED Course/ Medical Decision Making/ A&P                                 Medical Decision Making Amount and/or Complexity of Data Reviewed Labs: ordered. Radiology: ordered.  Risk Prescription drug management.   Patient acute headache with potential neurodeficits and that she has right-sided numbness.  However besides the change in sensation there is not necessarily deficit.  We are already almost 4-1/2 hours in.  No LVO signs.  Do not think the need to activate a code stroke at this time but will activate code medical to get CT angiography done urgently.  Differential diagnosis does include stroke although not a TNK candidate due to time of onset.  Also intracranial hemorrhage considered.  CT and CTA reassuring.  No clear cause found.  Will get MRI.  If MRI negative should be able to discharge home.  Care turned over to Dr. Jeraldine Loots.  Final Clinical Impression(s) / ED Diagnoses Final diagnoses:  Paresthesia  Acute nonintractable headache, unspecified headache type    Rx / DC Orders ED Discharge Orders     None         Benjiman Core, MD 02/03/23 1555

## 2023-02-03 NOTE — ED Notes (Signed)
Patient transported to MRI 

## 2023-02-03 NOTE — ED Triage Notes (Addendum)
0530AM: Woke up with "worst headache that she's had". Pt reports numbness to her right side from right arm to right leg. No weakness noted. Alert and oriented x 4. 180s systolic initial bp in UC. Only medical hx is DM 2 and takes Ozempic. Took 1 tylenol (500mg ) at home prior to going to UC.  EMS VS: CBG 160 66 HR 98% on RA

## 2023-02-03 NOTE — ED Notes (Addendum)
Called [facility] they did not have a truck advised to call EMS  EMS called, truck on the way

## 2023-02-03 NOTE — ED Notes (Signed)
Vital signs stable. 

## 2023-02-03 NOTE — ED Provider Notes (Addendum)
[facility]    CSN: [id number] Arrival date & time: 02/03/23  0803      History   Chief Complaint Chief Complaint  Patient presents with   Headache   Numbness    HPI Lindsey Bray is a 72 y.o. female.   Patient presents with persistent "worst headache of her life" that woke her out of her sleep at 5 AM this morning.  Patient states since then she has had numbness and heaviness sensation to right upper and lower extremities.  Patient reports taking Tylenol around 6:30 AM with minimal relief of headache.  Last known normal was around 1030 last night before she went to bed. History of diabetes and currently taking only Ozempic for diabetes.   Headache Associated symptoms: numbness   Associated symptoms: no dizziness, no fatigue, no fever, no photophobia and no weakness     Past Medical History:  Diagnosis Date   Allergy    as a child and young adult   Arthritis    Oa left shoulder   Asthma    as a child and young adult   Diabetes mellitus without complication (HCC) O5-2022    Patient Active Problem List   Diagnosis Date Noted   Hyperlipidemia associated with type 2 diabetes mellitus (HCC) 03/12/2022   Other neutropenia (HCC) 01/03/2022   SBO (small bowel obstruction) (HCC) 04/10/2021   Type 2 diabetes mellitus without complication, without long-term current use of insulin (HCC) 10/17/2019   Mild intermittent asthma without complication 09/13/2018   Family history of colon cancer in mother 09/13/2018   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      Home Medications    Prior to Admission medications   Medication Sig  Start Date End Date Taking? Authorizing Provider  albuterol (VENTOLIN HFA) 108 (90 Base) MCG/ACT inhaler Inhale 2 puffs into the lungs every 6 (six) hours as needed for wheezing or shortness of breath. 03/12/22   Jarold Motto, PA  Continuous Blood Gluc Sensor (DEXCOM G6 SENSOR) MISC APPLY TO SKIN AND CHANGE EVERY 10 DAYS 06/12/22   Jarold Motto, PA  ibuprofen (ADVIL) 200 MG tablet Take 200 mg by mouth every 6 (six) hours as needed for fever, headache or mild pain.    [provider]  lubiprostone (AMITIZA) 8 MCG capsule Take 1 capsule (8 mcg total) by mouth 2 (two) times daily as needed for constipation. 12/08/22   Jarold Motto, PA  polyethylene glycol (MIRALAX) 17 g packet Take 17 g by mouth daily as needed for mild constipation. 04/15/21   Maczis, Elmer Sow, PA-C  rosuvastatin (CRESTOR) 10 MG tablet Take 1 tablet (10 mg total) by mouth daily. 12/23/22   Jarold Motto, PA  Semaglutide,0.25 or 0.5MG /DOS, (OZEMPIC, 0.25 OR 0.5 MG/DOSE,) 2 MG/3ML SOPN Inject 0.25 mg into the skin once a week. 12/08/22   Jarold Motto, PA    Family History Family History  Problem Relation Age of Onset   Cancer Mother 54       colon- died age 39    Colon cancer Mother  COPD Father    Heart disease Father    Cancer Sister 27       breast   Breast cancer Sister    Cancer Sister 79       breast; had a lumpectomy and is on tamoxifen   Breast cancer Sister    Colon polyps Sister    Rheum arthritis Sister    Esophageal cancer Neg Hx    Rectal cancer Neg Hx    Stomach cancer Neg Hx    Diabetes Neg Hx     Social History Social History   Tobacco Use   Smoking status: Never   Smokeless tobacco: Never  Vaping Use   Vaping status: Never Used  Substance Use Topics   Alcohol use: No   Drug use: Never     Allergies   Patient has no known allergies.   Review of Systems Review of Systems  Constitutional:  Negative for fatigue and fever.  Eyes:  Negative for photophobia and visual  disturbance.  Cardiovascular:  Negative for chest pain.  Musculoskeletal:  Negative for gait problem.  Neurological:  Positive for numbness and headaches. Negative for dizziness, tremors, syncope, facial asymmetry, speech difficulty, weakness and light-headedness.  Psychiatric/Behavioral:  Negative for confusion.      Physical Exam Triage Vital Signs ED Triage Vitals  Encounter Vitals Group     BP 02/03/23 0821 (!) 141/78     Systolic BP Percentile --      Diastolic BP Percentile --      Pulse Rate 02/03/23 0821 72     Resp 02/03/23 0821 18     Temp 02/03/23 0821 98.1 F (36.7 C)     Temp Source 02/03/23 0821 Oral     SpO2 02/03/23 0821 100 %     Weight --      Height --      Head Circumference --      Peak Flow --      Pain Score 02/03/23 0820 8     Pain Loc --      Pain Education --      Exclude from Growth Chart --    No data found.  Updated Vital Signs BP (!) 182/79 (BP Location: Right Arm)   Pulse 67   Temp 98.1 F (36.7 C) (Oral)   Resp 18   SpO2 100%   Visual Acuity Right Eye Distance:   Left Eye Distance:   Bilateral Distance:    Right Eye Near:   Left Eye Near:    Bilateral Near:     Physical Exam Vitals and nursing note reviewed.  Constitutional:      General: She is awake. She is not in acute distress.    Appearance: Normal appearance. She is well-developed and well-groomed. She is not ill-appearing, toxic-appearing or diaphoretic.  HENT:     Head: Normocephalic.  Eyes:     Extraocular Movements: Extraocular movements intact.     Pupils: Pupils are equal, round, and reactive to light.  Cardiovascular:     Rate and Rhythm: Normal rate.     Heart sounds: Normal heart sounds.  Pulmonary:     Effort: Pulmonary effort is normal.     Breath sounds: Normal breath sounds.  Musculoskeletal:        General: Normal range of motion.     Cervical back: Normal range of motion and neck supple.  Skin:    General: Skin is warm and dry.  Neurological:      Mental  Status: She is alert and oriented to person, place, and time.     GCS: GCS eye subscore is 4. GCS verbal subscore is 5. GCS motor subscore is 6.     Cranial Nerves: Cranial nerves 2-12 are intact.     Sensory: Sensory deficit present.     Motor: Motor function is intact.     Coordination: Coordination is intact.     Gait: Gait is intact.  Psychiatric:        Attention and Perception: Attention and perception normal.        Mood and Affect: Mood and affect normal.        Speech: Speech normal.        Behavior: Behavior normal. Behavior is cooperative.        Thought Content: Thought content normal.        Cognition and Memory: Cognition and memory normal.        Judgment: Judgment normal.      UC Treatments / Results  Labs (all labs ordered are listed, but only abnormal results are displayed) Labs Reviewed  POCT FASTING CBG [facility] MANUAL ENTRY - Abnormal; Notable for the following components:      Result Value   POCT Glucose ([facility]) 160 (*)    All other components within normal limits    EKG   Radiology No results found.  Procedures Procedures (including critical care time)  Medications Ordered in UC Medications - No data to display  Initial Impression / Assessment and Plan / UC Course  I have reviewed the triage vital signs and the nursing notes.  Pertinent labs & imaging results that were available during my care of the patient were reviewed by me and considered in my medical decision making (see chart for details).     Patient presented with "worst headache of her life" that woke her out of her sleep at 5 AM this morning.  Since then patient reports numbness and heaviness sensation to right upper and lower extremities.  Last known normal was around 1030 last night prior to going to bed.  History of diabetes and currently taking Ozempic.  Upon assessment patient has decreased sensation to right side of face, and right upper and lower extremities.  No other  neurodeficits of note. GCS 15. A&O x 3. Blood pressure is 141/78 in clinic. CBG 160.  Advised clinical staff to call EMS for transport to ER for imaging and further workup to rule out stroke. Final Clinical Impressions(s) / UC Diagnoses   Final diagnoses:  Numbness  Bad headache   Discharge Instructions   None    ED Prescriptions   None    PDMP not reviewed this encounter.   Letta Kocher, NP 02/03/23 0846    Letta Kocher, NP 02/03/23 [phone]

## 2023-02-03 NOTE — ED Triage Notes (Signed)
Provider with patient currently

## 2023-02-03 NOTE — ED Provider Notes (Signed)
5:01 PM Care of the patient assumed at signout.  On my evaluation patient awake, alert, in no distress.  I reviewed the MRI, unremarkable.   Gerhard Munch, MD 02/03/23 [phone]

## 2023-02-03 NOTE — ED Triage Notes (Signed)
Patient is alert and oriented x 3 .  Patient moves all extremities.  Patient reports waking at 5 am and had a headache. Rated headache 8/10 at that time.   Took tylenol around 6:30 am.  Patient continues to complain of headache over right eye.  Rates pain 3/10.  Patient reports right side of body is "not normal".  Patient also states "numbness"  Currently provider at bedside assessing patient

## 2023-03-10 ENCOUNTER — Encounter: Payer: Self-pay | Admitting: Physician Assistant

## 2023-03-10 ENCOUNTER — Ambulatory Visit (INDEPENDENT_AMBULATORY_CARE_PROVIDER_SITE_OTHER): Payer: PPO | Admitting: Physician Assistant

## 2023-03-10 VITALS — BP 130/76 | HR 73 | Temp 97.7°F | Ht 59.0 in | Wt 145.0 lb

## 2023-03-10 DIAGNOSIS — E119 Type 2 diabetes mellitus without complications: Secondary | ICD-10-CM | POA: Diagnosis not present

## 2023-03-10 DIAGNOSIS — Z7985 Long-term (current) use of injectable non-insulin antidiabetic drugs: Secondary | ICD-10-CM

## 2023-03-10 DIAGNOSIS — L659 Nonscarring hair loss, unspecified: Secondary | ICD-10-CM | POA: Diagnosis not present

## 2023-03-10 DIAGNOSIS — G629 Polyneuropathy, unspecified: Secondary | ICD-10-CM | POA: Diagnosis not present

## 2023-03-10 LAB — POCT GLYCOSYLATED HEMOGLOBIN (HGB A1C): Hemoglobin A1C: 7.6 % — AB (ref 4.0–5.6)

## 2023-03-10 MED ORDER — FREESTYLE LIBRE 3 SENSOR MISC
3 refills | Status: DC
Start: 2023-03-10 — End: 2023-07-29

## 2023-03-10 MED ORDER — OZEMPIC (0.25 OR 0.5 MG/DOSE) 2 MG/3ML ~~LOC~~ SOPN
0.5000 mg | PEN_INJECTOR | SUBCUTANEOUS | 1 refills | Status: DC
Start: 2023-03-10 — End: 2023-06-10

## 2023-03-10 NOTE — Progress Notes (Signed)
Lindsey Bray is a 72 y.o. female here for a follow-up of a pre-existing problem.  History of Present Illness:   Chief Complaint  Patient presents with   Diabetes   HPI  Diabetes: Managed/Compliant with 0.25 mg Ozempic weekly. Tolerating well, but notes that when she was on Farxiga (discontinued b/c of frequent urination) her A1c seemed more controlled.   Discussed increasing Ozempic to 0.5 mg - she will try this on next refill to see if her A1c improves.  Endorses constipation  - managed with Colace, prune juice, and Smoothe Move tea.  Also possible neuropathy as she notices burning and stinging in her feet at night. She states sx have been occurring for a while but she didn't bring it up. Discussed FreeStyle Libre. She notes that she was contacted by  and was told the Lindsey Bray would be covered.  States doesn't eat many fruits or vegetables and states hasn't been instructed on meal balancing. Instructed during this visit and given informatory handout. She does walk regularly.    Denies hypoglycemic or hyperglycemic episodes or symptoms.  A1c increased from 6.8 on 6/21. Lab Results  Component Value Date   HGBA1C 7.6 (A) 03/10/2023   Alopecia: Reports that she has not managed with get in contact with a dermatologist.   Paresthesia: On 10/01 presented to [facility] ED for "acute headache with potential neurodeficits and that she has right-sided numbness" beginning at 5 AM that improved slightly after taking a Tylenol at around 6:30 AM.  "CT and CTA reassuring. No clear cause found." Care performed by Lindsey Bray. Lindsey Munch, MD assumed care and reported, "On my evaluation patient awake, alert, in no distress. I reviewed the MRI, unremarkable."   Today she reports it was a "headache I've never had before", but didn't notice any other symptoms besides right-sided paresthesia.  States she has an upcoming neurology appointment on 11/11.   Past Medical History:  Diagnosis Date    Allergy    as a child and young adult   Arthritis    Oa left shoulder   Asthma    as a child and young adult   Diabetes mellitus without complication (HCC) O5-2022    Social History   Tobacco Use   Smoking status: Never   Smokeless tobacco: Never  Vaping Use   Vaping status: Never Used  Substance Use Topics   Alcohol use: No   Drug use: Never   Past Surgical History:  Procedure Laterality Date   BREAST SURGERY Right 27 (age 12)   benign breast tumors   CESAREAN SECTION     x1   COLONOSCOPY  2004   ~16 yrs ago- was normal    trigger thumb  05/2017   r hand   Family History  Problem Relation Age of Onset   Cancer Mother 8       colon- died age 28    Colon cancer Mother    COPD Father    Heart disease Father    Cancer Sister 59       breast   Breast cancer Sister    Cancer Sister 71       breast; had a lumpectomy and is on tamoxifen   Breast cancer Sister    Colon polyps Sister    Rheum arthritis Sister    Esophageal cancer Neg Hx    Rectal cancer Neg Hx    Stomach cancer Neg Hx    Diabetes Neg Hx    No Known  Allergies Current Medications:   Current Outpatient Medications:    albuterol (VENTOLIN HFA) 108 (90 Base) MCG/ACT inhaler, Inhale 2 puffs into the lungs every 6 (six) hours as needed for wheezing or shortness of breath., Disp: 8 g, Rfl: 0   Continuous Glucose Sensor (FREESTYLE LIBRE 3 SENSOR) MISC, Place 1 sensor on the skin every 14 days. Use to check glucose continuously, Disp: 3 each, Rfl: 3   Docusate Sodium (COLACE PO), Take 1 tablet by mouth daily., Disp: , Rfl:    polyethylene glycol (MIRALAX / GLYCOLAX) 17 g packet, Take 17 g by mouth daily as needed for mild constipation., Disp: , Rfl:    rosuvastatin (CRESTOR) 10 MG tablet, Take 1 tablet (10 mg total) by mouth daily., Disp: 90 tablet, Rfl: 3   Semaglutide,0.25 or 0.5MG /DOS, (OZEMPIC, 0.25 OR 0.5 MG/DOSE,) 2 MG/3ML SOPN, Inject 0.5 mg into the skin once a week., Disp: 3 mL, Rfl: 1  Review of  Systems:   ROS See pertinent positives and negatives as per the HPI.  Vitals:   Vitals:   03/10/23 0803  BP: 130/76  Pulse: 73  Temp: 97.7 F (36.5 C)  TempSrc: Temporal  SpO2: 97%  Weight: 145 lb (65.8 kg)  Height: 4\' 11"  (1.499 m)     Body mass index is 29.29 kg/m.  Physical Exam:   Physical Exam Vitals and nursing note reviewed.  Constitutional:      General: She is not in acute distress.    Appearance: She is well-developed. She is not ill-appearing or toxic-appearing.  Cardiovascular:     Rate and Rhythm: Normal rate and regular rhythm.     Pulses: Normal pulses.     Heart sounds: Normal heart sounds, S1 normal and S2 normal.  Pulmonary:     Effort: Pulmonary effort is normal.     Breath sounds: Normal breath sounds.  Skin:    General: Skin is warm and dry.  Neurological:     Mental Status: She is alert.     GCS: GCS eye subscore is 4. GCS verbal subscore is 5. GCS motor subscore is 6.  Psychiatric:        Speech: Speech normal.        Behavior: Behavior normal. Behavior is cooperative.    Results for orders placed or performed in visit on 03/10/23  POCT glycosylated hemoglobin (Hb A1C)  Result Value Ref Range   Hemoglobin A1C 7.6 (A) 4.0 - 5.6 %    Assessment and Plan:   Type 2 diabetes mellitus without complication, without long-term current use of insulin (HCC); Long-term current use of injectable noninsulin antidiabetic medication Above goal Nutrition education provided today - focused on smaller portions of carbohydrates Increase Ozempic to 0.5 mg weekly Follow-up in 6 months ago  Neuropathy Declines intervention such as gabapentin Declines work-up Will defer evaluation to neurology  Scalp alopecia Contact info for dermatology placed today  I,Lindsey Bray,acting as a scribe for Lindsey Motto, PA.,have documented all relevant documentation on the behalf of Lindsey Motto, PA,as directed by  Lindsey Motto, PA while in the presence of  Lindsey Bray, [place].  I, Lindsey Bray, [place], have reviewed all documentation for this visit. The documentation on 03/10/23 for the exam, diagnosis, procedures, and orders are all accurate and complete.  Lindsey Motto, PA-C

## 2023-03-10 NOTE — Patient Instructions (Addendum)
It was great to see you!  Continue to work on [employer] as we discussed.  Increase your Ozempic to 0.5 mg weekly after you complete what is currently at home.  Let's follow-up in 3 months, sooner if you have concerns.  Take care,  Jarold Motto PA-C

## 2023-03-16 ENCOUNTER — Ambulatory Visit: Payer: PPO | Admitting: Neurology

## 2023-03-16 ENCOUNTER — Encounter: Payer: Self-pay | Admitting: Neurology

## 2023-03-16 VITALS — BP 143/73 | HR 100 | Ht 59.0 in | Wt 146.0 lb

## 2023-03-16 DIAGNOSIS — G43909 Migraine, unspecified, not intractable, without status migrainosus: Secondary | ICD-10-CM

## 2023-03-16 DIAGNOSIS — E1142 Type 2 diabetes mellitus with diabetic polyneuropathy: Secondary | ICD-10-CM

## 2023-03-16 NOTE — Progress Notes (Signed)
[facility]  PATIENT: Lindsey Bray DOB: 04-27-1951  REQUESTING CLINICIAN: Jarold Motto, PA HISTORY FROM: Patient and chart review  REASON FOR VISIT: Headaches, Numbness    HISTORICAL  CHIEF COMPLAINT:  Chief Complaint  Patient presents with   New Patient (Initial Visit)    Rm12, alone, ED referral for Paresthesia:bilateral feet but prodominantly right (ongoing 2 years), headache: episodic intensity was 10/10 worse pain she ever experienced which she also experienced tingly/numbness right which caused her to go to urgent care   Discussed the use of AI scribe software for clinical note transcription with the patient, who gave verbal consent to proceed.  History of Present Illness   This is a 72 year old woman, with a known history of diabetes, presented for a ED follow-up visit after experiencing a severe headache a month prior. The headache was described as unusual and severe, waking the patient from sleep around 5:30 AM. The patient denied any similar headaches before or after this event. The patient underwent an MRI and CT angiogram, which were reported as normal, ruling out concerns of bleeding or vasospasm.  The patient has a history of migraines as a teen, typically associated with vision changes and relieved by rest. However, the patient noted that these migraines were different from the severe headache experienced last month. The patient also reported a history of right-sided numbness, which was present during the severe headache episode but has since resolved. The numbness was described as not extreme but noticeable, with the right side not feeling as normal as it should.  The patient's diabetes management was also discussed. The patient's recent HbA1c was 7.6, an increase from previous readings. The patient attributed this increase to dietary changes and acknowledged the need for better diabetes management. The patient is currently on Ozempic, which was  recently increased from 0.25 to 0.5. The patient previously took [place], which was effective in controlling the HbA1c but caused frequent urination that disrupted sleep.  The patient also reported experiencing tingling in the feet, a symptom of diabetic neuropathy. The patient described the sensation as burning at times, particularly at night. The patient expressed a desire to manage this symptom without additional medication if possible.  In summary, the patient presented for follow-up after a severe headache episode, with a history of diabetes and migraines. The patient's diabetes management is a current concern, with recent HbA1c levels indicating a need for better control. The patient also experiences symptoms of diabetic neuropathy, which are currently managed without medication.       OTHER MEDICAL CONDITIONS: Diabetes, Diabetic Neuropathy, Hyperlipidemia,    REVIEW OF SYSTEMS: Full 14 system review of systems performed and negative with exception of: as noted in the HPI   ALLERGIES: No Known Allergies  HOME MEDICATIONS: Outpatient Medications Prior to Visit  Medication Sig Dispense Refill   albuterol (VENTOLIN HFA) 108 (90 Base) MCG/ACT inhaler Inhale 2 puffs into the lungs every 6 (six) hours as needed for wheezing or shortness of breath. 8 g 0   Continuous Glucose Sensor (FREESTYLE LIBRE 3 SENSOR) MISC Place 1 sensor on the skin every 14 days. Use to check glucose continuously 3 each 3   Docusate Sodium (COLACE PO) Take 1 tablet by mouth daily.     polyethylene glycol (MIRALAX / GLYCOLAX) 17 g packet Take 17 g by mouth daily as needed for mild constipation.     rosuvastatin (CRESTOR) 10 MG tablet Take 1 tablet (10 mg total) by mouth daily. 90 tablet 3  Semaglutide,0.25 or 0.5MG /DOS, (OZEMPIC, 0.25 OR 0.5 MG/DOSE,) 2 MG/3ML SOPN Inject 0.5 mg into the skin once a week. 3 mL 1   No facility-administered medications prior to visit.    PAST MEDICAL HISTORY: Past Medical History:   Diagnosis Date   Allergy    as a child and young adult   Arthritis    Oa left shoulder   Asthma    as a child and young adult   Diabetes mellitus without complication (HCC) O5-2022    PAST SURGICAL HISTORY: Past Surgical History:  Procedure Laterality Date   BREAST SURGERY Right 51 (age 43)   benign breast tumors   CESAREAN SECTION     x1   COLONOSCOPY  2004   ~16 yrs ago- was normal    trigger thumb  05/2017   r hand    FAMILY HISTORY: Family History  Problem Relation Age of Onset   Cancer Mother 58       colon- died age 77    Colon cancer Mother    COPD Father    Heart disease Father    Cancer Sister 81       breast   Breast cancer Sister    Cancer Sister 62       breast; had a lumpectomy and is on tamoxifen   Breast cancer Sister    Colon polyps Sister    Rheum arthritis Sister    Esophageal cancer Neg Hx    Rectal cancer Neg Hx    Stomach cancer Neg Hx    Diabetes Neg Hx     SOCIAL HISTORY: Social History   Socioeconomic History   Marital status: Single    Spouse name: Not on file   Number of children: 1   Years of education: Not on file   Highest education level: Bachelor's degree (e.g., BA, AB, BS)  Occupational History   Not on file  Tobacco Use   Smoking status: Never   Smokeless tobacco: Never  Vaping Use   Vaping status: Never Used  Substance and Sexual Activity   Alcohol use: No   Drug use: Never   Sexual activity: Not Currently    Birth control/protection: None  Other Topics Concern   Not on file  Social History Narrative   Retired   Lives by self   Social Determinants of Health   Financial Resource Strain: Low Risk  (03/07/2023)   Overall Financial Resource Strain (CARDIA)    Difficulty of Paying Living Expenses: Not very hard  Food Insecurity: No Food Insecurity (03/07/2023)   Hunger Vital Sign    Worried About Running Out of Food in the Last Year: Never true    Ran Out of Food in the Last Year: Never true   Transportation Needs: No Transportation Needs (03/07/2023)   PRAPARE - Administrator, Civil Service (Medical): No    Lack of Transportation (Non-Medical): No  Physical Activity: Sufficiently Active (03/07/2023)   Exercise Vital Sign    Days of Exercise per Week: 5 days    Minutes of Exercise per Session: 60 min  Stress: No Stress Concern Present (03/07/2023)   [employer] of Occupational Health - Occupational Stress Questionnaire    Feeling of Stress : Not at all  Social Connections: Moderately Integrated (03/07/2023)   Social Connection and Isolation Panel [NHANES]    Frequency of Communication with Friends and Family: More than three times a week    Frequency of Social Gatherings with Friends and Family:  Three times a week    Attends Religious Services: More than 4 times per year    Active Member of Clubs or Organizations: Yes    Attends Banker Meetings: More than 4 times per year    Marital Status: Never married  Intimate Partner Violence: Not At Risk (08/19/2022)   Humiliation, Afraid, Rape, and Kick questionnaire    Fear of Current or Ex-Partner: No    Emotionally Abused: No    Physically Abused: No    Sexually Abused: No    PHYSICAL EXAM  GENERAL EXAM/CONSTITUTIONAL: Vitals:  Vitals:   03/16/23 1311  BP: (!) 143/73  Pulse: 100  Weight: 146 lb (66.2 kg)  Height: 4\' 11"  (1.499 m)   Body mass index is 29.49 kg/m. Wt Readings from Last 3 Encounters:  03/16/23 146 lb (66.2 kg)  03/10/23 145 lb (65.8 kg)  02/03/23 140 lb (63.5 kg)   Patient is in no distress; well developed, nourished and groomed; neck is supple  MUSCULOSKELETAL: Gait, strength, tone, movements noted in Neurologic exam below  NEUROLOGIC: MENTAL STATUS:      No data to display         awake, alert, oriented to person, place and time recent and remote memory intact normal attention and concentration language fluent, comprehension intact, naming intact fund of  knowledge appropriate  CRANIAL NERVE:  2nd, 3rd, 4th, 6th - Visual fields full to confrontation, extraocular muscles intact, no nystagmus 5th - facial sensation symmetric 7th - facial strength symmetric 8th - hearing intact 9th - palate elevates symmetrically, uvula midline 11th - shoulder shrug symmetric 12th - tongue protrusion midline  MOTOR:  normal bulk and tone, full strength in the BUE, BLE  SENSORY:  normal and symmetric to light touch  COORDINATION:  finger-nose-finger, fine finger movements normal  REFLEXES:  deep tendon reflexes present and symmetric  GAIT/STATION:  normal  DIAGNOSTIC DATA (LABS, IMAGING, TESTING) - I reviewed patient records, labs, notes, testing and imaging myself where available.  Lab Results  Component Value Date   WBC 3.2 (L) 02/03/2023   HGB 14.3 02/03/2023   HCT 42.0 02/03/2023   MCV 83.6 02/03/2023   PLT 162 02/03/2023      Component Value Date/Time   NA 139 02/03/2023 0931   NA 143 04/11/2020 1148   K 4.3 02/03/2023 0931   CL 108 02/03/2023 0931   CO2 27 12/08/2022 0910   GLUCOSE 159 (H) 02/03/2023 0931   BUN 13 02/03/2023 0931   BUN 12 04/11/2020 1148   CREATININE 0.70 02/03/2023 0931   CREATININE 0.66 06/27/2022 0931   CALCIUM 9.8 12/08/2022 0910   PROT 7.7 12/08/2022 0910   PROT 7.6 04/11/2020 1148   ALBUMIN 4.4 12/08/2022 0910   ALBUMIN 4.6 04/11/2020 1148   AST 21 12/08/2022 0910   AST 24 06/27/2022 0931   ALT 18 12/08/2022 0910   ALT 21 06/27/2022 0931   ALKPHOS 75 12/08/2022 0910   BILITOT 0.5 12/08/2022 0910   BILITOT 0.5 06/27/2022 0931   GFRNONAA >60 06/27/2022 0931   GFRAA 88 04/11/2020 1148   Lab Results  Component Value Date   CHOL 126 12/08/2022   HDL 53.00 12/08/2022   LDLCALC 61 12/08/2022   TRIG 58.0 12/08/2022   CHOLHDL 2 12/08/2022   Lab Results  Component Value Date   HGBA1C 7.6 (A) 03/10/2023   Lab Results  Component Value Date   VITAMINB12 461 01/03/2022   Lab Results  Component  Value Date  TSH 1.89 12/05/2021    CTA Head and Neck 02/03/2023 No emergent finding. Mild atherosclerosis in the head and neck.   MRI Brain 02/03/2023 Unremarkable MRI of the brain    ASSESSMENT AND PLAN  72 y.o. year old female with history of diabetes, diabetes neuropathy, migraine as a teen who is presenting after her recent ED visit for severe headache. Her workup including brain MRI and CT angiogram did not show any acute abnormality.  Patient episode likely was a complex migraine.  Since these are episodic, she does not need any preventive medication but if she does have another event, I did advise her to go to urgent care and once stable to contact us, at that time we will prescribe her medication.  She voiced understanding continue to follow with PCP regarding the management of your diabetes and follow-up as needed.   1. Migraine without status migrainosus, not intractable, unspecified migraine type   2. Diabetic polyneuropathy associated with type 2 diabetes mellitus [facility])      Patient Instructions  Continue current medications  Consider lidocaine cream at night for neuropathic pain  Return as needed or if symptoms are worse.  No orders of the defined types were placed in this encounter.   No orders of the defined types were placed in this encounter.   Return if symptoms worsen or fail to improve.    Windell Norfolk, MD 03/16/2023, 2:57 PM  [facility] [address] [place], [place] [address] [phone]

## 2023-03-16 NOTE — Patient Instructions (Signed)
Continue current medications  Consider lidocaine cream at night for neuropathic pain  Return as needed or if symptoms are worse.

## 2023-03-19 ENCOUNTER — Encounter: Payer: Self-pay | Admitting: Physician Assistant

## 2023-06-10 ENCOUNTER — Ambulatory Visit: Payer: PPO | Admitting: Physician Assistant

## 2023-06-10 ENCOUNTER — Encounter: Payer: Self-pay | Admitting: Physician Assistant

## 2023-06-10 VITALS — BP 130/72 | HR 71 | Temp 97.4°F | Ht 59.0 in | Wt 143.0 lb

## 2023-06-10 DIAGNOSIS — E785 Hyperlipidemia, unspecified: Secondary | ICD-10-CM | POA: Diagnosis not present

## 2023-06-10 DIAGNOSIS — Z7985 Long-term (current) use of injectable non-insulin antidiabetic drugs: Secondary | ICD-10-CM | POA: Diagnosis not present

## 2023-06-10 DIAGNOSIS — E1169 Type 2 diabetes mellitus with other specified complication: Secondary | ICD-10-CM | POA: Diagnosis not present

## 2023-06-10 DIAGNOSIS — E119 Type 2 diabetes mellitus without complications: Secondary | ICD-10-CM

## 2023-06-10 LAB — BASIC METABOLIC PANEL
BUN: 13 mg/dL (ref 6–23)
CO2: 26 meq/L (ref 19–32)
Calcium: 9.4 mg/dL (ref 8.4–10.5)
Chloride: 105 meq/L (ref 96–112)
Creatinine, Ser: 0.68 mg/dL (ref 0.40–1.20)
GFR: 87.43 mL/min (ref >60.00)
Glucose, Bld: 141 mg/dL — ABNORMAL HIGH (ref 70–99)
Potassium: 3.9 meq/L (ref 3.5–5.1)
Sodium: 139 meq/L (ref 135–145)

## 2023-06-10 LAB — POCT GLYCOSYLATED HEMOGLOBIN (HGB A1C): Hemoglobin A1C: 7.5 % — AB (ref 4.0–5.6)

## 2023-06-10 MED ORDER — SEMAGLUTIDE (1 MG/DOSE) 4 MG/3ML ~~LOC~~ SOPN: 1.0000 mg | PEN_INJECTOR | SUBCUTANEOUS | 1 refills | Status: DC

## 2023-06-10 NOTE — Progress Notes (Signed)
 Lindsey Bray is a 73 y.o. female here for a follow up of a pre-existing problem.  History of Present Illness:   Chief Complaint  Patient presents with  . Medical Management of Chronic Issues    Diabetes, Hyperlipidemia    HPI  Diabetes  Reports compliance and good tolerance of Ozempic  0.5 mg weekly. Does report experiencing some GI side effects previously but has adjusted to it and thus she denies any GI symptoms at this time. She is interested in trying out an increase dose briefly, will see how she tolerates side effects.  She has been taking colace, prune juice, and Miralax  to avoid associated constipation.  She has been using Libre to maintain glucose levels but states app glucose levels are drastically different than manual glucose levels.  She states that she was waken up by low glucose levels on several nights.  She states that dexcom was not covered by insurance.  Denies hypoglycemic or hyperglycemic episodes or symptoms.  Continues to exercise. A1c today is 7.5, previously 7.6.   HLD  Reports compliance and good tolerance of Crestor  10 mg.  No concerns or symptoms at this time.  Past Medical History:  Diagnosis Date  . Allergy    as a child and young adult  . Arthritis    Oa left shoulder  . Asthma    as a child and young adult  . Diabetes mellitus without complication (HCC) O5-2022     Social History   Tobacco Use  . Smoking status: Never  . Smokeless tobacco: Never  Vaping Use  . Vaping status: Never Used  Substance Use Topics  . Alcohol use: No  . Drug use: Never    Past Surgical History:  Procedure Laterality Date  . BREAST SURGERY Right 81 (age 73)   benign breast tumors  . CESAREAN SECTION     x1  . COLONOSCOPY  2004   ~16 yrs ago- was normal   . trigger thumb  05/2017   r hand    Family History  Problem Relation Age of Onset  . Cancer Mother 81       colon- died age 68   . Colon cancer Mother   . COPD Father   . Heart disease  Father   . Cancer Sister 32       breast  . Breast cancer Sister   . Cancer Sister 31       breast; had a lumpectomy and is on tamoxifen  . Breast cancer Sister   . Colon polyps Sister   . Rheum arthritis Sister   . Esophageal cancer Neg Hx   . Rectal cancer Neg Hx   . Stomach cancer Neg Hx   . Diabetes Neg Hx     No Known Allergies  Current Medications:   Current Outpatient Medications:  .  albuterol  (VENTOLIN  HFA) 108 (90 Base) MCG/ACT inhaler, Inhale 2 puffs into the lungs every 6 (six) hours as needed for wheezing or shortness of breath., Disp: 8 g, Rfl: 0 .  Continuous Glucose Sensor (FREESTYLE LIBRE 3 SENSOR) MISC, Place 1 sensor on the skin every 14 days. Use to check glucose continuously, Disp: 3 each, Rfl: 3 .  Docusate Sodium (COLACE PO), Take 1 tablet by mouth daily., Disp: , Rfl:  .  polyethylene glycol (MIRALAX  / GLYCOLAX ) 17 g packet, Take 17 g by mouth daily as needed for mild constipation., Disp: , Rfl:  .  rosuvastatin  (CRESTOR ) 10 MG tablet, Take 1 tablet (  10 mg total) by mouth daily., Disp: 90 tablet, Rfl: 3 .  Semaglutide , 1 MG/DOSE, 4 MG/3ML SOPN, Inject 1 mg as directed once a week., Disp: 3 mL, Rfl: 1   Review of Systems:   ROS Negative unless otherwise specified per HPI.  Vitals:   Vitals:   06/10/23 0803  BP: 130/72  Pulse: 71  Temp: (!) 97.4 F (36.3 C)  TempSrc: Temporal  SpO2: 99%  Weight: 143 lb (64.9 kg)  Height: 4' 11 (1.499 m)     Body mass index is 28.88 kg/m.  Physical Exam:   Physical Exam Vitals and nursing note reviewed.  Constitutional:      General: She is not in acute distress.    Appearance: She is well-developed. She is not ill-appearing or toxic-appearing.  Cardiovascular:     Rate and Rhythm: Normal rate and regular rhythm.     Pulses: Normal pulses.     Heart sounds: Normal heart sounds, S1 normal and S2 normal.  Pulmonary:     Effort: Pulmonary effort is normal.     Breath sounds: Normal breath sounds.   Skin:    General: Skin is warm and dry.  Neurological:     Mental Status: She is alert.     GCS: GCS eye subscore is 4. GCS verbal subscore is 5. GCS motor subscore is 6.  Psychiatric:        Speech: Speech normal.        Behavior: Behavior normal. Behavior is cooperative.    Results for orders placed or performed in visit on 06/10/23  POCT glycosylated hemoglobin (Hb A1C)  Result Value Ref Range   Hemoglobin A1C 7.5 (A) 4.0 - 5.6 %    Assessment and Plan:   Type 2 diabetes mellitus without complication, without long-term current use of insulin  (HCC) Overall unchanged She would like to increase her Ozempic  to 1 mg weekly temporarily to see if this can help her symptom(s) Follow-up in 3 month(s), sooner if concerns Discussed continued efforts working on diet and exercise  Hyperlipidemia associated with type 2 diabetes mellitus (HCC) Update lipid panel and adjust crestor  10 mg daily as indicated   Lucie Buttner, PA-C  I,Safa M Kadhim,acting as a scribe for [employer], PA.,have documented all relevant documentation on the behalf of Lucie Buttner, PA,as directed by  Lucie Buttner, PA while in the presence of Lucie Buttner, [place].   I, Lucie Buttner, [place], have reviewed all documentation for this visit. The documentation on 06/10/23 for the exam, diagnosis, procedures, and orders are all accurate and complete.

## 2023-06-10 NOTE — Patient Instructions (Signed)
 It was great to see you!  Increase Ozempic  1 mg weekly  Let's follow-up in 3 months, sooner if you have concerns.  Take care,  Alexander Iba PA-C

## 2023-06-11 ENCOUNTER — Encounter: Payer: Self-pay | Admitting: Physician Assistant

## 2023-07-29 ENCOUNTER — Other Ambulatory Visit: Payer: Self-pay | Admitting: Physician Assistant

## 2023-08-24 ENCOUNTER — Ambulatory Visit (INDEPENDENT_AMBULATORY_CARE_PROVIDER_SITE_OTHER): Payer: HMO

## 2023-08-24 VITALS — BP 110/64 | HR 87 | Temp 97.3°F | Ht 59.0 in | Wt 140.8 lb

## 2023-08-24 DIAGNOSIS — Z0000 Encounter for general adult medical examination without abnormal findings: Secondary | ICD-10-CM | POA: Diagnosis not present

## 2023-08-24 NOTE — Patient Instructions (Signed)
 Ms. Buehring , Thank you for taking time to come for your Medicare Wellness Visit. I appreciate your ongoing commitment to your health goals. Please review the following plan we discussed and let me know if I can assist you in the future.   Referrals/Orders/Follow-Ups/Clinician Recommendations: maintain health and activity   This is a list of the screening recommended for you and due dates:  Health Maintenance  Topic Date Due   Complete foot exam   03/13/2023   Medicare Annual Wellness Visit  08/19/2023   COVID-19 Vaccine (10 - Pfizer risk 2024-25 season) 06/09/2024*   Flu Shot  12/04/2023   Yearly kidney health urinalysis for diabetes  12/08/2023   Hemoglobin A1C  12/08/2023   Eye exam for diabetics  01/19/2024   Yearly kidney function blood test for diabetes  06/09/2024   Mammogram  01/07/2025   Colon Cancer Screening  12/21/2025   DTaP/Tdap/Td vaccine (2 - Td or Tdap) 09/07/2032   Pneumonia Vaccine  Completed   DEXA scan (bone density measurement)  Completed   Hepatitis C Screening  Completed   Zoster (Shingles) Vaccine  Completed   HPV Vaccine  Aged Out   Meningitis B Vaccine  Aged Out  *Topic was postponed. The date shown is not the original due date.    Advanced directives: (Copy Requested) Please bring a copy of your health care power of attorney and living will to the office to be added to your chart at your convenience. You can mail to [facility] [address] [place], [place] [address] or email to [email or web address]  Next Medicare Annual Wellness Visit scheduled for next year: Yes

## 2023-08-24 NOTE — Progress Notes (Signed)
 Subjective:   Angelin A Ridings is a 73 y.o. who presents for a Medicare Wellness preventive visit.  Visit Complete: In person    Persons Participating in Visit: Patient.  AWV Questionnaire: No: Patient Medicare AWV questionnaire was not completed prior to this visit.  Cardiac Risk Factors include: advanced age (>56men, >75 women);diabetes mellitus;dyslipidemia     Objective:    Today's Vitals   08/24/23 0806  BP: 110/64  Pulse: 87  Temp: (!) 97.3 F (36.3 C)  SpO2: 95%  Weight: 140 lb 12.8 oz (63.9 kg)  Height: 4\' 11"  (1.499 m)   Body mass index is 28.44 kg/m.     08/24/2023    8:14 AM 02/03/2023    9:04 AM 08/19/2022    8:14 AM 04/10/2021    3:22 AM 10/30/2020   11:32 AM 12/22/2018   11:12 AM 09/23/2018   11:05 AM  Advanced Directives  Does Patient Have a Medical Advance Directive? Yes No No No Yes Yes Yes  Type of Estate agent of [place];Living will    Living will;Healthcare Power of [employer] Power of [place];Living will Healthcare Power of [place];Living will  Does patient want to make changes to medical advance directive?       No - Patient declined  Copy of Healthcare Power of Attorney in Chart? No - copy requested      No - copy requested  Would patient like information on creating a medical advance directive?   No - Patient declined No - Patient declined       Current Medications (verified) Outpatient Encounter Medications as of 08/24/2023  Medication Sig   albuterol  (VENTOLIN  HFA) 108 (90 Base) MCG/ACT inhaler Inhale 2 puffs into the lungs every 6 (six) hours as needed for wheezing or shortness of breath.   Continuous Glucose Sensor (FREESTYLE LIBRE 3 SENSOR) MISC PLACE 1 SENSOR ON THE SKIN EVERY 14 DAYS USE TO CHECK GLUCOSE CONTINUOUSLY   Docusate Sodium (COLACE PO) Take 1 tablet by mouth daily.   OZEMPIC , 1 MG/DOSE, 4 MG/3ML SOPN INJECT 1 MG AS DIRECTED ONCE A WEEK   polyethylene glycol (MIRALAX  / GLYCOLAX ) 17 g packet Take  17 g by mouth daily as needed for mild constipation.   rosuvastatin  (CRESTOR ) 10 MG tablet Take 1 tablet (10 mg total) by mouth daily.   No facility-administered encounter medications on file as of 08/24/2023.    Allergies (verified) Patient has no known allergies.   History: Past Medical History:  Diagnosis Date   Allergy    as a child and young adult   Arthritis    Oa left shoulder   Asthma    as a child and young adult   Diabetes mellitus without complication (HCC) O5-2022   Past Surgical History:  Procedure Laterality Date   BREAST SURGERY Right 88 (age 50)   benign breast tumors   CESAREAN SECTION     x1   COLONOSCOPY  2004   ~16 yrs ago- was normal    trigger thumb  05/2017   r hand   Family History  Problem Relation Age of Onset   Cancer Mother 78       colon- died age 22    Colon cancer Mother    COPD Father    Heart disease Father    Cancer Sister 4       breast   Breast cancer Sister    Cancer Sister 52       breast; had a lumpectomy  and is on tamoxifen   Breast cancer Sister    Colon polyps Sister    Rheum arthritis Sister    Esophageal cancer Neg Hx    Rectal cancer Neg Hx    Stomach cancer Neg Hx    Diabetes Neg Hx    Social History   Socioeconomic History   Marital status: Single    Spouse name: Not on file   Number of children: 1   Years of education: Not on file   Highest education level: Bachelor's degree (e.g., BA, AB, BS)  Occupational History   Not on file  Tobacco Use   Smoking status: Never   Smokeless tobacco: Never  Vaping Use   Vaping status: Never Used  Substance and Sexual Activity   Alcohol use: No   Drug use: Never   Sexual activity: Not Currently    Birth control/protection: None  Other Topics Concern   Not on file  Social History Narrative   Retired   Lives by self   Social Drivers of Health   Financial Resource Strain: Low Risk  (08/24/2023)   Overall Financial Resource Strain (CARDIA)    Difficulty of  Paying Living Expenses: Not hard at all  Food Insecurity: No Food Insecurity (08/24/2023)   Hunger Vital Sign    Worried About Running Out of Food in the Last Year: Never true    Ran Out of Food in the Last Year: Never true  Transportation Needs: No Transportation Needs (08/24/2023)   PRAPARE - Administrator, Civil Service (Medical): No    Lack of Transportation (Non-Medical): No  Physical Activity: Sufficiently Active (08/24/2023)   Exercise Vital Sign    Days of Exercise per Week: 5 days    Minutes of Exercise per Session: 60 min  Stress: No Stress Concern Present (08/24/2023)   [employer] of Occupational Health - Occupational Stress Questionnaire    Feeling of Stress : Not at all  Social Connections: Moderately Integrated (08/24/2023)   Social Connection and Isolation Panel [NHANES]    Frequency of Communication with Friends and Family: More than three times a week    Frequency of Social Gatherings with Friends and Family: More than three times a week    Attends Religious Services: More than 4 times per year    Active Member of [employer] or Organizations: Yes    Attends Banker Meetings: 1 to 4 times per year    Marital Status: Never married    Tobacco Counseling Counseling given: Not Answered    Clinical Intake:  Pre-visit preparation completed: Yes  Pain : No/denies pain     BMI - recorded: 28.44 Nutritional Status: BMI 25 -29 Overweight Diabetes: Yes CBG done?: Yes (162 at AWV appt) CBG resulted in Enter/ Edit results?: No Did pt. bring in CBG monitor from home?: No  Lab Results  Component Value Date   HGBA1C 7.5 (A) 06/10/2023   HGBA1C 7.6 (A) 03/10/2023   HGBA1C 6.8 (A) 10/24/2022     How often do you need to have someone help you when you read instructions, pamphlets, or other written materials from your doctor or pharmacy?: 1 - Never  Interpreter Needed?: No  Information entered by :: Lamont Pilsner, LPN   Activities of  Daily Living     08/24/2023    8:08 AM  In your present state of health, do you have any difficulty performing the following activities:  Hearing? 1  Comment HOH  Vision? 0  Difficulty  concentrating or making decisions? 0  Walking or climbing stairs? 0  Dressing or bathing? 0  Doing errands, shopping? 0  Preparing Food and eating ? N  Using the Toilet? N  In the past six months, have you accidently leaked urine? N  Do you have problems with loss of bowel control? N  Managing your Medications? N  Managing your Finances? N  Housekeeping or managing your Housekeeping? N    Patient Care Team: Alexander Iba, [place] as PCP - General (Physician Assistant) Janeth Medicus, MD as Consulting Physician (Orthopedic Surgery) Bonita Bussing, MD as Consulting Physician (Obstetrics and Gynecology) [facility], [place] as Consulting Physician (Optometry)  Indicate any recent Medical Services you may have received from other than [facility] providers in the past year (date may be approximate).     Assessment:   This is a routine wellness examination for [place].  Hearing/Vision screen Hearing Screening - Comments:: Pt stated HOH no hearing aids  Vision Screening - Comments:: Wears rx glasses - up to date with routine eye exams with [facility]     Goals Addressed             This Visit's Progress    Patient Stated       Maintain health and activity        Depression Screen     08/24/2023    8:10 AM 06/10/2023    8:05 AM 03/10/2023    8:05 AM 12/08/2022    8:31 AM 10/24/2022   11:16 AM 08/19/2022    8:11 AM 06/12/2022    8:08 AM  PHQ 2/9 Scores  PHQ - 2 Score 0 0 0 0 0 0 0  PHQ- 9 Score   1        Fall Risk     08/24/2023    8:15 AM 10/24/2022   11:16 AM 08/18/2022    6:41 AM 12/05/2021    8:07 AM 04/22/2021    1:23 PM  Fall Risk   Falls in the past year? 0 0 0 0 0  Number falls in past yr: 0 0 0 0 0  Injury with Fall? 0 0 0 0 0  Risk for fall due to : No Fall Risks   Impaired vision No Fall Risks   Follow up Falls prevention discussed  Falls prevention discussed Falls evaluation completed Falls evaluation completed    MEDICARE RISK AT HOME:  Medicare Risk at Home Any stairs in or around the home?: Yes If so, are there any without handrails?: No Home free of loose throw rugs in walkways, pet beds, electrical cords, etc?: Yes Adequate lighting in your home to reduce risk of falls?: Yes Life alert?: No Use of a cane, walker or w/c?: No Grab bars in the bathroom?: No Shower chair or bench in shower?: Yes Elevated toilet seat or a handicapped toilet?: No  TIMED UP AND GO:  Was the test performed?  Yes  Length of time to ambulate 10 feet: 10 sec Gait steady and fast without use of assistive device  Cognitive Function: 6CIT completed        08/24/2023    8:15 AM 08/19/2022    8:17 AM 09/23/2018   11:07 AM  6CIT Screen  What Year? 0 points 0 points 0 points  What month? 0 points 0 points 0 points  What time? 0 points 0 points 0 points  Count back from 20 0 points 0 points 0 points  Months in reverse 0  points 0 points 0 points  Repeat phrase 0 points 0 points 0 points  Total Score 0 points 0 points 0 points    Immunizations Immunization History  Administered Date(s) Administered   Influenza,inj,Quad PF,6+ Mos 04/06/2016   PFIZER Comirnaty(Gray Top)Covid-19 Tri-Sucrose Vaccine 08/14/2020   PFIZER(Purple Top)SARS-COV-2 Vaccination 05/27/2019, 06/17/2019, 02/06/2020, 08/14/2020, 02/01/2022   Pfizer Covid-19 Vaccine Bivalent Booster 7yrs & up 01/15/2021, 10/08/2021   Pfizer(Comirnaty)Fall Seasonal Vaccine 12 years and older 02/11/2023   Pneumococcal Conjugate-13 11/29/2018   Pneumococcal Polysaccharide-23 04/11/2020   Tdap 09/08/2022   Zoster Recombinant(Shingrix) 01/09/2023, 03/11/2023    Screening Tests Health Maintenance  Topic Date Due   FOOT EXAM  03/13/2023   COVID-19 Vaccine (10 - Pfizer risk 2024-25 season) 06/09/2024  (Originally 08/12/2023)   INFLUENZA VACCINE  12/04/2023   Diabetic kidney evaluation - Urine ACR  12/08/2023   HEMOGLOBIN A1C  12/08/2023   OPHTHALMOLOGY EXAM  01/19/2024   Diabetic kidney evaluation - eGFR measurement  06/09/2024   Medicare Annual Wellness (AWV)  08/23/2024   MAMMOGRAM  01/07/2025   Colonoscopy  12/21/2025   DTaP/Tdap/Td (2 - Td or Tdap) 09/07/2032   Pneumonia Vaccine 55+ Years old  Completed   DEXA SCAN  Completed   Hepatitis C Screening  Completed   Zoster Vaccines- Shingrix  Completed   HPV VACCINES  Aged Out   Meningococcal B Vaccine  Aged Out    Health Maintenance  Health Maintenance Due  Topic Date Due   FOOT EXAM  03/13/2023   Health Maintenance Items Addressed: See Nurse Notes  Additional Screening:  Vision Screening: Recommended annual ophthalmology exams for early detection of glaucoma and other disorders of the eye.  Dental Screening: Recommended annual dental exams for proper oral hygiene  Community Resource Referral / Chronic Care Management: CRR required this visit?  No   CCM required this visit?  No     Plan:     I have personally reviewed and noted the following in the patient's chart:   Medical and social history Use of alcohol, tobacco or illicit drugs  Current medications and supplements including opioid prescriptions. Patient is not currently taking opioid prescriptions. Functional ability and status Nutritional status Physical activity Advanced directives List of other physicians Hospitalizations, surgeries, and ER visits in previous 12 months Vitals Screenings to include cognitive, depression, and falls Referrals and appointments  In addition, I have reviewed and discussed with patient certain preventive protocols, quality metrics, and best practice recommendations. A written personalized care plan for preventive services as well as general preventive health recommendations were provided to patient.     Bruno Capri, LPN   [id number]/7846   After Visit Summary: (In Person-Printed) AVS printed and given to the patient  Notes: Nothing significant to report at this time.

## 2023-09-02 ENCOUNTER — Other Ambulatory Visit (HOSPITAL_COMMUNITY): Payer: Self-pay

## 2023-09-02 ENCOUNTER — Telehealth: Payer: Self-pay

## 2023-09-02 NOTE — Telephone Encounter (Signed)
 Pharmacy Patient Advocate Encounter   Received notification from CoverMyMeds that prior authorization for Ozempic  (1 MG/DOSE) 4MG /3ML pen-injectors is required/requested.   Insurance verification completed.   The patient is insured through [facility] ADVANTAGE/RX ADVANCE .   Per test claim: PA required; PA submitted to above mentioned insurance via CoverMyMeds Key/confirmation #/EOC [id number] Status is pending

## 2023-09-04 ENCOUNTER — Other Ambulatory Visit (HOSPITAL_COMMUNITY): Payer: Self-pay

## 2023-09-04 NOTE — Telephone Encounter (Signed)
 Pharmacy Patient Advocate Encounter  Received notification from [employer] that Prior Authorization for Ozempic  (1 MG/DOSE) 4MG /3ML pen-injectors has been APPROVED from 09/02/23 to 09/01/24. Ran test claim, Copay is $0. This test claim was processed through [facility]- copay amounts may vary at other pharmacies due to pharmacy/plan contracts, or as the patient moves through the different stages of their insurance plan.   PA #/Case ID/Reference #: [id number]

## 2023-09-06 ENCOUNTER — Other Ambulatory Visit: Payer: Self-pay | Admitting: Physician Assistant

## 2023-09-07 ENCOUNTER — Ambulatory Visit (INDEPENDENT_AMBULATORY_CARE_PROVIDER_SITE_OTHER): Payer: PPO | Admitting: Physician Assistant

## 2023-09-07 ENCOUNTER — Other Ambulatory Visit (HOSPITAL_COMMUNITY): Payer: Self-pay

## 2023-09-07 ENCOUNTER — Encounter: Payer: Self-pay | Admitting: Physician Assistant

## 2023-09-07 ENCOUNTER — Telehealth: Payer: Self-pay | Admitting: *Deleted

## 2023-09-07 VITALS — BP 120/74 | HR 70 | Temp 97.2°F | Ht 59.0 in | Wt 140.4 lb

## 2023-09-07 DIAGNOSIS — H6121 Impacted cerumen, right ear: Secondary | ICD-10-CM | POA: Diagnosis not present

## 2023-09-07 DIAGNOSIS — E1169 Type 2 diabetes mellitus with other specified complication: Secondary | ICD-10-CM | POA: Diagnosis not present

## 2023-09-07 DIAGNOSIS — Z7985 Long-term (current) use of injectable non-insulin antidiabetic drugs: Secondary | ICD-10-CM | POA: Diagnosis not present

## 2023-09-07 DIAGNOSIS — E119 Type 2 diabetes mellitus without complications: Secondary | ICD-10-CM

## 2023-09-07 DIAGNOSIS — E785 Hyperlipidemia, unspecified: Secondary | ICD-10-CM | POA: Diagnosis not present

## 2023-09-07 LAB — COMPREHENSIVE METABOLIC PANEL WITH GFR
ALT: 23 U/L (ref 0–35)
AST: 28 U/L (ref 0–37)
Albumin: 4.4 g/dL (ref 3.5–5.2)
Alkaline Phosphatase: 71 U/L (ref 39–117)
BUN: 14 mg/dL (ref 6–23)
CO2: 28 meq/L (ref 19–32)
Calcium: 9.6 mg/dL (ref 8.4–10.5)
Chloride: 106 meq/L (ref 96–112)
Creatinine, Ser: 0.72 mg/dL (ref 0.40–1.20)
GFR: 83.79 mL/min (ref >60.00)
Glucose, Bld: 97 mg/dL (ref 70–99)
Potassium: 4.7 meq/L (ref 3.5–5.1)
Sodium: 141 meq/L (ref 135–145)
Total Bilirubin: 0.7 mg/dL (ref 0.2–1.2)
Total Protein: 7.7 g/dL (ref 6.0–8.3)

## 2023-09-07 LAB — LIPID PANEL
Cholesterol: 121 mg/dL (ref 0–200)
HDL: 49.2 mg/dL (ref >39.00)
LDL Cholesterol: 56 mg/dL (ref 0–99)
NonHDL: 71.82
Total CHOL/HDL Ratio: 2
Triglycerides: 78 mg/dL (ref 0.0–149.0)
VLDL: 15.6 mg/dL (ref 0.0–40.0)

## 2023-09-07 LAB — HEMOGLOBIN A1C: Hgb A1c MFr Bld: 6.7 % — ABNORMAL HIGH (ref 4.6–6.5)

## 2023-09-07 MED ORDER — FREESTYLE LIBRE 3 PLUS SENSOR MISC: 5 refills | Status: AC

## 2023-09-07 NOTE — Progress Notes (Signed)
 Lindsey Bray is a 73 y.o. female here for a follow up of a pre-existing problem.  History of Present Illness:   Chief Complaint  Patient presents with   Medical Management of Chronic Issues    Diabetes, Hyperlipidemia    HPI  Type 2 diabetes mellitus f/u Pt is on Ozempic  1 mg injection weekly. Pt endorses continued exercise for weight loss management. She reports decreased appetite and nausea when eating, but that she is able to manage it on her own. Pt also reports decreased constipation after taxing Miralax  and prune juice. Pt states her Ozempic  has expired and would like to renew it. She would also like a refill on [employer] 3. She is agreeable to decreasing Ozempic  dosage if her A1c is below 6.5 today.  Hyperlipidemia associated with type 2 diabetes mellitus Pt is on Rosuvastatin  10 mg once daily. Good compliance and tolerance. No other acute concerns reported at this time.  Ear wax buildup Pt reports ear wax buildup in right ear. Pt states she has hx of ear wax buildup. She would like to have this buildup removed today.  Past Medical History:  Diagnosis Date   Allergy    as a child and young adult   Arthritis    Oa left shoulder   Asthma    as a child and young adult   Diabetes mellitus without complication (HCC) O5-2022     Social History   Tobacco Use   Smoking status: Never   Smokeless tobacco: Never  Vaping Use   Vaping status: Never Used  Substance Use Topics   Alcohol use: No   Drug use: Never    Past Surgical History:  Procedure Laterality Date   BREAST SURGERY Right 23 (age 63)   benign breast tumors   CESAREAN SECTION     x1   COLONOSCOPY  2004   ~16 yrs ago- was normal    trigger thumb  05/2017   r hand    Family History  Problem Relation Age of Onset   Cancer Mother 42       colon- died age 68    Colon cancer Mother    COPD Father    Heart disease Father    Cancer Sister 57       breast   Breast cancer Sister     Cancer Sister 69       breast; had a lumpectomy and is on tamoxifen   Breast cancer Sister    Colon polyps Sister    Rheum arthritis Sister    Esophageal cancer Neg Hx    Rectal cancer Neg Hx    Stomach cancer Neg Hx    Diabetes Neg Hx     No Known Allergies  Current Medications:   Current Outpatient Medications:    albuterol  (VENTOLIN  HFA) 108 (90 Base) MCG/ACT inhaler, Inhale 2 puffs into the lungs every 6 (six) hours as needed for wheezing or shortness of breath., Disp: 8 g, Rfl: 0   Continuous Glucose Sensor (FREESTYLE LIBRE 3 PLUS SENSOR) MISC, Change sensor every 15 days., Disp: 2 each, Rfl: 5   Docusate Sodium (COLACE PO), Take 1 tablet by mouth daily., Disp: , Rfl:    OZEMPIC , 1 MG/DOSE, 4 MG/3ML SOPN, INJECT 1 MG AS DIRECTED ONCE A WEEK, Disp: 3 mL, Rfl: 1   polyethylene glycol (MIRALAX  / GLYCOLAX ) 17 g packet, Take 17 g by mouth daily as needed for mild constipation., Disp: , Rfl:    rosuvastatin  (  CRESTOR ) 10 MG tablet, Take 1 tablet (10 mg total) by mouth daily., Disp: 90 tablet, Rfl: 3   Review of Systems:   Negative unless otherwise specified per HPI.  Vitals:   Vitals:   09/07/23 0820  BP: 120/74  Pulse: 70  Temp: (!) 97.2 F (36.2 C)  TempSrc: Temporal  SpO2: 97%  Weight: 140 lb 6.1 oz (63.7 kg)  Height: 4\' 11"  (1.499 m)     Body mass index is 28.35 kg/m.  Physical Exam:   Physical Exam Vitals and nursing note reviewed.  Constitutional:      General: She is not in acute distress.    Appearance: She is well-developed. She is not ill-appearing or toxic-appearing.  HENT:     Head: Normocephalic and atraumatic.     Right Ear: Tympanic membrane, ear canal and external ear normal. There is impacted cerumen.     Left Ear: Tympanic membrane, ear canal and external ear normal. Tympanic membrane is not erythematous, retracted or bulging.     Nose: Nose normal.     Right Sinus: No maxillary sinus tenderness or frontal sinus tenderness.     Left Sinus:  No maxillary sinus tenderness or frontal sinus tenderness.     Mouth/Throat:     Pharynx: Uvula midline. No posterior oropharyngeal erythema.  Eyes:     General: Lids are normal.     Conjunctiva/sclera: Conjunctivae normal.  Neck:     Trachea: Trachea normal.  Cardiovascular:     Rate and Rhythm: Normal rate and regular rhythm.     Heart sounds: Normal heart sounds, S1 normal and S2 normal.  Pulmonary:     Effort: Pulmonary effort is normal.     Breath sounds: Normal breath sounds. No decreased breath sounds, wheezing, rhonchi or rales.  Lymphadenopathy:     Cervical: No cervical adenopathy.  Skin:    General: Skin is warm and dry.  Neurological:     Mental Status: She is alert.  Psychiatric:        Speech: Speech normal.        Behavior: Behavior normal. Behavior is cooperative.    Ceruminosis is noted.  Wax is removed by syringing and manual debridement.   Assessment and Plan:   1. Type 2 diabetes mellitus without complication, without long-term current use of insulin  (HCC) (Primary) Update Hemoglobin A1c and provide recommendations to Ozempic  1 mg weekly accordingly Follow up in Aug for Comprehensive Physical Exam (CPE) preventive care annual visit -- sooner if concerns - Hemoglobin A1c - Comprehensive metabolic panel with GFR  2. Hyperlipidemia associated with type 2 diabetes mellitus (HCC) Update lipid panel and adjust crestor  10 mg daily - Lipid panel  3. Right ear impacted cerumen Tolerated procedure well No evidence of infection requiring antibiotic(s) on my exam Continue efforts at keeping ears clean  I, Timoteo Force, acting as a Neurosurgeon for [employer], [place]., have documented all relevant documentation on the behalf of Alexander Iba, [place], as directed by  Alexander Iba, PA while in the presence of Alexander Iba, [place].  I, Alexander Iba, [place], have reviewed all documentation for this visit. The documentation on 09/07/23 for the exam, diagnosis, procedures, and  orders are all accurate and complete.  Alexander Iba, PA-C

## 2023-09-07 NOTE — Patient Instructions (Signed)
 It was great to see you!  Keep up the good work! I will be in touch with results and recommendations based on labs.  Let's follow-up after Aug 5 for Comprehensive Physical Exam (CPE) preventive care annual visit, sooner if you have concerns.  Take care,  Alexander Iba PA-C

## 2023-09-07 NOTE — Telephone Encounter (Signed)
 Please do PA for Ozempic  1 mg, PA has expired.

## 2023-09-07 NOTE — Telephone Encounter (Signed)
 Spoke to pt told her PA for Ozempic  was approved 09/02/2023 for one year.

## 2023-09-08 ENCOUNTER — Encounter: Payer: Self-pay | Admitting: Physician Assistant

## 2023-09-15 ENCOUNTER — Ambulatory Visit: Payer: Self-pay | Admitting: Physician Assistant

## 2023-09-15 NOTE — Telephone Encounter (Signed)
 FYI, see message.

## 2023-09-15 NOTE — Telephone Encounter (Signed)
 Chief Complaint: Fluctuating blood sugar levels since last night  Symptoms: No symptoms Pertinent Negatives: Patient denies dizziness, vomiting  Disposition:  [x] Home Care  Additional Notes: Patient states last night her blood sugar went lower than it normally does ("40 something"). Pt current blood sugar level is 90. Pt states she wanted to make Lindsey Bray, [place], aware. Pt refused making an appointment at this time and wants to see how her blood sugar does today. This RN educated pt on when to call back/seek emergent care. Pt verbalized understanding and agrees to plan.    Copied from CRM [id number]. Topic: Clinical - Red Word Triage >> Sep 15, 2023 10:12 AM [place] J wrote: [facility] that prompted transfer to Nurse Triage: Patient's blood sugar levels have been fluctuating since last night. Patient's reader would report her level being at 90 but drop to 53. Reason for Disposition  [1] Blood glucose 70 mg/dl (3.9 mmol/l) or below, OR symptomatic AND [2] cause known  Answer Assessment - Initial Assessment Questions SYMPTOMS: "What symptoms are you concerned about?"     Blood sugar keeps going down since last night; 55 a few minutes ago now 72 ONSET:  "When did the symptoms start?"     Last night BLOOD GLUCOSE: "What is your blood glucose level?"      90 now; lowest was 40 something during night USUAL RANGE: "What is your blood glucose level usually?" (e.g., usual fasting morning value, usual evening value)     111-130 TYPE 1 or 2:  "Do you know what type of diabetes you have?"  (e.g., Type 1, Type 2, Gestational; doesn't know)      Type 2 INSULIN : "Do you take insulin ?" "What type of insulin (s) do you use? What is the mode of delivery? (syringe, pen; injection or pump) "When did you last give yourself an insulin  dose?" (i.e., time or hours/minutes ago) "How much did you give?" (i.e., how many units)     Yes, ozempic   DIABETES PILLS: "Do you take any pills for your diabetes?" If Yes, ask:  "What is the name of the medicine(s) that you take for high blood sugar?"     No OTHER SYMPTOMS: "Do you have any symptoms?" (e.g., fever, frequent urination, difficulty breathing, vomiting)     Denies FOOD: "When did you last eat or drink?"       Just ate eggs, toast with jelly, applesauce 30 minutes ago  Protocols used: Diabetes - Low Blood Sugar-A-AH

## 2023-09-17 ENCOUNTER — Encounter: Payer: Self-pay | Admitting: Physician Assistant

## 2023-09-17 ENCOUNTER — Ambulatory Visit: Admitting: Physician Assistant

## 2023-09-17 VITALS — BP 120/74 | HR 74 | Temp 97.9°F | Ht 59.0 in | Wt 142.0 lb

## 2023-09-17 DIAGNOSIS — E119 Type 2 diabetes mellitus without complications: Secondary | ICD-10-CM

## 2023-09-17 LAB — POCT GLUCOSE (DEVICE FOR HOME USE): Glucose Fasting, POC: 123 mg/dL — AB (ref 70–99)

## 2023-09-17 NOTE — Patient Instructions (Addendum)
 It was great to see you!  Continue efforts at frequent meals Message me if any further concerns  Take care,  Ahmaad Neidhardt PA-C

## 2023-09-17 NOTE — Progress Notes (Signed)
 Lindsey Bray is a 73 y.o. female here for a follow up of a pre-existing problem.  History of Present Illness:   Chief Complaint  Patient presents with   Diabetes    Pt says sugars are fluctuating anywhere from 54 to 115, Monday and Tuesday. Has some lightheadedness, other wise feels okay.      Diabetes Current DM meds: Ozempic  1 mg weekly She has been wearing a freestyle libre glucometer for the past month and a half and she is concerned that her current sensor is providing abnormal readings that she thinks are incorrect She states that she has been wearing current sensor for 6 days and has already had 4 low readings with the sensor  She reports that the past sensors has shown anywhere between 1 and 2 low readings during the duration of use  She does not ever have any true symptoms when her low blood sugar alarm goes off, denies dizziness, nausea, vomiting, shaking, abdominal pain, sweating, headache, blurred vision  She has not been able to check her blood sugar with a fingerstick when it has been low as she has typically prioritized getting carbohydrate into her system when these events happen  She eats meals and snacks regularly throughout the day and is very diligent with ensuring she is getting adequate protein and carbohydrate intake prior to bed   Lab Results  Component Value Date   HGBA1C 6.7 (H) 09/07/2023    Her current continuous glucose monitor is giving her low values   Past Medical History:  Diagnosis Date   Allergy    as a child and young adult   Arthritis    Oa left shoulder   Asthma    as a child and young adult   Diabetes mellitus without complication (HCC) O5-2022     Social History   Tobacco Use   Smoking status: Never   Smokeless tobacco: Never  Vaping Use   Vaping status: Never Used  Substance Use Topics   Alcohol use: No   Drug use: Never    Past Surgical History:  Procedure Laterality Date   BREAST SURGERY Right 40 (age 30)    benign breast tumors   CESAREAN SECTION     x1   COLONOSCOPY  2004   ~16 yrs ago- was normal    trigger thumb  05/2017   r hand    Family History  Problem Relation Age of Onset   Cancer Mother 33       colon- died age 41    Colon cancer Mother    COPD Father    Heart disease Father    Cancer Sister 5       breast   Breast cancer Sister    Cancer Sister 68       breast; had a lumpectomy and is on tamoxifen   Breast cancer Sister    Colon polyps Sister    Rheum arthritis Sister    Esophageal cancer Neg Hx    Rectal cancer Neg Hx    Stomach cancer Neg Hx    Diabetes Neg Hx     No Known Allergies  Current Medications:   Current Outpatient Medications:    albuterol  (VENTOLIN  HFA) 108 (90 Base) MCG/ACT inhaler, Inhale 2 puffs into the lungs every 6 (six) hours as needed for wheezing or shortness of breath., Disp: 8 g, Rfl: 0   Continuous Glucose Sensor (FREESTYLE LIBRE 3 PLUS SENSOR) MISC, Change sensor every 15 days., Disp: 2 each,  Rfl: 5   Docusate Sodium (COLACE PO), Take 1 tablet by mouth daily., Disp: , Rfl:    polyethylene glycol (MIRALAX  / GLYCOLAX ) 17 g packet, Take 17 g by mouth daily as needed for mild constipation., Disp: , Rfl:    rosuvastatin  (CRESTOR ) 10 MG tablet, Take 1 tablet (10 mg total) by mouth daily., Disp: 90 tablet, Rfl: 3   Semaglutide , 1 MG/DOSE, (OZEMPIC , 1 MG/DOSE,) 4 MG/3ML SOPN, INJECT 1 MG AS DIRECTED ONCE A WEEK, Disp: 3 mL, Rfl: 2   Review of Systems:   Negative unless otherwise specified per HPI.  Vitals:   Vitals:   09/17/23 0934  BP: 120/74  Pulse: 74  Temp: 97.9 F (36.6 C)  TempSrc: Temporal  SpO2: 95%  Weight: 142 lb (64.4 kg)  Height: 4\' 11"  (1.499 m)     Body mass index is 28.68 kg/m.  Physical Exam:   Physical Exam Constitutional:      Appearance: Normal appearance. She is well-developed.  HENT:     Head: Normocephalic and atraumatic.  Eyes:     General: Lids are normal.     Extraocular Movements: Extraocular  movements intact.     Conjunctiva/sclera: Conjunctivae normal.  Pulmonary:     Effort: Pulmonary effort is normal.  Musculoskeletal:        General: Normal range of motion.     Cervical back: Normal range of motion and neck supple.  Skin:    General: Skin is warm and dry.  Neurological:     Mental Status: She is alert and oriented to person, place, and time.  Psychiatric:        Attention and Perception: Attention and perception normal.        Mood and Affect: Mood normal.        Behavior: Behavior normal.        Thought Content: Thought content normal.        Judgment: Judgment normal.    Results for orders placed or performed in visit on 09/17/23  POCT Glucose (Device for Home Use)  Result Value Ref Range   Glucose Fasting, POC 123 (A) 70 - 99 mg/dL   POC Glucose      Assessment and Plan:   1. Type 2 diabetes mellitus without complication, without long-term current use of insulin  (HCC) (Primary) - POCT Glucose (Device for Home Use)   We checked her blood sugar in the office today and it was almost 20 points off from her glucose monitor I discussed that my initial instinct is that this specific sensor is not working properly I did ask her if she does experience another low blood sugar to try to get a fingerstick reading as well to assess if these are true low blood sugar episodes Discussed risks versus benefits of continuing to use continuous glucose monitors She would like to continue to use current continuous glucose monitor  She was also given the opportunity to decrease her Ozempic  but she does not feel like these are truly low blood sugars and does not want to do this at this time She is well versed on symptoms of hypoglycemia as well as action plan to bring blood sugar levels up quickly Continue efforts at healthy nutrition and regular meal spacing to prevent low blood sugars  Follow-up at regular scheduled interval for further evaluation, sooner if  concerns   Alexander Iba, PA-C

## 2023-12-10 ENCOUNTER — Ambulatory Visit: Payer: Self-pay | Admitting: Physician Assistant

## 2023-12-10 ENCOUNTER — Ambulatory Visit: Admitting: Physician Assistant

## 2023-12-10 ENCOUNTER — Encounter: Payer: Self-pay | Admitting: Physician Assistant

## 2023-12-10 VITALS — BP 110/70 | HR 65 | Temp 97.3°F | Ht 58.25 in | Wt 142.0 lb

## 2023-12-10 DIAGNOSIS — E2839 Other primary ovarian failure: Secondary | ICD-10-CM

## 2023-12-10 DIAGNOSIS — E1169 Type 2 diabetes mellitus with other specified complication: Secondary | ICD-10-CM | POA: Diagnosis not present

## 2023-12-10 DIAGNOSIS — E785 Hyperlipidemia, unspecified: Secondary | ICD-10-CM | POA: Diagnosis not present

## 2023-12-10 DIAGNOSIS — Z0000 Encounter for general adult medical examination without abnormal findings: Secondary | ICD-10-CM

## 2023-12-10 DIAGNOSIS — E119 Type 2 diabetes mellitus without complications: Secondary | ICD-10-CM

## 2023-12-10 DIAGNOSIS — Z7985 Long-term (current) use of injectable non-insulin antidiabetic drugs: Secondary | ICD-10-CM | POA: Diagnosis not present

## 2023-12-10 LAB — CBC WITH DIFFERENTIAL/PLATELET
Basophils Absolute: 0 K/uL (ref 0.0–0.1)
Basophils Relative: 1.1 % (ref 0.0–3.0)
Eosinophils Absolute: 0.1 K/uL (ref 0.0–0.7)
Eosinophils Relative: 2.9 % (ref 0.0–5.0)
HCT: 39.4 % (ref 36.0–46.0)
Hemoglobin: 13 g/dL (ref 12.0–15.0)
Lymphocytes Relative: 38.2 % (ref 12.0–46.0)
Lymphs Abs: 0.9 K/uL (ref 0.7–4.0)
MCHC: 33 g/dL (ref 30.0–36.0)
MCV: 81.5 fl (ref 78.0–100.0)
Monocytes Absolute: 0.3 K/uL (ref 0.1–1.0)
Monocytes Relative: 12.3 % — ABNORMAL HIGH (ref 3.0–12.0)
Neutro Abs: 1.1 K/uL — ABNORMAL LOW (ref 1.4–7.7)
Neutrophils Relative %: 45.5 % (ref 43.0–77.0)
Platelets: 173 K/uL (ref 150.0–400.0)
RBC: 4.84 Mil/uL (ref 3.87–5.11)
RDW: 14.6 % (ref 11.5–15.5)
WBC: 2.4 K/uL — ABNORMAL LOW (ref 4.0–10.5)

## 2023-12-10 LAB — COMPREHENSIVE METABOLIC PANEL WITH GFR
ALT: 25 U/L (ref 0–35)
AST: 28 U/L (ref 0–37)
Albumin: 4.2 g/dL (ref 3.5–5.2)
Alkaline Phosphatase: 74 U/L (ref 39–117)
BUN: 13 mg/dL (ref 6–23)
CO2: 24 meq/L (ref 19–32)
Calcium: 9.5 mg/dL (ref 8.4–10.5)
Chloride: 105 meq/L (ref 96–112)
Creatinine, Ser: 0.74 mg/dL (ref 0.40–1.20)
GFR: 80.93 mL/min (ref >60.00)
Glucose, Bld: 107 mg/dL — ABNORMAL HIGH (ref 70–99)
Potassium: 4.2 meq/L (ref 3.5–5.1)
Sodium: 140 meq/L (ref 135–145)
Total Bilirubin: 0.5 mg/dL (ref 0.2–1.2)
Total Protein: 7.3 g/dL (ref 6.0–8.3)

## 2023-12-10 LAB — LIPID PANEL
Cholesterol: 129 mg/dL (ref 0–200)
HDL: 48.5 mg/dL (ref >39.00)
LDL Cholesterol: 65 mg/dL (ref 0–99)
NonHDL: 80.65
Total CHOL/HDL Ratio: 3
Triglycerides: 76 mg/dL (ref 0.0–149.0)
VLDL: 15.2 mg/dL (ref 0.0–40.0)

## 2023-12-10 LAB — MICROALBUMIN / CREATININE URINE RATIO
Creatinine,U: 271.1 mg/dL
Microalb Creat Ratio: 6.7 mg/g (ref 0.0–30.0)
Microalb, Ur: 1.8 mg/dL (ref 0.0–1.9)

## 2023-12-10 LAB — HEMOGLOBIN A1C: Hgb A1c MFr Bld: 7 % — ABNORMAL HIGH (ref 4.6–6.5)

## 2023-12-10 MED ORDER — TIRZEPATIDE 5 MG/0.5ML ~~LOC~~ SOAJ: 5.0000 mg | SUBCUTANEOUS | 1 refills | Status: DC

## 2023-12-10 NOTE — Progress Notes (Signed)
 Subjective:    Lindsey Bray is a 73 y.o. female and is here for a comprehensive physical exam.  HPI  Health Maintenance Due  Topic Date Due   Diabetic kidney evaluation - Urine ACR  Never done   DEXA SCAN  11/18/2021    Discussed the use of AI scribe software for clinical note transcription with the patient, who gave verbal consent to proceed.  History of Present Illness Lindsey Bray is a 74 year old female with type 2 diabetes who presents for medication management and follow-up.  She experiences gastrointestinal issues with Ozempic  1 mg weekly, including irregular bowel movements and intermittent constipation, managed with Miralax , Colace, and occasionally Senokot, which causes stomach cramping. She is on Ozempic  1 mg weekly for diabetes management but is dissatisfied with her blood glucose levels, with fasting glucose at 140 mg/dL, despite adherence to medication, regular physical activity, and a diabetic diet.  She has intermittent sleep disturbances, often napping during the day, affecting nighttime sleep. She occasionally feels depressed with existential thoughts but remains socially active.  She experiences arm pain from overuse due to yard work and weight training, with some improvement from physical therapy, including dry needling. She lives alone, is active in church activities, and maintains a network of friends.   Health Maintenance: Immunizations -- utd Colonoscopy -- completed Aug 2020; due in 2027 Mammogram -- completed Sep 2024 PAP -- n/a Bone Density -- completed 7 2020 -- overdue Diet -- well balanced Exercise -- very regular exercise and strength training  Sleep habits -- frequently naps during the day Mood -- stable  UTD with dentist? - no UTD with eye doctor? - yes  Weight history: Wt Readings from Last 10 Encounters:  12/10/23 142 lb (64.4 kg)  09/17/23 142 lb (64.4 kg)  09/07/23 140 lb 6.1 oz (63.7 kg)  08/24/23 140 lb 12.8 oz (63.9 kg)   06/10/23 143 lb (64.9 kg)  03/16/23 146 lb (66.2 kg)  03/10/23 145 lb (65.8 kg)  02/03/23 140 lb (63.5 kg)  12/08/22 142 lb 8 oz (64.6 kg)  10/24/22 143 lb 3.2 oz (65 kg)   Body mass index is 29.42 kg/m. No LMP recorded. Patient is postmenopausal.  Alcohol use:  reports no history of alcohol use.  Tobacco use:  Tobacco Use: Low Risk  (12/10/2023)   Patient History    Smoking Tobacco Use: Never    Smokeless Tobacco Use: Never    Passive Exposure: Not on file   Eligible for lung cancer screening? no     12/10/2023    8:09 AM  Depression screen PHQ 2/9  Decreased Interest 0  Down, Depressed, Hopeless 0  PHQ - 2 Score 0     Other providers/specialists: Patient Care Team: Job Lukes, [place] as PCP - General (Physician Assistant) Sharl Selinda Dover, [place] as Consulting Physician (Orthopedic Surgery) Rox Charleston, MD as Consulting Physician (Obstetrics and Gynecology) [facility], [place] as Consulting Physician (Optometry)    PMHx, SurgHx, SocialHx, Medications, and Allergies were reviewed in the Visit Navigator and updated as appropriate.   Past Medical History:  Diagnosis Date   Allergy    as a child and young adult   Arthritis    Oa left shoulder   Asthma As a child   as a child and young adult   Diabetes mellitus without complication (HCC) O5-2022     Past Surgical History:  Procedure Laterality Date   BREAST SURGERY Right 36 (age 23)  benign breast tumors   CESAREAN SECTION     x1   COLONOSCOPY  2004   ~16 yrs ago- was normal    trigger thumb  05/2017   r hand     Family History  Problem Relation Age of Onset   Cancer Mother 57       colon- died age 16    Colon cancer Mother    COPD Father    Heart disease Father    Cancer Sister 37       breast   Breast cancer Sister    Cancer Sister 32       breast; had a lumpectomy and is on tamoxifen   Breast cancer Sister    Colon polyps Sister    Rheum arthritis Sister    Esophageal  cancer Neg Hx    Rectal cancer Neg Hx    Stomach cancer Neg Hx    Diabetes Neg Hx     Social History   Tobacco Use   Smoking status: Never   Smokeless tobacco: Never  Vaping Use   Vaping status: Never Used  Substance Use Topics   Alcohol use: No   Drug use: Never    Review of Systems:   Review of Systems  Constitutional:  Negative for chills, fever, malaise/fatigue and weight loss.  HENT:  Negative for hearing loss, sinus pain and sore throat.   Respiratory:  Negative for cough and hemoptysis.   Cardiovascular:  Negative for chest pain, palpitations, leg swelling and PND.  Gastrointestinal:  Negative for abdominal pain, constipation, diarrhea, heartburn, nausea and vomiting.  Genitourinary:  Negative for dysuria, frequency and urgency.  Musculoskeletal:  Negative for back pain, myalgias and neck pain.  Skin:  Negative for itching and rash.  Neurological:  Negative for dizziness, tingling, seizures and headaches.  Endo/Heme/Allergies:  Negative for polydipsia.  Psychiatric/Behavioral:  Negative for depression. The patient is not nervous/anxious.     Objective:   BP 110/70 (BP Location: Right Arm, Patient Position: Sitting, Cuff Size: Normal)   Pulse 65   Temp (!) 97.3 F (36.3 C) (Temporal)   Ht 4' 10.25 (1.48 m)   Wt 142 lb (64.4 kg)   SpO2 99%   BMI 29.42 kg/m  Body mass index is 29.42 kg/m.   General Appearance:    Alert, cooperative, no distress, appears stated age  Head:    Normocephalic, without obvious abnormality, atraumatic  Eyes:    PERRL, conjunctiva/corneas clear, EOM's intact, fundi    benign, both eyes  Ears:    Normal TM's and external ear canals, both ears  Nose:   Nares normal, septum midline, mucosa normal, no drainage    or sinus tenderness  Throat:   Lips, mucosa, and tongue normal; teeth and gums normal  Neck:   Supple, symmetrical, trachea midline, no adenopathy;    thyroid :  no enlargement/tenderness/nodules; no carotid   bruit or JVD   Back:     Symmetric, no curvature, ROM normal, no CVA tenderness  Lungs:     Clear to auscultation bilaterally, respirations unlabored  Chest Wall:    No tenderness or deformity   Heart:    Regular rate and rhythm, S1 and S2 normal, no murmur, rub or gallop  Breast Exam:    Deferred  Abdomen:     Soft, non-tender, bowel sounds active all four quadrants,    no masses, no organomegaly  Genitalia:    Deferred   Extremities:   Extremities normal, atraumatic, no cyanosis  or edema  Pulses:   2+ and symmetric all extremities  Skin:   Skin color, texture, turgor normal, no rashes or lesions  Lymph nodes:   Cervical, supraclavicular, and axillary nodes normal  Neurologic:   CNII-XII intact, normal strength, sensation and reflexes    throughout    Assessment/Plan:   Assessment and Plan Assessment & Plan Adult Wellness Visit Routine wellness visit. Stable mental health with occasional depression. Regular physical activity and social engagement. - Order bone density scan at [facility] - Provide advanced directives packet. - Update blood work.  Type 2 diabetes mellitus Suboptimal glucose control on Ozempic  1 mg. Interested in Mounjaro  for better glucose control and gastrointestinal side effect profile. Discussed starting Mounjaro  at 5 mg due to current tolerance of Ozempic . - Prescribe Mounjaro  5 mg, pending insurance approval. - Continue Ozempic  1 mg until Mounjaro  is approved. - Consider increasing Ozempic  to 2 mg if Mounjaro  is not approved.  Constipation Intermittent constipation managed with Colace and Miralax . Senokot causes stomach cramping, used occasionally.  Hyperlipidemia Managed with daily rosuvastatin . Adheres to medication regimen.  Overuse injury of right upper extremity Overuse injury from yard work and [employer] training. Improvement with physical therapy and dry needling.      Lucie Buttner, PA-C [facility]

## 2023-12-10 NOTE — Patient Instructions (Addendum)
 It was great to see you!  *Please schedule your bone density on your way out of the office *Lets switch to Mounjaro  5 mg - let's follow up in about 4 months to discuss this *Please update and provide copy of your Advanced Directives  Please go to the lab for blood work.   Our office will call you with your results unless you have chosen to receive results via MyChart.  If your blood work is normal we will follow-up each year for physicals and as scheduled for chronic medical problems.  If anything is abnormal we will treat accordingly and get you in for a follow-up.  Take care,  Idelle Reimann

## 2023-12-11 ENCOUNTER — Telehealth: Payer: Self-pay

## 2023-12-11 NOTE — Telephone Encounter (Signed)
 Pharmacy Patient Advocate Encounter  Received notification from [facility] [employer] that Prior Authorization for  Mounjaro  5MG /0.5ML auto-injectors  has been APPROVED from 12/11/23 to 12/10/24   PA #/Case ID/Reference #: [id number]

## 2023-12-11 NOTE — Telephone Encounter (Signed)
 Pharmacy Patient Advocate Encounter   Received notification from CoverMyMeds that prior authorization for Mounjaro  5MG /0.5ML auto-injectors is required/requested.   Insurance verification completed.   The patient is insured through [facility] ADVANTAGE/RX ADVANCE .   Per test claim: PA required; PA submitted to above mentioned insurance via CoverMyMeds Key/confirmation #/EOC [id number] Status is pending

## 2023-12-14 NOTE — Telephone Encounter (Signed)
 Noted.  Will alert patient.

## 2024-01-05 ENCOUNTER — Other Ambulatory Visit: Payer: Self-pay | Admitting: Physician Assistant

## 2024-01-05 DIAGNOSIS — E1169 Type 2 diabetes mellitus with other specified complication: Secondary | ICD-10-CM

## 2024-01-13 ENCOUNTER — Ambulatory Visit (INDEPENDENT_AMBULATORY_CARE_PROVIDER_SITE_OTHER)
Admission: RE | Admit: 2024-01-13 | Discharge: 2024-01-13 | Disposition: A | Discharge status: Home / Self Care | Source: Ambulatory Visit | Attending: Physician Assistant | Admitting: Physician Assistant

## 2024-01-13 DIAGNOSIS — E2839 Other primary ovarian failure: Secondary | ICD-10-CM | POA: Diagnosis not present

## 2024-01-14 LAB — HM MAMMOGRAPHY

## 2024-04-05 ENCOUNTER — Other Ambulatory Visit: Payer: Self-pay | Admitting: Physician Assistant

## 2024-04-11 ENCOUNTER — Ambulatory Visit: Admitting: Physician Assistant

## 2024-04-11 ENCOUNTER — Encounter: Payer: Self-pay | Admitting: Physician Assistant

## 2024-04-11 VITALS — BP 130/76 | HR 85 | Temp 98.0°F | Ht 58.25 in | Wt 142.0 lb

## 2024-04-11 DIAGNOSIS — E119 Type 2 diabetes mellitus without complications: Secondary | ICD-10-CM

## 2024-04-11 DIAGNOSIS — D709 Neutropenia, unspecified: Secondary | ICD-10-CM

## 2024-04-11 LAB — CBC WITH DIFFERENTIAL/PLATELET
Basophils Absolute: 0 K/uL (ref 0.0–0.1)
Basophils Relative: 1.2 % (ref 0.0–3.0)
Eosinophils Absolute: 0.1 K/uL (ref 0.0–0.7)
Eosinophils Relative: 4.3 % (ref 0.0–5.0)
HCT: 38.7 % (ref 36.0–46.0)
Hemoglobin: 12.9 g/dL (ref 12.0–15.0)
Lymphocytes Relative: 41.7 % (ref 12.0–46.0)
Lymphs Abs: 1.1 K/uL (ref 0.7–4.0)
MCHC: 33.2 g/dL (ref 30.0–36.0)
MCV: 80.6 fl (ref 78.0–100.0)
Monocytes Absolute: 0.4 K/uL (ref 0.1–1.0)
Monocytes Relative: 14.6 % — ABNORMAL HIGH (ref 3.0–12.0)
Neutro Abs: 1 K/uL — ABNORMAL LOW (ref 1.4–7.7)
Neutrophils Relative %: 38.2 % — ABNORMAL LOW (ref 43.0–77.0)
Platelets: 177 K/uL (ref 150.0–400.0)
RBC: 4.79 Mil/uL (ref 3.87–5.11)
RDW: 14.7 % (ref 11.5–15.5)
WBC: 2.6 K/uL — ABNORMAL LOW (ref 4.0–10.5)

## 2024-04-11 LAB — POCT GLYCOSYLATED HEMOGLOBIN (HGB A1C): Hemoglobin A1C: 6.3 % — AB (ref 4.0–5.6)

## 2024-04-11 MED ORDER — ONDANSETRON HCL 4 MG PO TABS: 4.0000 mg | ORAL_TABLET | Freq: Three times a day (TID) | ORAL | 0 refills | Status: AC | PRN

## 2024-04-11 NOTE — Patient Instructions (Addendum)
 SABRA

## 2024-04-11 NOTE — Progress Notes (Signed)
 History of Present Illness:   Chief Complaint  Patient presents with   Diabetes    Here for 4 mouth follow-up for diabetes.    Discussed the use of AI scribe software for clinical note transcription with the patient, who gave verbal consent to proceed.  History of Present Illness   Lindsey Bray is a 73 year old female with type 2 diabetes who presents for management of her diabetes and medication side effects.  Her A1c has improved from 7.0 at her last visit to 6.3. She is using Mounjaro  5 mg weekly for diabetes and prefers it over Ozempic  due to better appetite control, but she has intermittent nausea, stomach discomfort, vomiting, and diarrhea, often at night, about twice weekly, without clear food triggers.  She uses a Libre sensor and finger sticks and has noticed discrepancies between them. Her blood glucose drops at night and disrupts her sleep, though she does not have typical hypoglycemia symptoms.  Her weight is stable at 142 pounds, though she feels her home scale shows some loss.  She has chronic low white blood cell count and previously saw hematology. She takes a daily multivitamin and B complex. She has not seen hematology this year and is unsure if she missed a follow-up visit. Per most recent documentation, recommendation is to follow up with hematology if ANC is < 1.       Past Medical History:  Diagnosis Date   Allergy    as a child and young adult   Arthritis    Oa left shoulder   Asthma As a child   as a child and young adult   Diabetes mellitus without complication (HCC) O5-2022     Social History   Tobacco Use   Smoking status: Never   Smokeless tobacco: Never  Vaping Use   Vaping status: Never Used  Substance Use Topics   Alcohol use: No   Drug use: Never    Past Surgical History:  Procedure Laterality Date   BREAST SURGERY Right 64 (age 49)   benign breast tumors   CESAREAN SECTION     x1   COLONOSCOPY  2004   ~16 yrs ago- was  normal    trigger thumb  05/2017   r hand    Family History  Problem Relation Age of Onset   Cancer Mother 15       colon- died age 68    Colon cancer Mother    COPD Father    Heart disease Father    Cancer Sister 76       breast   Breast cancer Sister    Cancer Sister 58       breast; had a lumpectomy and is on tamoxifen   Breast cancer Sister    Colon polyps Sister    Rheum arthritis Sister    Esophageal cancer Neg Hx    Rectal cancer Neg Hx    Stomach cancer Neg Hx    Diabetes Neg Hx     No Known Allergies  Current Medications:   Current Outpatient Medications:    albuterol  (VENTOLIN  HFA) 108 (90 Base) MCG/ACT inhaler, Inhale 2 puffs into the lungs every 6 (six) hours as needed for wheezing or shortness of breath., Disp: 8 g, Rfl: 0   Continuous Glucose Sensor (FREESTYLE LIBRE 3 PLUS SENSOR) MISC, Change sensor every 15 days., Disp: 2 each, Rfl: 5   Docusate Sodium (COLACE PO), Take 1 tablet by mouth daily., Disp: , Rfl:  MOUNJARO  5 MG/0.5ML Pen, ADMINISTER 5 MG UNDER THE SKIN 1 TIME A WEEK, Disp: 6 mL, Rfl: 1   polyethylene glycol (MIRALAX  / GLYCOLAX ) 17 g packet, Take 17 g by mouth daily as needed for mild constipation., Disp: , Rfl:    rosuvastatin  (CRESTOR ) 10 MG tablet, TAKE 1 TABLET(10 MG) BY MOUTH DAILY, Disp: 90 tablet, Rfl: 1   Review of Systems:   Negative unless otherwise specified per HPI.  Vitals:   Vitals:   04/11/24 0758  BP: 130/76  Pulse: 85  Temp: 98 F (36.7 C)  TempSrc: Temporal  SpO2: 98%  Weight: 142 lb (64.4 kg)  Height: 4' 10.25 (1.48 m)     Body mass index is 29.42 kg/m.  Physical Exam:   Physical Exam Vitals and nursing note reviewed.  Constitutional:      General: She is not in acute distress.    Appearance: She is well-developed. She is not ill-appearing or toxic-appearing.  Cardiovascular:     Rate and Rhythm: Normal rate and regular rhythm.     Pulses: Normal pulses.     Heart sounds: Normal heart sounds, S1  normal and S2 normal.  Pulmonary:     Effort: Pulmonary effort is normal.     Breath sounds: Normal breath sounds.  Skin:    General: Skin is warm and dry.  Neurological:     Mental Status: She is alert.     GCS: GCS eye subscore is 4. GCS verbal subscore is 5. GCS motor subscore is 6.  Psychiatric:        Speech: Speech normal.        Behavior: Behavior normal. Behavior is cooperative.     Assessment and Plan:   Assessment and Plan    Type 2 diabetes mellitus managed with Mounjaro  and associated gastrointestinal side effects Type 2 diabetes is managed with Mounjaro , preferred for better glycemic control and appetite reduction. Discussed age-related hypoglycemia risks with a target A1c of 7.0-7.2. Gastrointestinal side effects include nausea, vomiting, and diarrhea with certain foods. - Continue Mounjaro  5 mg weekly for diabetes management. - Prescribed antiemetic for nausea management during travel or family events.  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 Previous hematology workup was unrevealing. Monitoring recommended with follow-up if ANC drops below 1.0. - Ordered blood test to check absolute neutrophil count. - Continue multivitamin and B complex supplementation. - Follow up with hematology if ANC is less than 1.0.         Lucie Buttner, PA-C

## 2024-04-12 ENCOUNTER — Ambulatory Visit: Payer: Self-pay | Admitting: Physician Assistant

## 2024-04-14 ENCOUNTER — Other Ambulatory Visit: Payer: Self-pay | Admitting: Physician Assistant

## 2024-04-14 DIAGNOSIS — E1169 Type 2 diabetes mellitus with other specified complication: Secondary | ICD-10-CM

## 2024-05-19 ENCOUNTER — Other Ambulatory Visit: Payer: Self-pay | Admitting: Hematology and Oncology

## 2024-05-19 ENCOUNTER — Inpatient Hospital Stay: Attending: Hematology and Oncology

## 2024-05-19 ENCOUNTER — Inpatient Hospital Stay: Admitting: Hematology and Oncology

## 2024-05-19 VITALS — BP 148/73 | HR 66 | Temp 98.1°F | Resp 13 | Wt 139.5 lb

## 2024-05-19 DIAGNOSIS — Z79899 Other long term (current) drug therapy: Secondary | ICD-10-CM | POA: Insufficient documentation

## 2024-05-19 DIAGNOSIS — Z800 Family history of malignant neoplasm of digestive organs: Secondary | ICD-10-CM | POA: Diagnosis not present

## 2024-05-19 DIAGNOSIS — E119 Type 2 diabetes mellitus without complications: Secondary | ICD-10-CM | POA: Insufficient documentation

## 2024-05-19 DIAGNOSIS — D708 Other neutropenia: Secondary | ICD-10-CM

## 2024-05-19 DIAGNOSIS — Z789 Other specified health status: Secondary | ICD-10-CM

## 2024-05-19 DIAGNOSIS — D709 Neutropenia, unspecified: Secondary | ICD-10-CM | POA: Diagnosis present

## 2024-05-19 LAB — CBC WITH DIFFERENTIAL (CANCER CENTER ONLY)
Abs Immature Granulocytes: 0.01 K/uL (ref 0.00–0.07)
Basophils Absolute: 0 K/uL (ref 0.0–0.1)
Basophils Relative: 1 %
Eosinophils Absolute: 0.1 K/uL (ref 0.0–0.5)
Eosinophils Relative: 3 %
HCT: 43.5 % (ref 36.0–46.0)
Hemoglobin: 14 g/dL (ref 12.0–15.0)
Immature Granulocytes: 0 %
Lymphocytes Relative: 33 %
Lymphs Abs: 1.3 K/uL (ref 0.7–4.0)
MCH: 26.4 pg (ref 26.0–34.0)
MCHC: 32.2 g/dL (ref 30.0–36.0)
MCV: 82.1 fL (ref 80.0–100.0)
Monocytes Absolute: 0.3 K/uL (ref 0.1–1.0)
Monocytes Relative: 9 %
Neutro Abs: 2.1 K/uL (ref 1.7–7.7)
Neutrophils Relative %: 54 %
Platelet Count: 188 K/uL (ref 150–400)
RBC: 5.3 MIL/uL — ABNORMAL HIGH (ref 3.87–5.11)
RDW: 14.6 % (ref 11.5–15.5)
WBC Count: 3.8 K/uL — ABNORMAL LOW (ref 4.0–10.5)
nRBC: 0 % (ref 0.0–0.2)

## 2024-05-19 LAB — CMP (CANCER CENTER ONLY)
ALT: 29 U/L (ref 0–44)
AST: 32 U/L (ref 15–41)
Albumin: 5 g/dL (ref 3.5–5.0)
Alkaline Phosphatase: 86 U/L (ref 38–126)
Anion gap: 11 (ref 5–15)
BUN: 14 mg/dL (ref 8–23)
CO2: 26 mmol/L (ref 22–32)
Calcium: 10.2 mg/dL (ref 8.9–10.3)
Chloride: 105 mmol/L (ref 98–111)
Creatinine: 0.74 mg/dL (ref 0.44–1.00)
GFR, Estimated: >60 mL/min
Glucose, Bld: 104 mg/dL — ABNORMAL HIGH (ref 70–99)
Potassium: 4.1 mmol/L (ref 3.5–5.1)
Sodium: 142 mmol/L (ref 135–145)
Total Bilirubin: 0.5 mg/dL (ref 0.0–1.2)
Total Protein: 8.9 g/dL — ABNORMAL HIGH (ref 6.5–8.1)

## 2024-05-19 LAB — LACTATE DEHYDROGENASE: LDH: 231 U/L (ref 105–235)

## 2024-05-19 NOTE — Progress Notes (Unsigned)
 " [facility] [phone]   [phone]  PROGRESS NOTE  Patient Care Team: Job Lukes, [place] as PCP - General (Physician Assistant) Sharl Selinda Dover, MD as Consulting Physician (Orthopedic Surgery) Rox Charleston, MD as Consulting Physician (Obstetrics and Gynecology) [facility], [place] as Consulting Physician (Optometry)  Hematological/Oncological History #Leukopenia/Neutropenia: 04/14/2021: WBC 3.9 (L), Hgb 11.3 (L), Plt 185 04/22/2021: WBC 3.2 (L), Hgb 12.7, Plt 229, ANC 1.0 (L) 12/05/2021: WBC 2.6 (L), Hgb 14.7, Plt 188, ANC 1.0 (L) 01/03/2022: Establish care with [facility] Hematology with Dr. Norleen Kidney and [place] [employer] PA-C  CHIEF COMPLAINTS/PURPOSE OF CONSULTATION:  Leukopenia/neutropenia   HISTORY OF PRESENTING ILLNESS:  Lindsey Bray 74 y.o. female returns for a follow up for neutropenia. She is unaccompanied for this visit.  Ms. Fatica reports she has been well overall with interim since our last visit.  She reports has had no major changes in her health with no hospitalizations, ER visits, or surgeries.  She is not started any new medications.  She reports that she does have some occasional bouts of nausea but no vomiting or diarrhea.  She reports that her weight has dropped down to 139 pounds from 142 pounds.  She reports that her energy levels have been good and she remains quite active.  She reports that her appetite is suppressed with her current Mounjaro  therapy.  She reports that she has had no major infections though she did have a slight cold over Christmas.  She reports that she has not recently required any antibiotic therapy and that she got over her last virus quickly.  She otherwise has been her baseline level of health with no fevers, chills, sweats, chest pain, or shortness of breath.  A full 10 point ROS is otherwise negative.  MEDICAL HISTORY:  Past Medical History:  Diagnosis Date   Allergy    as a child and  young adult   Arthritis    Oa left shoulder   Asthma As a child   as a child and young adult   Diabetes mellitus without complication (HCC) O5-2022    SURGICAL HISTORY: Past Surgical History:  Procedure Laterality Date   BREAST SURGERY Right 70 (age 70)   benign breast tumors   CESAREAN SECTION     x1   COLONOSCOPY  2004   ~16 yrs ago- was normal    trigger thumb  05/2017   r hand    SOCIAL HISTORY: Social History   Socioeconomic History   Marital status: Single    Spouse name: Not on file   Number of children: 1   Years of education: Not on file   Highest education level: Bachelor's degree (e.g., BA, AB, BS)  Occupational History   Not on file  Tobacco Use   Smoking status: Never   Smokeless tobacco: Never  Vaping Use   Vaping status: Never Used  Substance and Sexual Activity   Alcohol use: No   Drug use: Never   Sexual activity: Not Currently    Birth control/protection: None  Other Topics Concern   Not on file  Social History Narrative   Retired   Lives by self   Social Drivers of Health   Tobacco Use: Low Risk (04/11/2024)   Patient History    Smoking Tobacco Use: Never    Smokeless Tobacco Use: Never    Passive Exposure: Not on file  Financial Resource Strain: Low Risk (04/09/2024)   Overall Financial Resource Strain (CARDIA)  Difficulty of Paying Living Expenses: Not very hard  Food Insecurity: No Food Insecurity (04/09/2024)   Epic    Worried About Programme Researcher, Broadcasting/film/video in the Last Year: Never true    Ran Out of Food in the Last Year: Never true  Transportation Needs: No Transportation Needs (04/09/2024)   Epic    Lack of Transportation (Medical): No    Lack of Transportation (Non-Medical): No  Physical Activity: Sufficiently Active (04/09/2024)   Exercise Vital Sign    Days of Exercise per Week: 4 days    Minutes of Exercise per Session: 60 min  Stress: Patient Declined (04/09/2024)   [employer] of Occupational Health - Occupational  Stress Questionnaire    Feeling of Stress: Patient declined  Social Connections: Moderately Integrated (04/09/2024)   Social Connection and Isolation Panel    Frequency of Communication with Friends and Family: Three times a week    Frequency of Social Gatherings with Friends and Family: More than three times a week    Attends Religious Services: More than 4 times per year    Active Member of Clubs or Organizations: Yes    Attends Banker Meetings: More than 4 times per year    Marital Status: Never married  Intimate Partner Violence: Not At Risk (08/24/2023)   Humiliation, Afraid, Rape, and Kick questionnaire    Fear of Current or Ex-Partner: No    Emotionally Abused: No    Physically Abused: No    Sexually Abused: No  Depression (PHQ2-9): Low Risk (04/11/2024)   Depression (PHQ2-9)    PHQ-2 Score: 1  Alcohol Screen: Low Risk (08/24/2023)   Alcohol Screen    Last Alcohol Screening Score (AUDIT): 0  Housing: Low Risk (04/09/2024)   Epic    Unable to Pay for Housing in the Last Year: No    Number of Times Moved in the Last Year: 0    Homeless in the Last Year: No  Utilities: Not At Risk (08/24/2023)   AHC Utilities    Threatened with loss of utilities: No  Health Literacy: Adequate Health Literacy (08/24/2023)   B1300 Health Literacy    Frequency of need for help with medical instructions: Never    FAMILY HISTORY: Family History  Problem Relation Age of Onset   Cancer Mother 23       colon- died age 62    Colon cancer Mother    COPD Father    Heart disease Father    Cancer Sister 70       breast   Breast cancer Sister    Cancer Sister 39       breast; had a lumpectomy and is on tamoxifen   Breast cancer Sister    Colon polyps Sister    Rheum arthritis Sister    Esophageal cancer Neg Hx    Rectal cancer Neg Hx    Stomach cancer Neg Hx    Diabetes Neg Hx     ALLERGIES:  has no known allergies.  MEDICATIONS:  Current Outpatient Medications  Medication  Sig Dispense Refill   albuterol  (VENTOLIN  HFA) 108 (90 Base) MCG/ACT inhaler Inhale 2 puffs into the lungs every 6 (six) hours as needed for wheezing or shortness of breath. 8 g 0   Continuous Glucose Sensor (FREESTYLE LIBRE 3 PLUS SENSOR) MISC Change sensor every 15 days. 2 each 5   Docusate Sodium (COLACE PO) Take 1 tablet by mouth daily.     MOUNJARO  5 MG/0.5ML Pen ADMINISTER 5 MG  UNDER THE SKIN 1 TIME A WEEK 6 mL 1   ondansetron  (ZOFRAN ) 4 MG tablet Take 1 tablet (4 mg total) by mouth every 8 (eight) hours as needed for nausea or vomiting. 20 tablet 0   polyethylene glycol (MIRALAX  / GLYCOLAX ) 17 g packet Take 17 g by mouth daily as needed for mild constipation.     rosuvastatin  (CRESTOR ) 10 MG tablet TAKE 1 TABLET(10 MG) BY MOUTH DAILY 90 tablet 1   No current facility-administered medications for this visit.    REVIEW OF SYSTEMS:   Constitutional: ( - ) fevers, ( - )  chills , ( - ) night sweats Eyes: ( - ) blurriness of vision, ( - ) double vision, ( - ) watery eyes Ears, nose, mouth, throat, and face: ( - ) mucositis, ( - ) sore throat Respiratory: ( - ) cough, ( - ) dyspnea, ( - ) wheezes Cardiovascular: ( - ) palpitation, ( - ) chest discomfort, ( - ) lower extremity swelling Gastrointestinal:  ( - ) nausea, ( - ) heartburn, ( - ) change in bowel habits Skin: ( - ) abnormal skin rashes Lymphatics: ( - ) new lymphadenopathy, ( - ) easy bruising Neurological: ( - ) numbness, ( - ) tingling, ( - ) new weaknesses Behavioral/Psych: ( - ) mood change, ( - ) new changes  All other systems were reviewed with the patient and are negative.  PHYSICAL EXAMINATION: ECOG PERFORMANCE STATUS: 0 - Asymptomatic  Vitals:   05/19/24 1344 05/19/24 1345  BP: (!) 144/70 (!) 148/73  Pulse: 66   Resp: 13   Temp: 98.1 F (36.7 C)   SpO2: 100%     Filed Weights   05/19/24 1344  Weight: 139 lb 8 oz (63.3 kg)     GENERAL: well appearing female in NAD  SKIN: skin color, texture, turgor are  normal, no rashes or significant lesions EYES: conjunctiva are pink and non-injected, sclera clear LUNGS: clear to auscultation and percussion with normal breathing effort HEART: regular rate & rhythm and no murmurs and no lower extremity edema Musculoskeletal: no cyanosis of digits and no clubbing  PSYCH: alert & oriented x 3, fluent speech NEURO: no focal motor/sensory deficits  LABORATORY DATA:  I have reviewed the data as listed    Latest Ref Rng & Units 05/19/2024    1:17 PM 04/11/2024    8:18 AM 12/10/2023    8:56 AM  CBC  WBC 4.0 - 10.5 K/uL 3.8  2.6  2.4 Repeated and verified X2.   Hemoglobin 12.0 - 15.0 g/dL 85.9  87.0  86.9   Hematocrit 36.0 - 46.0 % 43.5  38.7  39.4   Platelets 150 - 400 K/uL 188  177.0  173.0        Latest Ref Rng & Units 05/19/2024    1:17 PM 12/10/2023    8:56 AM 09/07/2023    9:02 AM  CMP  Glucose 70 - 99 mg/dL 895  892  97   BUN 8 - 23 mg/dL 14  13  14    Creatinine 0.44 - 1.00 mg/dL 9.25  9.25  9.27   Sodium 135 - 145 mmol/L 142  140  141   Potassium 3.5 - 5.1 mmol/L 4.1  4.2  4.7   Chloride 98 - 111 mmol/L 105  105  106   CO2 22 - 32 mmol/L 26  24  28    Calcium  8.9 - 10.3 mg/dL 89.7  9.5  9.6   Total  Protein 6.5 - 8.1 g/dL 8.9  7.3  7.7   Total Bilirubin 0.0 - 1.2 mg/dL 0.5  0.5  0.7   Alkaline Phos 38 - 126 U/L 86  74  71   AST 15 - 41 U/L 32  28  28   ALT 0 - 44 U/L 29  25  23     ASSESSMENT & PLAN Daniell A Hohler is a 74 y.o. female returns for a follow up for neutropenia.   # Leukopenia/Neutropenia  --Neutrophil counts are oscillating. ANC level is 2.1 K/uL today.  --Labs today show white blood cell 3.8, hemoglobin 14.0, MCV 82.1, platelets 188  --Workup from 01/03/2022 was negative for hepatitis B/C, HIV and nutritional deficiencies --Encouraged to take multivitamin and B complex --Recommend to monitor for now and consider further workup if ANC is less than 1.0K/uL.  --RTC in 1 year to reassess.   #Weight management: --Patient is  struggling with weight management for her diabetes --Made referral to nutrition and diabetic education.   No orders of the defined types were placed in this encounter.   All questions were answered. The patient knows to call the clinic with any problems, questions or concerns.  I have spent a total of 25 minutes minutes of face-to-face and non-face-to-face time, preparing to see the patient, performing a medically appropriate examination, counseling and educating the patient, documenting clinical information in the electronic health record, and care coordination.   Norleen IVAR Kidney, MD Department of Hematology/Oncology [facility] Phone: [phone] Pager: [phone] Email: [email or web address]    "

## 2024-08-15 ENCOUNTER — Ambulatory Visit: Admitting: Physician Assistant

## 2024-08-29 ENCOUNTER — Ambulatory Visit

## 2025-05-23 ENCOUNTER — Inpatient Hospital Stay: Admitting: Physician Assistant

## 2025-05-23 ENCOUNTER — Inpatient Hospital Stay
